# Patient Record
Sex: Male | Born: 1955 | Race: Black or African American | Hispanic: No | Smoking: Current some day smoker
Health system: Southern US, Community
[De-identification: ages and names within clinical notes are randomized; demographics above are authoritative.]

## PROBLEM LIST (undated history)

## (undated) DIAGNOSIS — K0889 Other specified disorders of teeth and supporting structures: Secondary | ICD-10-CM

## (undated) DIAGNOSIS — I10 Essential (primary) hypertension: Secondary | ICD-10-CM

## (undated) DIAGNOSIS — F1020 Alcohol dependence, uncomplicated: Secondary | ICD-10-CM

## (undated) DIAGNOSIS — K219 Gastro-esophageal reflux disease without esophagitis: Secondary | ICD-10-CM

## (undated) DIAGNOSIS — N433 Hydrocele, unspecified: Secondary | ICD-10-CM

## (undated) DIAGNOSIS — F1910 Other psychoactive substance abuse, uncomplicated: Secondary | ICD-10-CM

## (undated) DIAGNOSIS — S129XXA Fracture of neck, unspecified, initial encounter: Secondary | ICD-10-CM

## (undated) DIAGNOSIS — Z8669 Personal history of other diseases of the nervous system and sense organs: Secondary | ICD-10-CM

## (undated) DIAGNOSIS — M1990 Unspecified osteoarthritis, unspecified site: Secondary | ICD-10-CM

## (undated) HISTORY — DX: Other psychoactive substance abuse, uncomplicated: F19.10

## (undated) HISTORY — DX: Hydrocele, unspecified: N43.3

## (undated) HISTORY — DX: Fracture of neck, unspecified, initial encounter: S12.9XXA

## (undated) HISTORY — PX: TESTICLE SURGERY: SHX794

## (undated) HISTORY — DX: Alcohol dependence, uncomplicated: F10.20

## (undated) HISTORY — PX: HEMORRHOID SURGERY: SHX153

---

## 1994-08-05 HISTORY — PX: NECK SURGERY: SHX720

## 2003-09-05 ENCOUNTER — Emergency Department (HOSPITAL_COMMUNITY): Admission: EM | Admit: 2003-09-05 | Discharge: 2003-09-05 | Payer: Self-pay | Admitting: Emergency Medicine

## 2003-09-20 ENCOUNTER — Emergency Department (HOSPITAL_COMMUNITY): Admission: EM | Admit: 2003-09-20 | Discharge: 2003-09-20 | Payer: Self-pay | Admitting: Emergency Medicine

## 2003-09-24 ENCOUNTER — Encounter: Admission: RE | Admit: 2003-09-24 | Discharge: 2003-09-24 | Payer: Self-pay | Admitting: Orthopedic Surgery

## 2003-10-12 ENCOUNTER — Ambulatory Visit (HOSPITAL_BASED_OUTPATIENT_CLINIC_OR_DEPARTMENT_OTHER): Admission: RE | Admit: 2003-10-12 | Discharge: 2003-10-12 | Payer: Self-pay | Admitting: Orthopedic Surgery

## 2003-10-12 ENCOUNTER — Ambulatory Visit (HOSPITAL_COMMUNITY): Admission: RE | Admit: 2003-10-12 | Discharge: 2003-10-12 | Payer: Self-pay | Admitting: Orthopedic Surgery

## 2006-08-30 ENCOUNTER — Emergency Department (HOSPITAL_COMMUNITY): Admission: EM | Admit: 2006-08-30 | Discharge: 2006-08-30 | Payer: Self-pay | Admitting: Emergency Medicine

## 2007-02-11 ENCOUNTER — Emergency Department (HOSPITAL_COMMUNITY): Admission: EM | Admit: 2007-02-11 | Discharge: 2007-02-11 | Payer: Self-pay | Admitting: Emergency Medicine

## 2008-01-06 ENCOUNTER — Emergency Department (HOSPITAL_COMMUNITY): Admission: EM | Admit: 2008-01-06 | Discharge: 2008-01-06 | Payer: Self-pay | Admitting: Emergency Medicine

## 2008-08-06 ENCOUNTER — Emergency Department (HOSPITAL_COMMUNITY): Admission: EM | Admit: 2008-08-06 | Discharge: 2008-08-06 | Payer: Self-pay | Admitting: Emergency Medicine

## 2008-08-19 ENCOUNTER — Ambulatory Visit (HOSPITAL_BASED_OUTPATIENT_CLINIC_OR_DEPARTMENT_OTHER): Admission: RE | Admit: 2008-08-19 | Discharge: 2008-08-19 | Payer: Self-pay | Admitting: Urology

## 2008-08-19 ENCOUNTER — Encounter (INDEPENDENT_AMBULATORY_CARE_PROVIDER_SITE_OTHER): Payer: Self-pay | Admitting: Urology

## 2009-11-22 ENCOUNTER — Emergency Department (HOSPITAL_COMMUNITY): Admission: EM | Admit: 2009-11-22 | Discharge: 2009-11-22 | Payer: Self-pay | Admitting: Emergency Medicine

## 2010-10-23 LAB — URINE MICROSCOPIC-ADD ON

## 2010-10-23 LAB — COMPREHENSIVE METABOLIC PANEL
Albumin: 3.9 g/dL (ref 3.5–5.2)
BUN: 14 mg/dL (ref 6–23)
Chloride: 108 mEq/L (ref 96–112)
Creatinine, Ser: 1.06 mg/dL (ref 0.4–1.5)
GFR calc non Af Amer: >60 mL/min (ref >60)
Glucose, Bld: 85 mg/dL (ref 70–99)
Total Bilirubin: 0.7 mg/dL (ref 0.3–1.2)

## 2010-10-23 LAB — URINALYSIS, ROUTINE W REFLEX MICROSCOPIC
Bilirubin Urine: NEGATIVE
Protein, ur: NEGATIVE mg/dL
Urobilinogen, UA: 0.2 mg/dL (ref 0.0–1.0)

## 2010-10-23 LAB — APTT: aPTT: 29 seconds (ref 24–37)

## 2010-10-23 LAB — CBC
HCT: 38.9 % — ABNORMAL LOW (ref 39.0–52.0)
MCV: 93.9 fL (ref 78.0–100.0)
Platelets: 187 10*3/uL (ref 150–400)
WBC: 4.9 10*3/uL (ref 4.0–10.5)

## 2010-10-23 LAB — DIFFERENTIAL
Basophils Absolute: 0 10*3/uL (ref 0.0–0.1)
Lymphocytes Relative: 38 % (ref 12–46)
Monocytes Absolute: 0.3 10*3/uL (ref 0.1–1.0)
Neutro Abs: 2.7 10*3/uL (ref 1.7–7.7)
Neutrophils Relative %: 54 % (ref 43–77)

## 2010-10-23 LAB — PROTIME-INR
INR: 1.01 (ref 0.00–1.49)
Prothrombin Time: 13.2 seconds (ref 11.6–15.2)

## 2010-10-23 LAB — RAPID URINE DRUG SCREEN, HOSP PERFORMED
Barbiturates: NOT DETECTED
Benzodiazepines: NOT DETECTED
Cocaine: POSITIVE — AB

## 2010-11-19 LAB — RAPID URINE DRUG SCREEN, HOSP PERFORMED: Barbiturates: NOT DETECTED

## 2010-12-18 NOTE — Op Note (Signed)
NAMELOVIE, AGRESTA                ACCOUNT NO.:  [id number]   MEDICAL RECORD NO.:  [id number]           PATIENT TYPE:   LOCATION:                                 FACILITY:   PHYSICIAN:  Ronald L. Earlene Plater, M.D.  DATE OF BIRTH:  Sep 17, 1955   DATE OF PROCEDURE:  08/19/2008  DATE OF DISCHARGE:                               OPERATIVE REPORT   DIAGNOSIS:  Large left hydrocele.   OPERATIVE PROCEDURE:  Left hydrocelectomy.   SURGEON:  Lucrezia Starch. Earlene Plater, M.D.   ANESTHESIA:  LMA.   ESTIMATED BLOOD LOSS:  15 mL.   TUBES:  1/4-inch Penrose drain.   COMPLICATIONS:  None.   INDICATIONS FOR PROCEDURE:  Mr. Johnny [name] is a very nice 55 year old white  male who presented with an enlarging left scrotal mass that had become  somewhat tender and tense.  He underwent ultrasound that revealed  bilateral hydroceles, a very small hydrocele on the right, but a large  hydrocele on the left.  The testicle appeared to be without lesions.  Understanding risks, benefits and alternatives, he has elected to  proceed with the above procedure.   PROCEDURE IN DETAIL:  The patient was placed in the supine position.  After proper LMA anesthesia, he was prepped and draped with Betadine in  a sterile fashion.  A transverse left scrotal incision was made and  sharp dissection was carried down to the tunica vaginalis through dartos  tunic.  This was incised and the straw-colored fluid was drained.  Testicle adnexa were delivered.  There were appendix epididymis and  appendix testes that were cauterized, and 2 small cysts which were  tunical cysts, and the testicle appeared to be benign.  He was noted  have a large thin hydrocele sac.  This was excised to create a skirt  around, preserving the epididymis and cord structures.  It was felt that  plication of this was probably not indicated and it was a very small  amount of hydrocele sac remaining around the edge of the testicle and  the epididymis.  Though irrigation was  performed.  Good hemostasis noted  to be present.  The testicles was noted to be viable.  It was irrigated  and placed into the scrotal sac.  A 1/4-inch Penrose drain was placed  through a separate stab incision and sutured in place with 3-0 chromic  suture.  Dartos tunic was closed then with a running locked 3-0 chromic  suture and the skin was closed with a running horizontal mattress 3-0  chromic catgut.  The wound was dressed with fluffs and scrotal support.  The patient tolerated the procedure well.  No complications.  He was  taken to the recovery room stable.  Hydrocele sac was submitted for  pathologic identification.      Ronald L. Earlene Plater, M.D.  Electronically Signed     RLD/MEDQ  D:  08/19/2008  T:  08/19/2008  Job:  [id number]

## 2010-12-21 NOTE — Op Note (Signed)
NAME:  Johnny [name], Johnny [name]                          ACCOUNT NO.:  [id number]   MEDICAL RECORD NO.:  [id number]                   PATIENT TYPE:  AMB   LOCATION:  DSC                                  FACILITY:  [facility]   PHYSICIAN:  Mila Homer. Sherlean Foot, M.D.              DATE OF BIRTH:  1956-07-29   DATE OF PROCEDURE:  10/12/2003  DATE OF DISCHARGE:                                 OPERATIVE REPORT   PREOPERATIVE DIAGNOSIS:  Right knee medial meniscus tear.   POSTOPERATIVE DIAGNOSES:  1. Right knee medial meniscus tear.  2. Plica syndrome.   PROCEDURE:  Right knee arthroscopy with partial medial meniscectomy and  plica debridement.   SURGEON:  Mila Homer. Sherlean Foot, M.D.   ANESTHESIA:  General LMA.   INDICATION FOR PROCEDURE:  The patient is a 55 year old black male with  mechanical symptoms in the knee and MRI evidence of a posterior horn medial  meniscus tear.  Informed consent was obtained.   DESCRIPTION OF PROCEDURE:  The patient was laid supine and administered  general LMA anesthesia.  The right lower extremity was prepped and draped in  the usual sterile fashion.  A #11 blade, blunt trocar, and cannula were used  to create inferolateral and inferomedial portals.  Diagnostic arthroscopy  revealed a complex oblique tear of the posterior horn of the medial  meniscus.  ACL and PCL were normal.  There was minimal chondromalacia  throughout the knee.  There was a large synovitic hypertrophied plica on the  medial side.  I performed a partial medial meniscectomy with straight and  curved basket forceps and a Biochemist, clinical.  I then went into extension  and performed a plica debridement.  I then obtained hemostasis and irrigated  the knee.  I then closed each portal with a 4-0 nylon stitch, infiltrated  with 10 mL of 0.5% Marcaine with epinephrine in each portal.  I dressed with  Xeroform dressing sponges, sterile Webril, and an Ace wrap.   COMPLICATIONS:  None.   DRAINS:  None.                                             Mila Homer. Sherlean Foot, M.D.    SDL/MEDQ  D:  10/12/2003  T:  10/13/2003  Job:  [id number]

## 2011-04-29 ENCOUNTER — Emergency Department (HOSPITAL_COMMUNITY)
Admission: EM | Admit: 2011-04-29 | Discharge: 2011-04-29 | Disposition: A | Discharge status: Home / Self Care | Payer: Medicare Other | Attending: Emergency Medicine | Admitting: Emergency Medicine

## 2011-04-29 ENCOUNTER — Emergency Department (HOSPITAL_COMMUNITY): Payer: Medicare Other

## 2011-04-29 DIAGNOSIS — R079 Chest pain, unspecified: Secondary | ICD-10-CM | POA: Insufficient documentation

## 2011-04-29 DIAGNOSIS — F17200 Nicotine dependence, unspecified, uncomplicated: Secondary | ICD-10-CM | POA: Insufficient documentation

## 2011-04-29 DIAGNOSIS — I10 Essential (primary) hypertension: Secondary | ICD-10-CM | POA: Insufficient documentation

## 2011-04-29 DIAGNOSIS — F1410 Cocaine abuse, uncomplicated: Secondary | ICD-10-CM | POA: Insufficient documentation

## 2011-04-29 DIAGNOSIS — F1010 Alcohol abuse, uncomplicated: Secondary | ICD-10-CM | POA: Insufficient documentation

## 2011-04-29 LAB — POCT I-STAT TROPONIN I: Troponin i, poc: 0.03 ng/mL (ref 0.00–0.08)

## 2011-05-08 ENCOUNTER — Emergency Department (HOSPITAL_COMMUNITY)
Admission: EM | Admit: 2011-05-08 | Discharge: 2011-05-09 | Disposition: A | Discharge status: Home / Self Care | Payer: Medicare Other | Attending: Emergency Medicine | Admitting: Emergency Medicine

## 2011-05-08 DIAGNOSIS — R29898 Other symptoms and signs involving the musculoskeletal system: Secondary | ICD-10-CM | POA: Insufficient documentation

## 2011-05-08 DIAGNOSIS — M79609 Pain in unspecified limb: Secondary | ICD-10-CM | POA: Insufficient documentation

## 2011-05-08 DIAGNOSIS — I10 Essential (primary) hypertension: Secondary | ICD-10-CM | POA: Insufficient documentation

## 2011-05-08 DIAGNOSIS — R110 Nausea: Secondary | ICD-10-CM | POA: Insufficient documentation

## 2011-05-08 DIAGNOSIS — F1010 Alcohol abuse, uncomplicated: Secondary | ICD-10-CM | POA: Insufficient documentation

## 2011-05-08 DIAGNOSIS — R071 Chest pain on breathing: Secondary | ICD-10-CM | POA: Insufficient documentation

## 2011-05-09 ENCOUNTER — Emergency Department (HOSPITAL_COMMUNITY): Payer: Medicare Other

## 2011-05-09 LAB — LIPASE, BLOOD: Lipase: 26 U/L (ref 11–59)

## 2011-05-09 LAB — CBC
MCH: 30.6 pg (ref 26.0–34.0)
MCHC: 33.3 g/dL (ref 30.0–36.0)
MCV: 91.9 fL (ref 78.0–100.0)
Platelets: 172 10*3/uL (ref 150–400)
RDW: 12.5 % (ref 11.5–15.5)

## 2011-05-09 LAB — POCT I-STAT, CHEM 8
BUN: 11 mg/dL (ref 6–23)
Calcium, Ion: 1.15 mmol/L (ref 1.12–1.32)
Chloride: 106 mEq/L (ref 96–112)
Glucose, Bld: 99 mg/dL (ref 70–99)
TCO2: 21 mmol/L (ref 0–100)

## 2011-05-09 LAB — DIFFERENTIAL
Eosinophils Absolute: 0.1 10*3/uL (ref 0.0–0.7)
Eosinophils Relative: 2 % (ref 0–5)
Lymphs Abs: 2.6 10*3/uL (ref 0.7–4.0)
Monocytes Absolute: 0.5 10*3/uL (ref 0.1–1.0)
Monocytes Relative: 9 % (ref 3–12)

## 2011-05-21 LAB — CBC
HCT: 34.5 — ABNORMAL LOW
Platelets: 188
RDW: 13.4

## 2011-05-21 LAB — DIFFERENTIAL
Lymphocytes Relative: 32
Monocytes Absolute: 0.4
Monocytes Relative: 8
Neutro Abs: 3.1

## 2011-05-21 LAB — COMPREHENSIVE METABOLIC PANEL
Albumin: 3.5
BUN: 11
Creatinine, Ser: 1.05
Potassium: 3.8
Total Protein: 6.9

## 2011-05-21 LAB — URINALYSIS, ROUTINE W REFLEX MICROSCOPIC
Glucose, UA: NEGATIVE
Leukocytes, UA: NEGATIVE
Protein, ur: 30 — AB
Specific Gravity, Urine: 1.021
pH: 5.5

## 2011-05-21 LAB — URINE MICROSCOPIC-ADD ON

## 2013-08-23 ENCOUNTER — Emergency Department (HOSPITAL_COMMUNITY)
Admission: EM | Admit: 2013-08-23 | Discharge: 2013-08-24 | Disposition: A | Discharge status: Home / Self Care | Payer: Medicare Other | Attending: Emergency Medicine | Admitting: Emergency Medicine

## 2013-08-23 ENCOUNTER — Encounter (HOSPITAL_COMMUNITY): Payer: Self-pay | Admitting: Emergency Medicine

## 2013-08-23 DIAGNOSIS — I10 Essential (primary) hypertension: Secondary | ICD-10-CM | POA: Insufficient documentation

## 2013-08-23 DIAGNOSIS — F17200 Nicotine dependence, unspecified, uncomplicated: Secondary | ICD-10-CM | POA: Insufficient documentation

## 2013-08-23 DIAGNOSIS — K2970 Gastritis, unspecified, without bleeding: Secondary | ICD-10-CM

## 2013-08-23 DIAGNOSIS — K2900 Acute gastritis without bleeding: Secondary | ICD-10-CM | POA: Insufficient documentation

## 2013-08-23 DIAGNOSIS — F1010 Alcohol abuse, uncomplicated: Secondary | ICD-10-CM | POA: Insufficient documentation

## 2013-08-23 DIAGNOSIS — R4789 Other speech disturbances: Secondary | ICD-10-CM | POA: Insufficient documentation

## 2013-08-23 DIAGNOSIS — R110 Nausea: Secondary | ICD-10-CM | POA: Insufficient documentation

## 2013-08-23 DIAGNOSIS — N433 Hydrocele, unspecified: Secondary | ICD-10-CM | POA: Insufficient documentation

## 2013-08-23 DIAGNOSIS — F1410 Cocaine abuse, uncomplicated: Secondary | ICD-10-CM | POA: Insufficient documentation

## 2013-08-23 HISTORY — DX: Essential (primary) hypertension: I10

## 2013-08-23 NOTE — ED Notes (Signed)
Per EMS, pt reports RLQ and LUQ abd pain x 6 months worsening in the past week. Pt has hx of ulcers, Pt has hx of ETOH ans Substance "crack" abuse which he admits to tonight. Pt denies nausea and vomiting. CBG 76 Pt alert x4

## 2013-08-23 NOTE — ED Notes (Signed)
PT reports multiple falls in the last 2 weeks. Pt reports falling 2 weeks ago and hitting head. Denies LOC

## 2013-08-24 ENCOUNTER — Emergency Department (HOSPITAL_COMMUNITY): Payer: Medicare Other

## 2013-08-24 LAB — CBC WITH DIFFERENTIAL/PLATELET
BASOS ABS: 0.1 10*3/uL (ref 0.0–0.1)
BASOS PCT: 1 % (ref 0–1)
Eosinophils Absolute: 0 10*3/uL (ref 0.0–0.7)
Eosinophils Relative: 0 % (ref 0–5)
HEMATOCRIT: 36 % — AB (ref 39.0–52.0)
Hemoglobin: 11.9 g/dL — ABNORMAL LOW (ref 13.0–17.0)
LYMPHS PCT: 49 % — AB (ref 12–46)
Lymphs Abs: 2.6 10*3/uL (ref 0.7–4.0)
MCH: 30.6 pg (ref 26.0–34.0)
MCHC: 33.1 g/dL (ref 30.0–36.0)
MCV: 92.5 fL (ref 78.0–100.0)
MONO ABS: 0.3 10*3/uL (ref 0.1–1.0)
Monocytes Relative: 5 % (ref 3–12)
NEUTROS ABS: 2.3 10*3/uL (ref 1.7–7.7)
Neutrophils Relative %: 45 % (ref 43–77)
PLATELETS: 209 10*3/uL (ref 150–400)
RBC: 3.89 MIL/uL — ABNORMAL LOW (ref 4.22–5.81)
RDW: 12.7 % (ref 11.5–15.5)
WBC: 5.2 10*3/uL (ref 4.0–10.5)

## 2013-08-24 LAB — URINALYSIS, ROUTINE W REFLEX MICROSCOPIC
Bilirubin Urine: NEGATIVE
GLUCOSE, UA: NEGATIVE mg/dL
Hgb urine dipstick: NEGATIVE
KETONES UR: NEGATIVE mg/dL
LEUKOCYTES UA: NEGATIVE
NITRITE: NEGATIVE
PROTEIN: NEGATIVE mg/dL
Specific Gravity, Urine: 1.005 (ref 1.005–1.030)
UROBILINOGEN UA: 0.2 mg/dL (ref 0.0–1.0)
pH: 5.5 (ref 5.0–8.0)

## 2013-08-24 LAB — COMPREHENSIVE METABOLIC PANEL
ALBUMIN: 3.6 g/dL (ref 3.5–5.2)
ALT: 10 U/L (ref 0–53)
AST: 16 U/L (ref 0–37)
Alkaline Phosphatase: 95 U/L (ref 39–117)
BILIRUBIN TOTAL: 0.3 mg/dL (ref 0.3–1.2)
BUN: 7 mg/dL (ref 6–23)
CALCIUM: 8.8 mg/dL (ref 8.4–10.5)
CHLORIDE: 105 meq/L (ref 96–112)
CO2: 24 mEq/L (ref 19–32)
CREATININE: 0.86 mg/dL (ref 0.50–1.35)
GFR calc Af Amer: >90 mL/min (ref >90)
GFR calc non Af Amer: >90 mL/min (ref >90)
Glucose, Bld: 93 mg/dL (ref 70–99)
Potassium: 4.2 mEq/L (ref 3.7–5.3)
SODIUM: 144 meq/L (ref 137–147)
Total Protein: 7.8 g/dL (ref 6.0–8.3)

## 2013-08-24 LAB — LIPASE, BLOOD: Lipase: 20 U/L (ref 11–59)

## 2013-08-24 LAB — LACTIC ACID, PLASMA: LACTIC ACID, VENOUS: 1.6 mmol/L (ref 0.5–2.2)

## 2013-08-24 MED ORDER — RANITIDINE HCL 150 MG PO TABS: 150.0000 mg | ORAL_TABLET | Freq: Every day | ORAL | Status: DC

## 2013-08-24 MED ORDER — GI COCKTAIL ~~LOC~~
30.0000 mL | Freq: Once | ORAL | Status: AC
  Administered 2013-08-24: 30 mL via ORAL
  Filled 2013-08-24: qty 30

## 2013-08-24 MED ORDER — SODIUM CHLORIDE 0.9 % IV BOLUS (SEPSIS)
1000.0000 mL | Freq: Once | INTRAVENOUS | Status: AC
  Administered 2013-08-24: 1000 mL via INTRAVENOUS

## 2013-08-24 MED ORDER — IOHEXOL 300 MG/ML  SOLN
80.0000 mL | Freq: Once | INTRAMUSCULAR | Status: AC | PRN
  Administered 2013-08-24: 80 mL via INTRAVENOUS

## 2013-08-24 MED ORDER — IOHEXOL 300 MG/ML  SOLN: 25.0000 mL | Freq: Once | INTRAMUSCULAR | Status: DC | PRN

## 2013-08-24 NOTE — ED Notes (Signed)
Pt given d/c instructions and verbalized understanding. NAD at this time.  

## 2013-08-24 NOTE — ED Notes (Signed)
Pt transported to CT scan.

## 2013-08-24 NOTE — ED Notes (Signed)
Pt returned from ct

## 2013-08-24 NOTE — ED Provider Notes (Signed)
CSN: [id number]     Arrival date & time 08/23/13  2317 History   First MD Initiated Contact with Patient 08/23/13 2356     Chief Complaint  Patient presents with  . Abdominal Pain   (Consider location/radiation/quality/duration/timing/severity/associated sxs/prior Treatment) Patient is a 58 y.o. male presenting with abdominal pain.  Abdominal Pain Pain location:  Epigastric and RLQ Pain quality: sharp   Pain radiates to:  Does not radiate Pain severity:  Severe Onset quality:  Gradual Duration: 6 months, worse tonight. Timing:  Constant Progression:  Worsening Chronicity:  Chronic Context comment:  Alcohol and cocaine use Relieved by:  Nothing Worsened by:  Movement and palpation Ineffective treatments: "pain pills" Associated symptoms: nausea   Associated symptoms: no chest pain, no cough, no diarrhea, no fever, no shortness of breath and no vomiting     Past Medical History  Diagnosis Date  . Hypertension    Past Surgical History  Procedure Laterality Date  . Neck surgery  1996  . Testicle surgery     No family history on file. History  Substance Use Topics  . Smoking status: Current Every Day Smoker  . Smokeless tobacco: Not on file  . Alcohol Use: 50.4 oz/week    84 Cans of beer per week    Review of Systems  Constitutional: Negative for fever.  HENT: Negative for congestion.   Respiratory: Negative for cough and shortness of breath.   Cardiovascular: Negative for chest pain.  Gastrointestinal: Positive for nausea and abdominal pain. Negative for vomiting and diarrhea.  All other systems reviewed and are negative.    Allergies  Review of patient's allergies indicates no known allergies.  Home Medications   Current Outpatient Rx  Name  Route  Sig  Dispense  Refill  . Acetaminophen (PAIN RELIEF PO)   Oral   Take 3-4 tablets by mouth daily as needed (body pain).          BP 154/93  Pulse 94  Temp(Src) 97.9 F (36.6 C) (Oral)  Resp 12  Ht 6'  1" (1.854 m)  Wt 175 lb (79.379 kg)  BMI 23.09 kg/m2  SpO2 97% Physical Exam  Nursing note and vitals reviewed. Constitutional: He is oriented to person, place, and time. He appears well-developed and well-nourished. No distress.  HENT:  Head: Normocephalic and atraumatic.  Mouth/Throat: Oropharynx is clear and moist.  Eyes: Conjunctivae are normal. Pupils are equal, round, and reactive to light. No scleral icterus.  Neck: Neck supple.  Cardiovascular: Normal rate, regular rhythm, normal heart sounds and intact distal pulses.   No murmur heard. Pulmonary/Chest: Effort normal and breath sounds normal. No stridor. No respiratory distress. He has no wheezes. He has no rales.  Abdominal: Soft. He exhibits no distension. There is tenderness in the right upper quadrant, epigastric area and left upper quadrant. Hernia confirmed negative in the right inguinal area and confirmed negative in the left inguinal area.  Genitourinary:    Right testis shows swelling. Right testis shows no tenderness. Left testis shows no swelling and no tenderness. Circumcised. No discharge found.  Musculoskeletal: Normal range of motion. He exhibits no edema.  Neurological: He is alert and oriented to person, place, and time.  Skin: Skin is warm and dry. No rash noted.  Psychiatric: He has a normal mood and affect. His behavior is normal. His speech is slurred (slight).    ED Course  Procedures (including critical care time) Labs Review Labs Reviewed  CBC WITH DIFFERENTIAL - Abnormal; Notable for  the following:    RBC 3.89 (*)    Hemoglobin 11.9 (*)    HCT 36.0 (*)    Lymphocytes Relative 49 (*)    All other components within normal limits  URINALYSIS, ROUTINE W REFLEX MICROSCOPIC  COMPREHENSIVE METABOLIC PANEL  LIPASE, BLOOD  LACTIC ACID, PLASMA   Imaging Review Ct Abdomen Pelvis W Contrast  08/24/2013   CLINICAL DATA:  Right lower quadrant and left upper quadrant abdominal pain. Multiple falls.  EXAM:  CT ABDOMEN AND PELVIS WITH CONTRAST  TECHNIQUE: Multidetector CT imaging of the abdomen and pelvis was performed using the standard protocol following bolus administration of intravenous contrast.  CONTRAST:  80mL OMNIPAQUE IOHEXOL 300 MG/ML  SOLN  COMPARISON:  CT of the abdomen and pelvis performed 11/22/2009  FINDINGS: Minimal bibasilar atelectasis is noted.  The liver and spleen are unremarkable in appearance. The gallbladder is within normal limits. The pancreas and adrenal glands are unremarkable.  The kidneys are unremarkable in appearance. There is no evidence of hydronephrosis. No renal or ureteral stones are seen. No perinephric stranding is appreciated.  No free fluid is identified. The small bowel is unremarkable in appearance. The stomach is within normal limits. No acute vascular abnormalities are seen.  The appendix is normal in caliber and contains air without evidence for appendicitis. Minimal diverticulosis is noted along the descending colon, without evidence of diverticulitis.  The bladder is mildly distended and grossly unremarkable. The prostate is mildly enlarged, measuring 5.3 cm in transverse dimension. No inguinal lymphadenopathy is seen. A relatively large right-sided hydrocele is seen.  No acute osseous abnormalities are identified. Lateral osteophytes are seen along the lower thoracic and lumbar spine.  IMPRESSION: 1. No acute abnormality seen within the abdomen or pelvis. 2. Minimal diverticulosis along the descending colon, without evidence of diverticulitis. 3. Mildly enlarged prostate. 4. Relatively large right-sided hydrocele seen.   Electronically Signed   By: Roanna Raider M.D.   On: 08/24/2013 03:05  All radiology studies independently viewed by me.      EKG Interpretation   None       MDM   1. Gastritis   2. Hydrocele    58 yo male with hx of alcohol abuse presenting with pain in multiple sites which by limited history is chornic, but worse.  Pain located in right  groin and epigastrium primarily.  On exam, tender in epigastrium. No R/R/G.  Has a soft mass in right hemiscrotum.  Possibly a varicocele (has hx of left varicocele for which he had surgery 5 years ago).  Mass not tender.  Less likely hernia.  Plan CT and GI cocktail, labs.    Patient stated he felt much better after GI cocktail. Scrotal mass appears to be a hydrocele. Unclear if this is the cause of his chronic right inguinal pain. Will refer to urology. Will also treat for gastritis with Zantac. Advised he discontinue alcohol.  Candyce Churn, MD 08/24/13 [phone]

## 2013-08-24 NOTE — Discharge Instructions (Signed)
Gastritis, Adult Gastritis is soreness and swelling (inflammation) of the lining of the stomach. Gastritis can develop as a sudden onset (acute) or long-term (chronic) condition. If gastritis is not treated, it can lead to stomach bleeding and ulcers. CAUSES  Gastritis occurs when the stomach lining is weak or damaged. Digestive juices from the stomach then inflame the weakened stomach lining. The stomach lining may be weak or damaged due to viral or bacterial infections. One common bacterial infection is the Helicobacter pylori infection. Gastritis can also result from excessive alcohol consumption, taking certain medicines, or having too much acid in the stomach.  SYMPTOMS  In some cases, there are no symptoms. When symptoms are present, they may include:  Pain or a burning sensation in the upper abdomen.  Nausea.  Vomiting.  An uncomfortable feeling of fullness after eating. DIAGNOSIS  Your caregiver may suspect you have gastritis based on your symptoms and a physical exam. To determine the cause of your gastritis, your caregiver may perform the following:  Blood or stool tests to check for the H pylori bacterium.  Gastroscopy. A thin, flexible tube (endoscope) is passed down the esophagus and into the stomach. The endoscope has a light and camera on the end. Your caregiver uses the endoscope to view the inside of the stomach.  Taking a tissue sample (biopsy) from the stomach to examine under a microscope. TREATMENT  Depending on the cause of your gastritis, medicines may be prescribed. If you have a bacterial infection, such as an H pylori infection, antibiotics may be given. If your gastritis is caused by too much acid in the stomach, H2 blockers or antacids may be given. Your caregiver may recommend that you stop taking aspirin, ibuprofen, or other nonsteroidal anti-inflammatory drugs (NSAIDs). HOME CARE INSTRUCTIONS  Only take over-the-counter or prescription medicines as directed by  your caregiver.  If you were given antibiotic medicines, take them as directed. Finish them even if you start to feel better.  Drink enough fluids to keep your urine clear or pale yellow.  Avoid foods and drinks that make your symptoms worse, such as:  Caffeine or alcoholic drinks.  Chocolate.  Peppermint or mint flavorings.  Garlic and onions.  Spicy foods.  Citrus fruits, such as oranges, lemons, or limes.  Tomato-based foods such as sauce, chili, salsa, and pizza.  Fried and fatty foods.  Eat small, frequent meals instead of large meals. SEEK IMMEDIATE MEDICAL CARE IF:   You have black or dark red stools.  You vomit blood or material that looks like coffee grounds.  You are unable to keep fluids down.  Your abdominal pain gets worse.  You have a fever.  You do not feel better after 1 week.  You have any other questions or concerns. MAKE SURE YOU:  Understand these instructions.  Will watch your condition.  Will get help right away if you are not doing well or get worse. Document Released: 07/16/2001 Document Revised: 01/21/2012 Document Reviewed: 09/04/2011 [facility] Patient Information 2014 [place], [place].  Hydrocele, Adult Fluid can collect around the testicles. This fluid forms in a sac. This condition is called a hydrocele. The collected fluid causes swelling of the scrotum. Usually, it affects just one testicle. Most of the time, the condition does not cause pain. Sometimes, the hydrocele goes away on its own. Other times, surgery is needed to get rid of the fluid. CAUSES A hydrocele does not develop often. Different things can cause a hydrocele in a man, including:  Injury to  the scrotum.  Infection.  X-ray of the area around the scrotum.  A tumor or cancer of the testicle.  Twisting of a testicle.  Decreased blood flow to the scrotum. SYMPTOMS   Swelling without pain. The hydrocele feels like a water-filled balloon.  Swelling with pain.  This can occur if the hydrocele was caused by infection or twisting.  Mild discomfort in the scrotum.  The hydrocele may feel heavy.  Swelling that gets smaller when you lie down. DIAGNOSIS  Your caregiver will do a physical exam to decide if you have a hydrocele. This may include:  Asking questions about your overall health, today and in the past. Your caregiver may ask about any injuries, X-rays, or infections.  Pushing on your abdomen or asking you to change positions to see if the size of the hydrocele changes.  Shining a light through the scrotum (transillumination) to see if the fluid inside the scrotum is clear.  Blood tests and urine tests to check for infection.  Imaging studies that take pictures of the scrotum and testicles. TREATMENT  Treatment depends in part on what caused the condition. Options include:  Watchful waiting. Your caregiver checks the hydrocele every so often.  Different surgeries to drain the fluid.  A needle may be put into the scrotum to drain fluid (needle aspiration). Fluid often returns after this type of treatment.  A cut (incision) may be made in the scrotum to remove the fluid sac (hydrocelectomy).  An incision may be made in the groin to repair a hydrocele that has contact with abdominal fluids (communicating hydrocele).  Medicines to treat an infection (antibiotics). HOME CARE INSTRUCTIONS  What you need to do at home may depend on the cause of the hydrocele and type of treatment. In general:  Take all medicine as directed by your caregiver. Follow the directions carefully.  Ask your caregiver if there is anything you should not do while you recover (activities, lifting, work, sex).  If you had surgery to repair a communicating hydrocele, recovery time may vary. Ask you caregiver about your recovery time.  Avoid heavy lifting for 4 to 6 weeks.  If you had an incision on the scrotum or groin, wash it for 2 to 3 days after surgery. Do  this as long as the skin is closed and there are no gaps in the wound. Wash gently, and avoid rubbing the incision.  Keep all follow-up appointments. SEEK MEDICAL CARE IF:   Your scrotum seems to be getting larger.  The area becomes more and more uncomfortable. SEEK IMMEDIATE MEDICAL CARE IF:  You have a fever. Document Released: 01/09/2010 Document Revised: 05/12/2013 Document Reviewed: 01/09/2010 [facility] Patient Information 2014 [place], [place].

## 2013-09-07 ENCOUNTER — Encounter: Payer: Self-pay | Admitting: Gastroenterology

## 2013-10-04 ENCOUNTER — Encounter: Payer: Self-pay | Admitting: Gastroenterology

## 2013-10-04 ENCOUNTER — Ambulatory Visit (INDEPENDENT_AMBULATORY_CARE_PROVIDER_SITE_OTHER): Payer: Medicare Other | Admitting: Gastroenterology

## 2013-10-04 VITALS — BP 170/90 | HR 82 | Ht 74.0 in | Wt 188.0 lb

## 2013-10-04 DIAGNOSIS — Z1211 Encounter for screening for malignant neoplasm of colon: Secondary | ICD-10-CM

## 2013-10-04 DIAGNOSIS — R109 Unspecified abdominal pain: Secondary | ICD-10-CM

## 2013-10-04 DIAGNOSIS — F1910 Other psychoactive substance abuse, uncomplicated: Secondary | ICD-10-CM

## 2013-10-04 MED ORDER — MOVIPREP 100 G PO SOLR: 1.0000 | Freq: Once | ORAL | Status: DC

## 2013-10-04 NOTE — Progress Notes (Signed)
HPI: This is a  very pleasant 58 year old man who is an alcoholic and crack cocaine addict  He was in the emergency room last month with right groin pain left upper quadrant pain, back pain. Get imaging studies, see the summary below. He had lab tests, see the summary below.  He was referred to see a urologist as well as myself.  Has not seen urologist yet for the hydrocele.  Had hydrocele repair (left side) about 5 years ago.  He has pain in his back  Does not have abd pains except below his belt.    He tends to be constipated.  Had hemorrhoid surgery a long time ago.  No real nausea or vomiting.  He has lost 20 pounds.  Never had colon cancer screening.  His great uncle had colon cancer.   Labs 08/2013 (ER visit): cbc showed Hb 11.9, otherwise CMET, lipase, amylase, UA were all normal.  CT 08/2013 done for RLQ and LUS pain, multiple falls (ER visit): 1. No acute abnormality seen within the abdomen or pelvis.  2. Minimal diverticulosis along the descending colon, without  evidence of diverticulitis.  3. Mildly enlarged prostate.  4. Relatively large right-sided hydrocele seen.   Smokes crack, even just a couple days ago.    Review of systems: Pertinent positive and negative review of systems were noted in the above HPI section. Complete review of systems was performed and was otherwise normal.    Past Medical History  Diagnosis Date  . Hypertension   . Alcoholism   . Drug abuse     crack  . Hydrocele   . Broken neck     Past Surgical History  Procedure Laterality Date  . Neck surgery  1996  . Testicle surgery      Current Outpatient Prescriptions  Medication Sig Dispense Refill  . Acetaminophen (PAIN RELIEF PO) Take 3-4 tablets by mouth daily as needed (body pain).      . ranitidine (ZANTAC) 150 MG tablet Take 1 tablet (150 mg total) by mouth at bedtime.  30 tablet  2   No current facility-administered medications for this visit.    Allergies as of  10/04/2013  . (No Known Allergies)    Family History  Problem Relation Age of Onset  . Colon cancer Paternal Uncle   . Heart disease Mother     father    History   Social History  . Marital Status: Divorced    Spouse Name: N/A    Number of Children: N/A  . Years of Education: N/A   Occupational History  . Not on file.   Social History Main Topics  . Smoking status: Current Every Day Smoker  . Smokeless tobacco: Not on file  . Alcohol Use: 50.4 oz/week    84 Cans of beer per week  . Drug Use: Yes     Comment: "crack"  . Sexual Activity: Not on file   Other Topics Concern  . Not on file   Social History Narrative  . No narrative on file       Physical Exam: BP 170/90  Pulse 82  Ht 6\' 2"  (1.88 m)  Wt 188 lb (85.276 kg)  BMI 24.13 kg/m2 Constitutional: generally well-appearing Psychiatric: alert and oriented x3 Eyes: extraocular movements intact Mouth: oral pharynx moist, no lesions Neck: supple no lymphadenopathy Cardiovascular: heart regular rate and rhythm Lungs: clear to auscultation bilaterally Abdomen: soft, nontender, nondistended, no obvious ascites, no peritoneal signs, normal bowel sounds Extremities: no lower  extremity edema bilaterally Skin: no lesions on visible extremities    Assessment and plan: 58 y.o. male with  intermittent abdominal pain, right hydrocele, polysubstance abuse  He smokes cigarettes, drinks about a 12 pack of beer day and intermittently smokes crack cocaine. I explained to him that these are his top health problems. We are writing down some phone numbers for him to help with his substance abuse issues. The abdominal pains that landed him in the emergency room seemed to improve. Today, he really doesn't have abdominal pains but is still bothered by intermittent right groin pain probably from his right hydrocele, also back pain. He moves his bowels regularly and does not really have any specific GI complaints. I offered him  colon cancer screening with colonoscopy and he was indeed interested in that and so we will proceed at his soonest convenience. I did explain to him that his major health concerns are his addictions and his smoking.

## 2013-10-04 NOTE — Patient Instructions (Addendum)
One of your biggest health concerns is your smoking.  This increases your risk for most cancers and serious cardiovascular diseases such as strokes, heart attacks.  You should try your best to stop.  If you need assistance, please contact your PCP or [facility]ess[facility] [phone]) or [facility] (1-800-QUIT-NOW). You drink too much alcohol, this may be causing or contributing to a lot of your symptoms and it could cause problems such as cirrhosis in the future. You need to try to cut back or quit altogether. You need to stop doing illicit drugs such as crack cocaine. We will look into resources, phone numbers for you to call to help with this. You will be set up for a colonoscopy for colon cancer screening.

## 2013-10-15 ENCOUNTER — Emergency Department (HOSPITAL_COMMUNITY)
Admission: EM | Admit: 2013-10-15 | Discharge: 2013-10-15 | Disposition: A | Discharge status: Home / Self Care | Payer: Medicare Other | Attending: Emergency Medicine | Admitting: Emergency Medicine

## 2013-10-15 ENCOUNTER — Ambulatory Visit: Payer: Medicare Other | Admitting: Gastroenterology

## 2013-10-15 ENCOUNTER — Emergency Department (HOSPITAL_COMMUNITY): Payer: Medicare Other

## 2013-10-15 ENCOUNTER — Encounter (HOSPITAL_COMMUNITY): Payer: Self-pay | Admitting: Emergency Medicine

## 2013-10-15 VITALS — Temp 97.7°F

## 2013-10-15 DIAGNOSIS — F1021 Alcohol dependence, in remission: Secondary | ICD-10-CM | POA: Insufficient documentation

## 2013-10-15 DIAGNOSIS — I10 Essential (primary) hypertension: Secondary | ICD-10-CM | POA: Insufficient documentation

## 2013-10-15 DIAGNOSIS — R51 Headache: Secondary | ICD-10-CM | POA: Insufficient documentation

## 2013-10-15 DIAGNOSIS — R42 Dizziness and giddiness: Secondary | ICD-10-CM | POA: Insufficient documentation

## 2013-10-15 DIAGNOSIS — H9319 Tinnitus, unspecified ear: Secondary | ICD-10-CM | POA: Insufficient documentation

## 2013-10-15 DIAGNOSIS — Z87448 Personal history of other diseases of urinary system: Secondary | ICD-10-CM | POA: Insufficient documentation

## 2013-10-15 DIAGNOSIS — F17200 Nicotine dependence, unspecified, uncomplicated: Secondary | ICD-10-CM | POA: Insufficient documentation

## 2013-10-15 DIAGNOSIS — Z8781 Personal history of (healed) traumatic fracture: Secondary | ICD-10-CM | POA: Insufficient documentation

## 2013-10-15 LAB — URINALYSIS, ROUTINE W REFLEX MICROSCOPIC
BILIRUBIN URINE: NEGATIVE
GLUCOSE, UA: NEGATIVE mg/dL
HGB URINE DIPSTICK: NEGATIVE
Ketones, ur: 15 mg/dL — AB
Leukocytes, UA: NEGATIVE
Nitrite: NEGATIVE
PROTEIN: NEGATIVE mg/dL
Specific Gravity, Urine: 1.026 (ref 1.005–1.030)
Urobilinogen, UA: 0.2 mg/dL (ref 0.0–1.0)
pH: 5 (ref 5.0–8.0)

## 2013-10-15 LAB — COMPREHENSIVE METABOLIC PANEL
ALBUMIN: 4.3 g/dL (ref 3.5–5.2)
ALT: 12 U/L (ref 0–53)
AST: 19 U/L (ref 0–37)
Alkaline Phosphatase: 120 U/L — ABNORMAL HIGH (ref 39–117)
BILIRUBIN TOTAL: 0.5 mg/dL (ref 0.3–1.2)
BUN: 11 mg/dL (ref 6–23)
CHLORIDE: 100 meq/L (ref 96–112)
CO2: 23 mEq/L (ref 19–32)
CREATININE: 1.05 mg/dL (ref 0.50–1.35)
Calcium: 9.5 mg/dL (ref 8.4–10.5)
GFR, EST AFRICAN AMERICAN: 88 mL/min — AB (ref >90)
GFR, EST NON AFRICAN AMERICAN: 76 mL/min — AB (ref >90)
Glucose, Bld: 79 mg/dL (ref 70–99)
Potassium: 4.2 mEq/L (ref 3.7–5.3)
Sodium: 138 mEq/L (ref 137–147)
Total Protein: 8.4 g/dL — ABNORMAL HIGH (ref 6.0–8.3)

## 2013-10-15 LAB — CBC WITH DIFFERENTIAL/PLATELET
Basophils Absolute: 0 10*3/uL (ref 0.0–0.1)
Basophils Relative: 1 % (ref 0–1)
EOS PCT: 0 % (ref 0–5)
Eosinophils Absolute: 0 10*3/uL (ref 0.0–0.7)
HEMATOCRIT: 39.9 % (ref 39.0–52.0)
Hemoglobin: 13.2 g/dL (ref 13.0–17.0)
LYMPHS ABS: 1.6 10*3/uL (ref 0.7–4.0)
Lymphocytes Relative: 31 % (ref 12–46)
MCH: 31.1 pg (ref 26.0–34.0)
MCHC: 33.1 g/dL (ref 30.0–36.0)
MCV: 93.9 fL (ref 78.0–100.0)
MONO ABS: 0.3 10*3/uL (ref 0.1–1.0)
Monocytes Relative: 6 % (ref 3–12)
Neutro Abs: 3.3 10*3/uL (ref 1.7–7.7)
Neutrophils Relative %: 62 % (ref 43–77)
Platelets: 220 10*3/uL (ref 150–400)
RBC: 4.25 MIL/uL (ref 4.22–5.81)
RDW: 12.9 % (ref 11.5–15.5)
WBC: 5.3 10*3/uL (ref 4.0–10.5)

## 2013-10-15 LAB — I-STAT TROPONIN, ED: TROPONIN I, POC: 0 ng/mL (ref 0.00–0.08)

## 2013-10-15 MED ORDER — ACETAMINOPHEN 500 MG PO TABS
1000.0000 mg | ORAL_TABLET | Freq: Once | ORAL | Status: AC
  Administered 2013-10-15: 1000 mg via ORAL
  Filled 2013-10-15: qty 2

## 2013-10-15 MED ORDER — SODIUM CHLORIDE 0.9 % IV SOLN: 500.0000 mL | INTRAVENOUS | Status: DC

## 2013-10-15 NOTE — Progress Notes (Signed)
Smoked crack cocaine last night.  Hypertensive today.  Procedure cancelled.

## 2013-10-15 NOTE — Discharge Instructions (Signed)
Hypertension As your heart beats, it forces blood through your arteries. This force is your blood pressure. If the pressure is too high, it is called hypertension (HTN) or high blood pressure. HTN is dangerous because you may have it and not know it. High blood pressure may mean that your heart has to work harder to pump blood. Your arteries may be narrow or stiff. The extra work puts you at risk for heart disease, stroke, and other problems.  Blood pressure consists of two numbers, a higher number over a lower, 110/72, for example. It is stated as "110 over 72." The ideal is below 120 for the top number (systolic) and under 80 for the bottom (diastolic). Write down your blood pressure today. You should pay close attention to your blood pressure if you have certain conditions such as:  Heart failure.  Prior heart attack.  Diabetes  Chronic kidney disease.  Prior stroke.  Multiple risk factors for heart disease. To see if you have HTN, your blood pressure should be measured while you are seated with your arm held at the level of the heart. It should be measured at least twice. A one-time elevated blood pressure reading (especially in the Emergency Department) does not mean that you need treatment. There may be conditions in which the blood pressure is different between your right and left arms. It is important to see your caregiver soon for a recheck. Most people have essential hypertension which means that there is not a specific cause. This type of high blood pressure may be lowered by changing lifestyle factors such as:  Stress.  Smoking.  Lack of exercise.  Excessive weight.  Drug/tobacco/alcohol use.  Eating less salt. Most people do not have symptoms from high blood pressure until it has caused damage to the body. Effective treatment can often prevent, delay or reduce that damage. TREATMENT  When a cause has been identified, treatment for high blood pressure is directed at the  cause. There are a large number of medications to treat HTN. These fall into several categories, and your caregiver will help you select the medicines that are best for you. Medications may have side effects. You should review side effects with your caregiver. If your blood pressure stays high after you have made lifestyle changes or started on medicines,   Your medication(s) may need to be changed.  Other problems may need to be addressed.  Be certain you understand your prescriptions, and know how and when to take your medicine.  Be sure to follow up with your caregiver within the time frame advised (usually within two weeks) to have your blood pressure rechecked and to review your medications.  If you are taking more than one medicine to lower your blood pressure, make sure you know how and at what times they should be taken. Taking two medicines at the same time can result in blood pressure that is too low. SEEK IMMEDIATE MEDICAL CARE IF:  You develop a severe headache, blurred or changing vision, or confusion.  You have unusual weakness or numbness, or a faint feeling.  You have severe chest or abdominal pain, vomiting, or breathing problems. MAKE SURE YOU:   Understand these instructions.  Will watch your condition.  Will get help right away if you are not doing well or get worse. Document Released: 07/22/2005 Document Revised: 10/14/2011 Document Reviewed: 03/11/2008 [facility] Patient Information 2014 [place].   Emergency Department Resource Guide 1) Find a Doctor and Pay Out of Pocket Although  you won't have to find out who is covered by your insurance plan, it is a good idea to ask around and get recommendations. You will then need to call the office and see if the doctor you have chosen will accept you as a new patient and what types of options they offer for patients who are self-pay. Some doctors offer discounts or will set up payment plans for their patients who do  not have insurance, but you will need to ask so you aren't surprised when you get to your appointment.  2) Contact Your Local Health Department Not all health departments have doctors that can see patients for sick visits, but many do, so it is worth a call to see if yours does. If you don't know where your local health department is, you can check in your phone book. The CDC also has a tool to help you locate your state's health department, and many state websites also have listings of all of their local health departments.  3) Find a [facility] If your illness is not likely to be very severe or complicated, you may want to try a walk in clinic. These are popping up all over the country in pharmacies, drugstores, and shopping centers. They're usually staffed by nurse practitioners or physician assistants that have been trained to treat common illnesses and complaints. They're usually fairly quick and inexpensive. However, if you have serious medical issues or chronic medical problems, these are probably not your best option.  No Primary Care Doctor: - Call Health Connect at  [phone] - they can help you locate a primary care doctor that  accepts your insurance, provides certain services, etc. - Physician Referral Service- [phone]  Chronic Pain Problems: Organization         Address  Phone   Notes  [facility]  [phone] Patients need to be referred by their primary care doctor.   Medication Assistance: Organization         Address  Phone   Notes  [facility] Medication [facility] [place], [place] [address] [phone] --Must be a resident of [facility] -- Must have NO insurance coverage whatsoever (no Medicaid/ Medicare, etc.) -- The pt. MUST have a primary care doctor that directs their care regularly and follows them in the community   [facility]ist  [phone]   [employer]  [phone]    Agencies that  provide inexpensive medical care: Organization         Address  Phone   Notes  [place]  [phone]   [facility]    [phone]   [facility] [place],  [phone]   [place] [address] [place] [phone]   Planned Parenthood    [phone]   [facility]    [phone]   [place] and [address], [place] Phone:  [phone], Fax:  [phone] Hours of Operation:  9 am - 6 pm, M-F.  Also accepts Medicaid/Medicare and self-pay.  [facility] for [address], [address], [place] Phone: [phone], Fax: [phone]. Hours of Operation:  8:30 am - 5:30 pm, M-F.  Also accepts Medicaid and self-pay.  [facility] [place] [address], [place] Phone: [phone]   [place]  [address], [place], [place] [phone], Ext. [phone] Mondays & Thursdays: 7-9 AM.  First 15 patients are seen on a first come, first serve basis. °  ° °Medicaid-accepting [place] Providers: ° °Organization         Address  Phone   Notes  °[name] [address], [place] [phone] Also accepts self-pay patients.  °[facility] [address], [place] ° ([phone]) [phone]   °[facility] [address], [place] [phone]) [phone]   °[facility] [address] [place] [address], [place] ([phone]) [phone]   °Veita Bland [address], [place]  ° ([phone]) [phone] Only accepts [place] Access Medicaid patients after they have their name applied to their card.  ° °Self-Pay (no insurance) in [place]: ° °Organization         Address  Phone   Notes  °Sickle Cell Patients, [facility] [address], [place] [phone]) [phone]   °[facility] [address], [place] ([phone]) [phone]   °[facility] ° [address], [place] ([phone]) [phone]   °[facility]/Dr. Osei-Bonsu ° [address] [place] [address], [place] or [address], [place] [phone] Phone number for both [place] and [place] locations is the same.  °[facility] [address], [place] ([phone]) [phone]   °[facility] [address] [place] [address], [place] or [address] ([phone]) [phone] °([phone]) [phone]   °[facility] [address], [place] ([phone]) [phone], phone; ([phone], fax Sees patients 1st and 3rd Saturday of every month.  Must not qualify for public or private insurance (i.e. Medicaid, Medicare, [place] Health Choice, Veterans' Benefits) • Household income should be no more than 200% of the poverty level •The clinic cannot treat you if you are pregnant or think you are pregnant • Sexually transmitted diseases are not treated at the clinic.  ° ° °Dental Care: °Organization         Address  Phone  Notes  °[place] [facility] [address], [place] ([phone]) [phone] Accepts children up to age 21 who are enrolled in Medicaid or [place] Health Choice; pregnant women with a Medicaid card; and children who have applied for Medicaid or [place] Health Choice, but were declined, whose parents can pay a reduced fee at time of service.  °[place] [facility] [place]  [address], [place] ([phone]) [phone] Accepts children up to age 21 who are enrolled in Medicaid or [place] Health Choice; pregnant women with a Medicaid card; and children who have applied for Medicaid or [place] Health Choice, but were declined, whose parents can pay a reduced fee at time of service.  °[facility] ° [address],  ([phone]) [phone] Patients are seen by appointment only. Walk-ins  are not accepted. [facility] will see patients 18 years of age and older. °Monday - Tuesday (8am-5pm) °Most Wednesdays (8:30-5pm) °$30 per visit, cash only  °[facility] ° [address], [place] [phone]) [phone] Patients are seen by appointment only. Walk-ins are not accepted. [facility] will see patients 18 years of age and older. °One Wednesday Evening (Monthly: Volunteer Based).  $30 per visit, cash only  °[facility]   [employer] [facility] [employer]  [phone] for adults; Children under age 30, call Graduate Pediatric Dentistry at [phone]. Children aged 38-14, please call [phone] to request a pediatric application.  Dental services are provided in all areas of dental care including fillings, crowns and bridges, complete and partial dentures, implants, gum treatment, root canals, and extractions. Preventive care is also provided. Treatment is provided to both adults and children. Patients are selected via a lottery and there is often a waiting list.   [facility] [address], [place]  [phone] [email or web address]   [facility] [address] [place], [place] [phone], Ext. [phone] Second and Fourth Thursday of each month, opens at 6:30 AM; Clinic ends at 9 AM.  Patients are seen on a first-come first-served basis, and a limited number are seen during each clinic.   [facility]  [address] Hillard Danker [place], [place] [phone]   Eligibility Requirements You must have lived in [place], [place], or [place] counties for at least the last three months.   You cannot be eligible for state or federal sponsored [employer], including [employer], [place], or [employer].   You generally cannot be eligible for healthcare insurance through your employer.    How to apply: Eligibility screenings are held every Tuesday and Wednesday afternoon from 1:00 pm until 4:00 pm. You do not need an appointment  for the interview!  [facility] [address], [place], [place]   [place]  Anton [place]iz Department  [place]  [phone]    Behavioral Health Resources in the Community: Intensive Outpatient Programs Organization         Address  Phone  Notes  [place]. [address] [place], [place] [phone]   [facility] [address], [place], [place]   ADS: [facility] [address] [place], [place]   [place] [address]  [place], [place] or [phone]   Substance Abuse Resources Organization         Address  Phone  Notes  [facility]  ([phone])[phone]   [place]  [phone]) [phone]   [facility] [place]   Chinita Pester  [phone]   Residential & Outpatient Substance Abuse Program  [phone]   Psychological Services Organization         Address  Phone  Notes  [facility] [place]  [place]  [phone]   [place] [address], [place] or [phone]    Mobile Crisis Teams Organization         Address  Phone  Notes  [facility], Mobile Crisis Care Unit  [phone]   [facility]  [address] [place], [place]   [place] [address] [place] [phone]    Self-Help/Support Groups Organization         Address  Phone             Notes  [place]. of [place] - variety of support groups  Sellers Call for more information  Narcotics Anonymous (NA), [facility] [address], [place]  2 meetings at this location   Special educational needs teacher  Address  Phone  Notes  [facility]  [facility] [address],    [place]  1-(217) [phone]   [facility]  [address], [place] [address], [place], [place]   Salcha Rogers, [place] [phone] Admissions: 8am-3pm M-F  Incentives Substance [place] [address],    [place], [place] [phone]   [facility] [address] [place], [place], [place]   [facility] [address]  [place], [place]   [facility] - Intensive Outpatient [address], [place], [place]   [facility] [place].,  [place], [place] [phone]-(223) [phone] or [phone]   [facility] ([facility]) [address] [place], [place] Accepts Medicaid  [facility] [place] [address]  [place] [phone] Substance Abuse/Addiction Treatment   [facility] Organization         Address  Phone  Notes  [facility]  [phone]   Domenic Schwab, PhD [address] Arlis Porta [place], [place]   ([phone] or [phone]   [place], [place] [phone]   [facility] [address] [place], [place] [phone] Insurance/Medicaid/sponsorship through [facility] and Families [address]                                    [place], [place] [phone] [place] [address], [place] [phone]    Dr. Adele Schilder  [phone]   [facility] Dept. 1) [address] [place] 2) [place] 3)  [address] [address], [place] [phone]  [phone]   [place] [phone] or [phone] (After Hours)

## 2013-10-15 NOTE — Progress Notes (Signed)
Procedure cancelled pt. Admitted to CRNA that he used cocaine last night. CRNA informed pt. That it would not be safe to sedate him for procedure. Dr. Christella HartiganJacobs notified.

## 2013-10-15 NOTE — Progress Notes (Signed)
1535 Dr Christella Hartiganjacobs in to speak with pt.  Dr Christella HartiganJacobs talked with pt about elevated blood pressure and drug use and informed pt that his office would get him set up with a primary care physician and would be calling him with appointment for blood pressure. Pt verbalized understanding and stated [facility] if he could get something for his blood pressure upon leaving here.

## 2013-10-15 NOTE — ED Provider Notes (Addendum)
CSN: [id number]     Arrival date & time 10/15/13  1601 History   First MD Initiated Contact with Patient 10/15/13 1839     Chief Complaint  Patient presents with  . Hypertension     (Consider location/radiation/quality/duration/timing/severity/associated sxs/prior Treatment) HPI Comments: Sent after colonoscopy was cancelled due to HTN. Stated some HA and dizziness earlier, brief spinning sensation of dizziness, and a brief L central, non-radiating sharp chest pain that lasted roughly a few seconds. Feeling better now. Admits to using cocaine a few days ago.  Patient is a 58 y.o. male presenting with hypertension. The history is provided by the patient.  Hypertension This is a chronic problem. The current episode started more than 1 week ago. The problem occurs constantly. The problem has not changed since onset.Associated symptoms include headaches. Pertinent negatives include no shortness of breath. Nothing aggravates the symptoms. Nothing relieves the symptoms.    Past Medical History  Diagnosis Date  . Hypertension   . Alcoholism   . Drug abuse     crack  . Hydrocele   . Broken neck    Past Surgical History  Procedure Laterality Date  . Neck surgery  1996  . Testicle surgery     Family History  Problem Relation Age of Onset  . Colon cancer Paternal Uncle   . Heart disease Mother     father   History  Substance Use Topics  . Smoking status: Current Every Day Smoker  . Smokeless tobacco: Not on file  . Alcohol Use: 50.4 oz/week    84 Cans of beer per week    Review of Systems  Constitutional: Negative for fever.  Respiratory: Negative for cough and shortness of breath.   Neurological: Positive for headaches.  All other systems reviewed and are negative.      Allergies  Review of patient's allergies indicates no known allergies.  Home Medications   Current Outpatient Rx  Name  Route  Sig  Dispense  Refill  . ranitidine (ZANTAC) 150 MG tablet   Oral  Take 150 mg by mouth at bedtime as needed for heartburn.          BP 147/91  Pulse 74  Temp(Src) 97.4 F (36.3 C) (Oral)  Resp 20  SpO2 100% Physical Exam  Nursing note and vitals reviewed. Constitutional: He is oriented to person, place, and time. He appears well-developed and well-nourished. No distress.  HENT:  Head: Normocephalic and atraumatic.  Mouth/Throat: No oropharyngeal exudate.  Eyes: EOM are normal. Pupils are equal, round, and reactive to light.  Neck: Normal range of motion. Neck supple.  Cardiovascular: Normal rate and regular rhythm.  Exam reveals no friction rub.   No murmur heard. Pulmonary/Chest: Effort normal and breath sounds normal. No respiratory distress. He has no wheezes. He has no rales.  Abdominal: He exhibits no distension. There is no tenderness. There is no rebound.  Musculoskeletal: Normal range of motion. He exhibits no edema.  Neurological: He is alert and oriented to person, place, and time.  Skin: He is not diaphoretic.    ED Course  Procedures (including critical care time) Labs Review Labs Reviewed  URINALYSIS, ROUTINE W REFLEX MICROSCOPIC - Abnormal; Notable for the following:    Ketones, ur 15 (*)    All other components within normal limits  COMPREHENSIVE METABOLIC PANEL - Abnormal; Notable for the following:    Total Protein 8.4 (*)    Alkaline Phosphatase 120 (*)    GFR calc non Af Denyse Dago  76 (*)    GFR calc Af Amer 88 (*)    All other components within normal limits  CBC WITH DIFFERENTIAL  I-STAT TROPOININ, ED   Imaging Review Ct Head Wo Contrast  10/15/2013   CLINICAL DATA:  Headache and left-sided tingling; hypertension  EXAM: CT HEAD WITHOUT CONTRAST  TECHNIQUE: Contiguous axial images were obtained from the base of the skull through the vertex without intravenous contrast. Study was obtained within 24 hr of patient's arrival at the emergency department.  COMPARISON:  None.  FINDINGS: The ventricles are normal in size and  configuration. There is no mass, hemorrhage, extra-axial fluid collection, or midline shift. The gray-white compartments appear normal. No acute infarct is seen on this study. Both middle cerebral arteries show slight increased attenuation in a symmetric manner. The significance of this finding is questionable.  Bony calvarium appears intact. Visualized mastoid air cells are clear. There is mucosal thickening in both maxillary antra, considerably more on the left than on the right. There is extensive erosive change in the superior alveolar ridge, particularly on the left.  IMPRESSION: No intracranial mass, hemorrhage, or acute appearing infarct. No focal gray-white compartment lesions are identified.  Extensive superior alveolar ridge erosion, particularly on the left. Maxillary sinus disease bilaterally, considerably more on the left than on the right.   Electronically Signed   By: Bretta BangWilliam  Woodruff M.D.   On: 10/15/2013 19:56     EKG Interpretation None      Date: 10/15/2013  Rate: 82  Rhythm: normal sinus rhythm  QRS Axis: normal  Intervals: normal  ST/T Wave abnormalities: normal  Conduction Disutrbances:none  Narrative Interpretation:   Old EKG Reviewed: unchanged   MDM   Final diagnoses:  Hypertension    58 year old male presented hypertension. Had a colonoscopy canceled today due to hypertension. He did smoke crack cocaine last night. He wants something for his high blood pressure. He also reported some dizziness and ears ringing. Dizziness began today but is definitely improved. It is described as spinning. His ears ringing is been on for a month. He also described a few seconds of sharp central chest pain that has never had before that did not radiate. Having a L sided headache, no vision changes, been present for several days, he thinks it is a migraine. No thunderclap onset, [facility]. Patient here with stable vitals. He sitting in bed comfortably. He states his headache  is gone and his dizziness is gone. Patient feels lightheaded earlier. Patient is to counseled extensively on not using cocaine.Marland Kitchen. He does have a normal head CT and normal labs and normal troponin and normal EKG. Will give resource guide. Stable for discharge. Last BP much improved - 148/82.    Dagmar HaitWilliam Tasharra Nodine, MD 10/15/13 2053  Dagmar HaitWilliam Kenishia Plack, MD 10/16/13 [id number]

## 2013-10-15 NOTE — ED Notes (Signed)
The pt was seen at [facility] and sent here  For high bp.  Ears ringing

## 2014-03-14 ENCOUNTER — Other Ambulatory Visit: Payer: Self-pay | Admitting: Urology

## 2014-04-07 ENCOUNTER — Encounter (HOSPITAL_BASED_OUTPATIENT_CLINIC_OR_DEPARTMENT_OTHER): Payer: Self-pay | Admitting: *Deleted

## 2014-04-07 NOTE — Progress Notes (Signed)
Pt instructed npo p mn 9/10 x zantac w sip of water.  To [facility] 9/11 @ 0830.  Needs istat,ekg,drug screen on arrival.

## 2014-04-14 NOTE — Anesthesia Preprocedure Evaluation (Addendum)
Anesthesia Evaluation  Patient identified by MRN, date of birth, ID band Patient awake    Reviewed: Allergy & Precautions, H&P , NPO status , Patient's Chart, lab work & pertinent test results  History of Anesthesia Complications Negative for: history of anesthetic complications  Airway Mallampati: II TM Distance: >3 FB Neck ROM: Full    Dental  (+) Poor Dentition, Chipped, Missing,    Pulmonary Current Smoker,  breath sounds clear to auscultation  Pulmonary exam normal       Cardiovascular hypertension, Rhythm:Regular Rate:Normal     Neuro/Psych negative neurological ROS  negative psych ROS   GI/Hepatic negative GI ROS, Neg liver ROS, GERD-  Medicated and Controlled,(+)     substance abuse  alcohol use and cocaine use, He reports that the last use of crack cocaine was 2 weeks prior to today. He denies any signs or symptoms of withdrawal. He reports daily ETOH use of a 40oz and 6-8 can beers. He last drank beer yesterday afternoon   Endo/Other  negative endocrine ROS  Renal/GU negative Renal ROS  negative genitourinary   Musculoskeletal  (+) Arthritis -,   Abdominal   Peds negative pediatric ROS (+)  Hematology negative hematology ROS (+)   Anesthesia Other Findings   Reproductive/Obstetrics negative OB ROS                          Anesthesia Physical Anesthesia Plan  ASA: II  Anesthesia Plan: General   Post-op Pain Management:    Induction: Intravenous  Airway Management Planned: LMA  Additional Equipment:   Intra-op Plan:   Post-operative Plan: Extubation in OR  Informed Consent: I have reviewed the patients History and Physical, chart, labs and discussed the procedure including the risks, benefits and alternatives for the proposed anesthesia with the patient or authorized representative who has indicated his/her understanding and acceptance.   Dental advisory given  Plan  Discussed with: CRNA  Anesthesia Plan Comments:         Anesthesia Quick Evaluation

## 2014-04-15 ENCOUNTER — Encounter (HOSPITAL_BASED_OUTPATIENT_CLINIC_OR_DEPARTMENT_OTHER)
Admission: RE | Disposition: A | Discharge status: Home / Self Care | Payer: Medicare Other | Source: Ambulatory Visit | Attending: Urology

## 2014-04-15 ENCOUNTER — Ambulatory Visit (HOSPITAL_BASED_OUTPATIENT_CLINIC_OR_DEPARTMENT_OTHER): Payer: Medicare Other | Admitting: Anesthesiology

## 2014-04-15 ENCOUNTER — Encounter (HOSPITAL_BASED_OUTPATIENT_CLINIC_OR_DEPARTMENT_OTHER): Payer: Medicare Other | Admitting: Anesthesiology

## 2014-04-15 ENCOUNTER — Ambulatory Visit (HOSPITAL_BASED_OUTPATIENT_CLINIC_OR_DEPARTMENT_OTHER)
Admission: RE | Admit: 2014-04-15 | Discharge: 2014-04-15 | Disposition: A | Discharge status: Home / Self Care | Payer: Medicare Other | Source: Ambulatory Visit | Attending: Urology | Admitting: Urology

## 2014-04-15 ENCOUNTER — Encounter (HOSPITAL_BASED_OUTPATIENT_CLINIC_OR_DEPARTMENT_OTHER): Payer: Self-pay | Admitting: Urology

## 2014-04-15 DIAGNOSIS — N433 Hydrocele, unspecified: Secondary | ICD-10-CM | POA: Insufficient documentation

## 2014-04-15 DIAGNOSIS — F411 Generalized anxiety disorder: Secondary | ICD-10-CM | POA: Diagnosis not present

## 2014-04-15 DIAGNOSIS — F17200 Nicotine dependence, unspecified, uncomplicated: Secondary | ICD-10-CM | POA: Diagnosis not present

## 2014-04-15 DIAGNOSIS — J45909 Unspecified asthma, uncomplicated: Secondary | ICD-10-CM | POA: Diagnosis not present

## 2014-04-15 DIAGNOSIS — F1410 Cocaine abuse, uncomplicated: Secondary | ICD-10-CM | POA: Diagnosis not present

## 2014-04-15 DIAGNOSIS — F1010 Alcohol abuse, uncomplicated: Secondary | ICD-10-CM | POA: Insufficient documentation

## 2014-04-15 DIAGNOSIS — M1990 Unspecified osteoarthritis, unspecified site: Secondary | ICD-10-CM | POA: Insufficient documentation

## 2014-04-15 DIAGNOSIS — K219 Gastro-esophageal reflux disease without esophagitis: Secondary | ICD-10-CM | POA: Diagnosis not present

## 2014-04-15 DIAGNOSIS — I10 Essential (primary) hypertension: Secondary | ICD-10-CM | POA: Diagnosis not present

## 2014-04-15 HISTORY — DX: Gastro-esophageal reflux disease without esophagitis: K21.9

## 2014-04-15 HISTORY — DX: Personal history of other diseases of the nervous system and sense organs: Z86.69

## 2014-04-15 HISTORY — PX: HYDROCELE EXCISION: SHX482

## 2014-04-15 HISTORY — DX: Unspecified osteoarthritis, unspecified site: M19.90

## 2014-04-15 HISTORY — DX: Other specified disorders of teeth and supporting structures: K08.89

## 2014-04-15 LAB — POCT I-STAT 4, (NA,K, GLUC, HGB,HCT)
GLUCOSE: 88 mg/dL (ref 70–99)
HCT: 38 % — ABNORMAL LOW (ref 39.0–52.0)
Hemoglobin: 12.9 g/dL — ABNORMAL LOW (ref 13.0–17.0)
POTASSIUM: 4.6 meq/L (ref 3.7–5.3)
SODIUM: 138 meq/L (ref 137–147)

## 2014-04-15 LAB — RAPID URINE DRUG SCREEN, HOSP PERFORMED
Amphetamines: NOT DETECTED
BENZODIAZEPINES: NOT DETECTED
Barbiturates: NOT DETECTED
Cocaine: NOT DETECTED
Opiates: NOT DETECTED
Tetrahydrocannabinol: NOT DETECTED

## 2014-04-15 SURGERY — HYDROCELECTOMY
Anesthesia: General | Site: Scrotum | Laterality: Right

## 2014-04-15 MED ORDER — MIDAZOLAM HCL 5 MG/5ML IJ SOLN
INTRAMUSCULAR | Status: DC | PRN
  Administered 2014-04-15: 2 mg via INTRAVENOUS

## 2014-04-15 MED ORDER — PROMETHAZINE HCL 25 MG/ML IJ SOLN
6.2500 mg | INTRAMUSCULAR | Status: DC | PRN
  Filled 2014-04-15: qty 1

## 2014-04-15 MED ORDER — OXYCODONE HCL 5 MG PO TABS
5.0000 mg | ORAL_TABLET | ORAL | Status: DC | PRN
  Administered 2014-04-15: 5 mg via ORAL
  Filled 2014-04-15: qty 1

## 2014-04-15 MED ORDER — FENTANYL CITRATE 0.05 MG/ML IJ SOLN
INTRAMUSCULAR | Status: DC | PRN
  Administered 2014-04-15 (×2): 50 ug via INTRAVENOUS

## 2014-04-15 MED ORDER — LIDOCAINE HCL (CARDIAC) 20 MG/ML IV SOLN
INTRAVENOUS | Status: DC | PRN
  Administered 2014-04-15: 100 mg via INTRAVENOUS

## 2014-04-15 MED ORDER — ACETAMINOPHEN 10 MG/ML IV SOLN
INTRAVENOUS | Status: DC | PRN
  Administered 2014-04-15: 1000 mg via INTRAVENOUS

## 2014-04-15 MED ORDER — EPHEDRINE SULFATE 50 MG/ML IJ SOLN
INTRAMUSCULAR | Status: DC | PRN
  Administered 2014-04-15 (×2): 10 mg via INTRAVENOUS

## 2014-04-15 MED ORDER — CEFAZOLIN SODIUM 1-5 GM-% IV SOLN
1.0000 g | INTRAVENOUS | Status: AC
  Administered 2014-04-15: 2 g via INTRAVENOUS
  Filled 2014-04-15: qty 50

## 2014-04-15 MED ORDER — ONDANSETRON HCL 4 MG/2ML IJ SOLN
INTRAMUSCULAR | Status: DC | PRN
  Administered 2014-04-15: 4 mg via INTRAVENOUS

## 2014-04-15 MED ORDER — FENTANYL CITRATE 0.05 MG/ML IJ SOLN
25.0000 ug | INTRAMUSCULAR | Status: DC | PRN
  Administered 2014-04-15: 25 ug via INTRAVENOUS
  Filled 2014-04-15: qty 1

## 2014-04-15 MED ORDER — DOCUSATE SODIUM 100 MG PO CAPS: 100.0000 mg | ORAL_CAPSULE | Freq: Two times a day (BID) | ORAL | Status: DC | PRN

## 2014-04-15 MED ORDER — OXYCODONE HCL 5 MG PO TABS: 5.0000 mg | ORAL_TABLET | ORAL | Status: DC | PRN

## 2014-04-15 MED ORDER — MEPERIDINE HCL 25 MG/ML IJ SOLN
6.2500 mg | INTRAMUSCULAR | Status: DC | PRN
  Filled 2014-04-15: qty 1

## 2014-04-15 MED ORDER — OXYCODONE HCL 5 MG PO TABS
ORAL_TABLET | ORAL | Status: AC
  Filled 2014-04-15: qty 1

## 2014-04-15 MED ORDER — SODIUM CHLORIDE 0.9 % IR SOLN
Status: DC | PRN
  Administered 2014-04-15: 500 mL

## 2014-04-15 MED ORDER — FENTANYL CITRATE 0.05 MG/ML IJ SOLN
INTRAMUSCULAR | Status: AC
  Filled 2014-04-15: qty 6

## 2014-04-15 MED ORDER — LACTATED RINGERS IV SOLN
INTRAVENOUS | Status: DC
  Administered 2014-04-15 (×2): via INTRAVENOUS
  Filled 2014-04-15: qty 1000

## 2014-04-15 MED ORDER — PROPOFOL 10 MG/ML IV BOLUS
INTRAVENOUS | Status: DC | PRN
  Administered 2014-04-15: 200 mg via INTRAVENOUS

## 2014-04-15 MED ORDER — MIDAZOLAM HCL 2 MG/2ML IJ SOLN
INTRAMUSCULAR | Status: AC
  Filled 2014-04-15: qty 2

## 2014-04-15 MED ORDER — BUPIVACAINE HCL (PF) 0.25 % IJ SOLN
INTRAMUSCULAR | Status: DC | PRN
  Administered 2014-04-15: 20 mL

## 2014-04-15 MED ORDER — FENTANYL CITRATE 0.05 MG/ML IJ SOLN
INTRAMUSCULAR | Status: AC
  Filled 2014-04-15: qty 2

## 2014-04-15 MED ORDER — KETOROLAC TROMETHAMINE 30 MG/ML IJ SOLN
INTRAMUSCULAR | Status: DC | PRN
  Administered 2014-04-15: 30 mg via INTRAVENOUS

## 2014-04-15 MED ORDER — DEXAMETHASONE SODIUM PHOSPHATE 4 MG/ML IJ SOLN
INTRAMUSCULAR | Status: DC | PRN
  Administered 2014-04-15: 10 mg via INTRAVENOUS

## 2014-04-15 SURGICAL SUPPLY — 57 items
ADH SKN CLS APL DERMABOND .7 (GAUZE/BANDAGES/DRESSINGS) ×1
BLADE CLIPPER SURG (BLADE) ×2 IMPLANT
BLADE SURG 15 STRL LF DISP TIS (BLADE) ×1 IMPLANT
BLADE SURG 15 STRL SS (BLADE) ×3
BNDG GAUZE ELAST 4 BULKY (GAUZE/BANDAGES/DRESSINGS) ×3 IMPLANT
BRIEF STRETCH FOR OB PAD LRG (UNDERPADS AND DIAPERS) ×3 IMPLANT
CANISTER SUCTION 1200CC (MISCELLANEOUS) ×2 IMPLANT
CANISTER SUCTION 2500CC (MISCELLANEOUS) IMPLANT
CLEANER CAUTERY TIP 5X5 PAD (MISCELLANEOUS) ×1 IMPLANT
CLOTH BEACON ORANGE TIMEOUT ST (SAFETY) ×2 IMPLANT
COVER MAYO STAND STRL (DRAPES) ×3 IMPLANT
COVER TABLE BACK 60X90 (DRAPES) ×3 IMPLANT
DERMABOND ADVANCED (GAUZE/BANDAGES/DRESSINGS) ×2
DERMABOND ADVANCED .7 DNX12 (GAUZE/BANDAGES/DRESSINGS) ×1 IMPLANT
DISSECTOR ROUND CHERRY 3/8 STR (MISCELLANEOUS) IMPLANT
DRAIN PENROSE 18X1/4 LTX STRL (WOUND CARE) IMPLANT
DRAPE PED LAPAROTOMY (DRAPES) ×3 IMPLANT
ELECT NDL BLADE 2-5/6 (NEEDLE) ×1 IMPLANT
ELECT NEEDLE BLADE 2-5/6 (NEEDLE) ×3 IMPLANT
ELECT REM PT RETURN 9FT ADLT (ELECTROSURGICAL) ×3
ELECTRODE REM PT RTRN 9FT ADLT (ELECTROSURGICAL) ×1 IMPLANT
GAUZE SPONGE 4X4 16PLY XRAY LF (GAUZE/BANDAGES/DRESSINGS) IMPLANT
GLOVE BIO SURGEON STRL SZ 6.5 (GLOVE) ×1 IMPLANT
GLOVE BIO SURGEON STRL SZ7.5 (GLOVE) ×5 IMPLANT
GLOVE BIO SURGEONS STRL SZ 6.5 (GLOVE) ×1
GLOVE BIOGEL PI IND STRL 6.5 (GLOVE) IMPLANT
GLOVE BIOGEL PI IND STRL 7.5 (GLOVE) IMPLANT
GLOVE BIOGEL PI INDICATOR 6.5 (GLOVE) ×2
GLOVE BIOGEL PI INDICATOR 7.5 (GLOVE) ×2
GLOVE INDICATOR 8.0 STRL GRN (GLOVE) ×2 IMPLANT
GOWN PREVENTION PLUS LG XLONG (DISPOSABLE) ×1 IMPLANT
GOWN STRL REIN XL XLG (GOWN DISPOSABLE) ×1 IMPLANT
GOWN STRL REUS W/ TWL LRG LVL3 (GOWN DISPOSABLE) IMPLANT
GOWN STRL REUS W/ TWL XL LVL3 (GOWN DISPOSABLE) IMPLANT
GOWN STRL REUS W/TWL LRG LVL3 (GOWN DISPOSABLE) ×8 IMPLANT
GOWN STRL REUS W/TWL XL LVL3 (GOWN DISPOSABLE) ×5 IMPLANT
NEEDLE HYPO 22GX1.5 SAFETY (NEEDLE) ×2 IMPLANT
NS IRRIG 500ML POUR BTL (IV SOLUTION) ×3 IMPLANT
PACK BASIN DAY SURGERY FS (CUSTOM PROCEDURE TRAY) ×3 IMPLANT
PAD CLEANER CAUTERY TIP 5X5 (MISCELLANEOUS) ×2
PENCIL BUTTON HOLSTER BLD 10FT (ELECTRODE) ×3 IMPLANT
SPONGE GAUZE 4X4 12PLY STER LF (GAUZE/BANDAGES/DRESSINGS) ×2 IMPLANT
SUT MNCRL AB 4-0 PS2 18 (SUTURE) ×2 IMPLANT
SUT VIC AB 3-0 54X BRD REEL (SUTURE) IMPLANT
SUT VIC AB 3-0 BRD 54 (SUTURE) ×3
SUT VIC AB 3-0 SH 27 (SUTURE) ×6
SUT VIC AB 3-0 SH 27X BRD (SUTURE) ×2 IMPLANT
SUT VICRYL 3 0 BR 18  UND (SUTURE) ×2
SUT VICRYL 3 0 BR 18 UND (SUTURE) ×1 IMPLANT
SYR BULB IRRIGATION 50ML (SYRINGE) ×3 IMPLANT
SYR CONTROL 10ML LL (SYRINGE) ×3 IMPLANT
TOWEL OR 17X24 6PK STRL BLUE (TOWEL DISPOSABLE) ×6 IMPLANT
TRAY DSU PREP LF (CUSTOM PROCEDURE TRAY) ×3 IMPLANT
TUBE CONNECTING 12'X1/4 (SUCTIONS) ×1
TUBE CONNECTING 12X1/4 (SUCTIONS) ×2 IMPLANT
WATER STERILE IRR 500ML POUR (IV SOLUTION) ×1 IMPLANT
YANKAUER SUCT BULB TIP NO VENT (SUCTIONS) ×3 IMPLANT

## 2014-04-15 NOTE — Op Note (Signed)
Preoperative diagnosis:  1. right hydrocele   Postoperative diagnosis:  1. left hydrocele  Procedure:       right hydrocelectomy  Surgeon: Crist Fat, MD Resident Assistant: Dr. Harlene Salts, MD  Anesthesia: General  Complications: None  Intraoperative findings: ~250cc right hydrocele  EBL: Minimal  Specimens: None  Indication: Indication: Johnny [name] is a 58 y.o. patient with symptomatic right hydrocele.  After reviewing the management options for treatment, he elected to proceed with the above surgical procedure(s). We have discussed the potential benefits and risks of the procedure, side effects of the proposed treatment, the likelihood of the patient achieving the goals of the procedure, and any potential problems that might occur during the procedure or recuperation. Informed consent has been obtained.  Description of procedure:  The patient was taken to the operating room and general anesthesia was induced. The patient was placed on the table in supine position, general anesthesia was then induced and an LMA inserted. The scrotum was then prepped and draped in the routine sterile fashion. A timeout was then held with confirmation of antibiotics.  I then made a midline incision through the scrotal median raphae through the skin and into the dartos. Once through several layers the dartos was able to get the right testicle and contents out of the right  hemiscrotum and into the surgical field.   The hydrocele sac was then dissected out removing the overlying layers of the dartos tunica.  The sac was then opened with Metzenbaum scissors and the fluid drained from the sac.  The opening was then continued so that the entire sac was bivalved.  The edges were then removed, leaving a small edge of tissue around the testicle.  The edge of the remaining sac was cauterized.  The edge was then everted, brought together around the posterior aspect of the testicle and closed in a  running fashion using 3-0  Vicryl.  The distal aspect was left open to prevent strangulating the cord.  Meticulous hemostasis was then achieved. The right hemiscrotum was then copiously irrigated and a final check for hemostasis performed. I then closed the dartos with a 3-0 Vicryl in a running/locking stitch. I closed the skin with a 4-0 Monocryl in a vertical mattress running fashion. I then injected 10 cc of quarter percent Marcaine into the incision, and then placed Dermabond over the incision. A fluff dressing and mesh underpants were then applied area.  The patient tolerated the procedure without any perioperative complications. At the end of the case all last needles and sponges had been accounted for. The patient was returned to the PACU in excellent condition.   Crist Fat, M.D.

## 2014-04-15 NOTE — Discharge Instructions (Signed)
Discharge instructions following scrotal surgery ° °Call your doctor for: °· Fever is greater than 100.5 °· Severe nausea or vomiting °· Increasing pain not controlled by pain medication °· Increasing redness or drainage from incisions ° °The number for questions or concerns is [phone] ° °Activity level: °No lifting greater than 10 pounds (about equal to milk) for the next 2 weeks or until cleared to do so at follow-up appointment.  Otherwise activity as tolerated by comfort level. ° °Diet: °May resume your regular diet as tolerated ° °Driving: °No driving while still taking opiate pain medications (weight at least 6-8 hours after last dose).  No driving if you still sore from surgery as it may limit her ability to react quickly if necessary. ° ° °Shower/bath: °May shower and get incision wet pad dry immediately following.  Do not scrub vigorously for the next 2-3 weeks.  Do not soak incision (ID soaking in bath or swimming) until told he may do so by Dr., as this may promote a wound infection. ° °Wound care: °He may cover wounds with sterile gauze as needed to prevent incisions rubbing on close follow-up in any seepage.  Where tight fitting underpants for at least 2 weeks.  He should apply cold compresses (ice or sac of frozen peas/corn) to your scrotum for at least 48 hours to reduce the swelling.  You should expect that his scrotum will swell up initially and then get smaller over the next 2-4 weeks. ° °Follow-up appointments: °Follow-up appointment will be scheduled with Dr. Herrick in 6 weeks for a wound check. ° ° °Post Anesthesia Home Care Instructions ° °Activity: °Get plenty of rest for the remainder of the day. A responsible adult should stay with you for 24 hours following the procedure.  °For the next 24 hours, DO NOT: °-Drive a car °-Operate machinery °-Drink alcoholic beverages °-Take any medication unless instructed by your physician °-Make any legal decisions or sign important  papers. ° °Meals: °Start with liquid foods such as gelatin or soup. Progress to regular foods as tolerated. Avoid greasy, spicy, heavy foods. If nausea and/or vomiting occur, drink only clear liquids until the nausea and/or vomiting subsides. Call your physician if vomiting continues. ° °Special Instructions/Symptoms: °Your throat may feel dry or sore from the anesthesia or the breathing tube placed in your throat during surgery. If this causes discomfort, gargle with warm salt water. The discomfort should disappear within 24 hours. ° °

## 2014-04-15 NOTE — H&P (Signed)
Reason For Visit Right hydrocele   History of Present Illness This is a 58 year old male previously seen by Dr. Earlene Plater for a left hydrocele repair who presents today for evaluation of a right hydrocele. In 2010 he was noted to have bilateral hydroceles, left greater than right. The left hydrocele was fixed by Dr. Earlene Plater. Patient presents today for follow-up for 5 years later. The patient has noted that his right scrotum is significantly larger than it was 5 years ago. The patient had difficulty walking and occasionally falls, and impinges his scrotum when he falls. It occasionally interferes with intercourse. He is describing his pain as discomfort and annoyance. He denies any voiding symptoms. He denies any history of epididymitis or otitis.   Past Medical History Problems  1. History of Anxiety (300.00) 2. History of Arthritis (V13.4) 3. History of Asthma (493.90) 4. History of depression (V11.8) 5. History of heartburn (V12.79) 6. History of hypertension (V12.59) 7. History of Hydrocele of testis (603.9)  Surgical History Problems  1. History of Neck Surgery 2. History of Surg Tunica Vaginalis Puncture Aspiration Hydrocele Left  Current Meds 1. No Reported Medications  Requested for: 06Aug2015 Recorded 2. Vitamin D (Ergocalciferol) 50000 UNIT Oral Capsule; TAKE 1 CAPSULE WEEKLY;  Therapy: 12Mar2010 to (Last Rx:12Mar2010) Ordered 3. Vitamin D 1000 UNIT Oral Capsule; Take one tablet twice a day. qty 60;  Therapy: 12Mar2010 to (Evaluate:08Oct2010); Last Rx:12Mar2010 Ordered  Allergies Medication  1. No Known Drug Allergies  Family History Problems  1. Family history of Aortic Aneurysm : Father 2. Family history of Family Health Status Number Of Children   4 sons and 6 daughters 3. Family history of Hypertension (V17.49) : Father 4. Family history of Renal Failure : Mother  Social History Problems    Alcohol Use   48 oz a day; 6-7 quarts   Marital History - Single    Tobacco Use (V15.82)  Review of Systems Genitourinary, constitutional, skin, eye, otolaryngeal, hematologic/lymphatic, cardiovascular, pulmonary, endocrine, musculoskeletal, gastrointestinal, neurological and psychiatric system(s) were reviewed and pertinent findings if present are noted.  Gastrointestinal: heartburn.    Vitals Vital Signs [Data Includes: Last 1 Day]  Recorded: 06Aug2015 10:25AM  Height: 6 ft 2 in Weight: 180 lb  BMI Calculated: 23.11 BSA Calculated: 2.08 Blood Pressure: 175 / 103 Temperature: 97.7 F Heart Rate: 93  Physical Exam Constitutional: Well nourished and well developed . No acute distress.  ENT:. The ears and nose are normal in appearance.  Pulmonary: No respiratory distress, normal respiratory rhythm and effort and clear bilateral breath sounds.  Cardiovascular: Heart rate and rhythm are normal . The arterial pulses are normal. No peripheral edema.  Genitourinary: Examination of the right scrotum demonstrates a hydrocele. Examination of the left scrotum demostrates no hydrocele. Right hemiscrotum demonstrates transillumination with washout consistent with hydrocele.  Skin: Normal skin turgor, no visible rash and no visible skin lesions.    Results/Data  The following images/tracing/specimen were independently visualized: Marland Kitchen Scrotal ultrasound on 08/06/08  Diagnostic report text    Clinical Data: Left-sided testicular pain. ULTRASOUND OF SCROTUM Technique: Complete ultrasound examination of the testicles, epididymis, and other scrotal structures was performed. DOPPLER ULTRASOUND OF SCROTAL VESSELS Technique: Color and duplex Doppler ultrasound was utilized to evaluate blood flow to the testicles and scrotal contents. Comparison: None Findings: Right testis 4.1 x 3.0 x 2.3 cm. Left testis 4.7 x 2.8 x 2.3 cm. Normal color and spectral Doppler to bilateral testicles. Normal gray scale appearance. Both epididymi are normal in appearance. Large  bilateral hydroceles.  Larger on the left than right. No evidence of varicocele.     Patient's urinalysis today revealed no evidence of infection hemorrhage, or hematuria.    Assessment Assessed  1. Right hydrocele (603.9)  Symptomatic right hydrocele   Discussion/Summary I discussed the treatment options for hydrocele with the patient including conservative management and monitoring, aspiration and sclerosis, and hydrocelectomy. I recommended that he undergo hydrocelectomy given the risk of recurrence with aspiration and sclerosis and given that he also is symptomatic. I discussed the operation in detail including the risks and benefits. We'll get this booked at the patient's convenience.

## 2014-04-15 NOTE — Interval H&P Note (Signed)
History and Physical Interval Note:  04/15/2014 9:55 AM  Johnny [name]  has presented today for surgery, with the diagnosis of RIGHT HYDROCELE  The various methods of treatment have been discussed with the patient and family. After consideration of risks, benefits and other options for treatment, the patient has consented to  Procedure(s): RIGHT HYDROCELECTOMY ADULT (Right) as a surgical intervention .  The patient's history has been reviewed, patient examined, no change in status, stable for surgery.  I have reviewed the patient's chart and labs.  Questions were answered to the patient's satisfaction.     Berniece Salines W

## 2014-04-15 NOTE — Transfer of Care (Signed)
Immediate Anesthesia Transfer of Care Note  Patient: Johnny [name]  Procedure(s) Performed: Procedure(s) (LRB): RIGHT HYDROCELECTOMY ADULT (Right)  Patient Location: PACU  Anesthesia Type: General  Level of Consciousness: awake, alert  and oriented  Airway & Oxygen Therapy: Patient Spontanous Breathing and Patient connected to face mask oxygen  Post-op Assessment: Report given to PACU RN and Post -op Vital signs reviewed and stable  Post vital signs: Reviewed and stable  Complications: No apparent anesthesia complications

## 2014-04-15 NOTE — Anesthesia Procedure Notes (Signed)
Procedure Name: LMA Insertion Date/Time: 04/15/2014 10:15 AM Performed by: Norva Pavlov Pre-anesthesia Checklist: Patient identified, Emergency Drugs available, Suction available and Patient being monitored Patient Re-evaluated:Patient Re-evaluated prior to inductionOxygen Delivery Method: Circle System Utilized Preoxygenation: Pre-oxygenation with 100% oxygen Intubation Type: IV induction Ventilation: Mask ventilation without difficulty LMA: LMA inserted LMA Size: 4.0 Number of attempts: 1 Airway Equipment and Method: bite block Placement Confirmation: positive ETCO2 Tube secured with: Tape Dental Injury: Teeth and Oropharynx as per pre-operative assessment

## 2014-04-18 ENCOUNTER — Encounter (HOSPITAL_BASED_OUTPATIENT_CLINIC_OR_DEPARTMENT_OTHER): Payer: Self-pay | Admitting: Urology

## 2014-04-18 NOTE — Anesthesia Postprocedure Evaluation (Signed)
  Anesthesia Post-op Note  Patient: Johnny [name]  Procedure(s) Performed: Procedure(s) (LRB): RIGHT HYDROCELECTOMY ADULT (Right)  Patient Location: PACU  Anesthesia Type: General  Level of Consciousness: awake and alert   Airway and Oxygen Therapy: Patient Spontanous Breathing  Post-op Pain: mild  Post-op Assessment: Post-op Vital signs reviewed, Patient's Cardiovascular Status Stable, Respiratory Function Stable, Patent Airway and No signs of Nausea or vomiting  Last Vitals:  Filed Vitals:   04/15/14 1302  BP: 155/89  Pulse: 77  Temp: 36.4 C  Resp: 16    Post-op Vital Signs: stable   Complications: No apparent anesthesia complications

## 2016-06-23 ENCOUNTER — Emergency Department (HOSPITAL_COMMUNITY)
Admission: EM | Admit: 2016-06-23 | Discharge: 2016-06-23 | Disposition: A | Discharge status: Home / Self Care | Payer: Medicare Other | Attending: Emergency Medicine | Admitting: Emergency Medicine

## 2016-06-23 ENCOUNTER — Encounter (HOSPITAL_COMMUNITY): Payer: Self-pay

## 2016-06-23 ENCOUNTER — Emergency Department (HOSPITAL_COMMUNITY): Payer: Medicare Other

## 2016-06-23 DIAGNOSIS — I10 Essential (primary) hypertension: Secondary | ICD-10-CM | POA: Diagnosis not present

## 2016-06-23 DIAGNOSIS — M7989 Other specified soft tissue disorders: Secondary | ICD-10-CM | POA: Diagnosis present

## 2016-06-23 DIAGNOSIS — F17210 Nicotine dependence, cigarettes, uncomplicated: Secondary | ICD-10-CM | POA: Insufficient documentation

## 2016-06-23 DIAGNOSIS — Y929 Unspecified place or not applicable: Secondary | ICD-10-CM | POA: Insufficient documentation

## 2016-06-23 DIAGNOSIS — M25551 Pain in right hip: Secondary | ICD-10-CM | POA: Insufficient documentation

## 2016-06-23 DIAGNOSIS — W01198A Fall on same level from slipping, tripping and stumbling with subsequent striking against other object, initial encounter: Secondary | ICD-10-CM | POA: Diagnosis not present

## 2016-06-23 DIAGNOSIS — M25561 Pain in right knee: Secondary | ICD-10-CM | POA: Insufficient documentation

## 2016-06-23 DIAGNOSIS — Y9301 Activity, walking, marching and hiking: Secondary | ICD-10-CM | POA: Diagnosis not present

## 2016-06-23 DIAGNOSIS — Y999 Unspecified external cause status: Secondary | ICD-10-CM | POA: Insufficient documentation

## 2016-06-23 DIAGNOSIS — M25569 Pain in unspecified knee: Secondary | ICD-10-CM

## 2016-06-23 LAB — I-STAT CHEM 8, ED
BUN: 19 mg/dL (ref 6–20)
CHLORIDE: 110 mmol/L (ref 101–111)
Calcium, Ion: 1.21 mmol/L (ref 1.15–1.40)
Creatinine, Ser: 1.1 mg/dL (ref 0.61–1.24)
GLUCOSE: 88 mg/dL (ref 65–99)
HCT: 28 % — ABNORMAL LOW (ref 39.0–52.0)
Hemoglobin: 9.5 g/dL — ABNORMAL LOW (ref 13.0–17.0)
Potassium: 4.2 mmol/L (ref 3.5–5.1)
Sodium: 142 mmol/L (ref 135–145)
TCO2: 25 mmol/L (ref 0–100)

## 2016-06-23 MED ORDER — OXYCODONE-ACETAMINOPHEN 5-325 MG PO TABS
2.0000 | ORAL_TABLET | Freq: Once | ORAL | Status: AC
  Administered 2016-06-23: 2 via ORAL
  Filled 2016-06-23: qty 2

## 2016-06-23 MED ORDER — OXYCODONE-ACETAMINOPHEN 5-325 MG PO TABS
1.0000 | ORAL_TABLET | Freq: Once | ORAL | Status: AC
  Administered 2016-06-23: 1 via ORAL
  Filled 2016-06-23: qty 1

## 2016-06-23 MED ORDER — METHOCARBAMOL 500 MG PO TABS
500.0000 mg | ORAL_TABLET | Freq: Once | ORAL | Status: AC
  Administered 2016-06-23: 500 mg via ORAL
  Filled 2016-06-23: qty 1

## 2016-06-23 MED ORDER — OXYCODONE-ACETAMINOPHEN 5-325 MG PO TABS: 2.0000 | ORAL_TABLET | ORAL | 0 refills | Status: DC | PRN

## 2016-06-23 NOTE — Discharge Instructions (Signed)

## 2016-06-23 NOTE — ED Notes (Signed)
Patient transported to CT 

## 2016-06-23 NOTE — ED Provider Notes (Signed)
Emergency Department Provider Note   I have reviewed the triage vital signs and the nursing notes.   HISTORY  Chief Complaint Fall and Leg Swelling   HPI Johnny [name] is a 60 y.o. male with PMH of EtOH use, GERD, and HTN presents to the emergency department for evaluation of right hip and knee pain in the setting of fall one week ago. The patient was walking across a wet concrete floor when he slipped and fell. He reports landing on his right side. States that he did hit his head at that time but no loss of consciousness. He has had persistent right hip and knee pain since the fall. He was given ibuprofen 800 mg by his daughter which she's been taking with minimal relief in symptoms. He also notes bilateral lower extremity edema which is new. He denies any chest pain or dyspnea. No abdominal pain. No neck pain or headache. No numbness or tingling.    Past Medical History:  Diagnosis Date  . Alcoholism (HCC)   . Arthritis   . Broken neck (HCC)   . Drug abuse    crack  . GERD (gastroesophageal reflux disease)   . H/O tinnitus   . Hydrocele   . Hypertension    no meds  . Loose, teeth     There are no active problems to display for this patient.   Past Surgical History:  Procedure Laterality Date  . HEMORRHOID SURGERY     as a teenager  . HYDROCELE EXCISION Right 04/15/2014   Procedure: RIGHT HYDROCELECTOMY ADULT;  Surgeon: Crist Fat, MD;  Location: [facility];  Service: Urology;  Laterality: Right;  . NECK SURGERY  1996  . TESTICLE SURGERY      Current Outpatient Rx  . Order #: [id number] Class: Historical Med  . Order #: [id number] Class: Print  . Order #: [id number] Class: Print  . Order #: [id number] Class: Print    Allergies Patient has no known allergies.  Family History  Problem Relation Age of Onset  . Colon cancer Paternal Uncle   . Heart disease Mother     father    Social History Social History  Substance Use Topics  .  Smoking status: Current Every Day Smoker    Packs/day: 0.25    Types: Cigarettes  . Smokeless tobacco: Not on file  . Alcohol use 50.4 oz/week    84 Cans of beer per week    Review of Systems  Constitutional: No fever/chills Eyes: No visual changes. ENT: No sore throat. Cardiovascular: Denies chest pain. Respiratory: Denies shortness of breath. Gastrointestinal: No abdominal pain.  No nausea, no vomiting.  No diarrhea.  No constipation. Genitourinary: Negative for dysuria. No testicular pain.  Musculoskeletal: Negative for back pain. Positive right hip and knee pain.  Skin: Negative for rash. Neurological: Negative for headaches, focal weakness or numbness.  10-point ROS otherwise negative.  ____________________________________________   PHYSICAL EXAM:  VITAL SIGNS: ED Triage Vitals  Enc Vitals Group     BP 06/23/16 0928 169/94     Pulse Rate 06/23/16 0928 87     Resp 06/23/16 0928 17     Temp 06/23/16 0928 98.1 F (36.7 C)     Temp Source 06/23/16 0928 Oral     SpO2 06/23/16 0928 100 %     Pain Score 06/23/16 0932 10   Constitutional: Alert and oriented. Appears uncomfortable especially with movement of the right hip/knee.  Eyes: Conjunctivae are normal.  Head: Atraumatic. Nose: No  congestion/rhinnorhea. Mouth/Throat: Mucous membranes are moist.   Neck: No stridor.   Cardiovascular: Normal rate, regular rhythm. Good peripheral circulation. Grossly normal heart sounds.   Respiratory: Normal respiratory effort.  No retractions. Lungs CTAB. Gastrointestinal: Soft and nontender. No distention. No inguinal masses Musculoskeletal: Tenderness to palpation over the right knee and thigh. Muscle are contracted and the right knee is flexed with pain on ROM. No overlying erythema or warmth. Patient is ambulatory with moderate pain.  Neurologic:  Normal speech and language. No gross focal neurologic deficits are appreciated.  Skin:  Skin is warm, dry and intact. No rash  noted.   ____________________________________________   LABS (all labs ordered are listed, but only abnormal results are displayed)  Labs Reviewed  I-STAT CHEM 8, ED - Abnormal; Notable for the following:       Result Value   Hemoglobin 9.5 (*)    HCT 28.0 (*)    All other components within normal limits    ____________________________________________  RADIOLOGY  Ct Hip Right Wo Contrast  Result Date: 06/23/2016 CLINICAL DATA:  Worsening right hip pain for 5 days since a fall. Initial encounter. EXAM: CT OF THE RIGHT HIP WITHOUT CONTRAST TECHNIQUE: Multidetector CT imaging of the right hip was performed according to the standard protocol. Multiplanar CT image reconstructions were also generated. COMPARISON:  Plain films right hip this same day. FINDINGS: Bones/Joint/Cartilage There is no fracture or dislocation. Small subchondral cysts are seen in the right acetabulum. Small accessory ossicle along the acetabular roof anteriorly is also noted. Ligaments Suboptimally assessed by CT. Muscles and Tendons Unremarkable. Soft tissues Unremarkable. IMPRESSION: No acute abnormality.  Mild appearing right hip osteoarthritis. Electronically Signed   By: Drusilla Kannerhomas  Dalessio M.D.   On: 06/23/2016 15:35   Dg Knee Complete 4 Views Right  Result Date: 06/23/2016 CLINICAL DATA:  Bilateral knee pain after fall 1 week ago EXAM: RIGHT KNEE - COMPLETE 4+ VIEW COMPARISON:  09/24/2003 knee MRI FINDINGS: Limited study as all images were obtained with knee in flexed position. No joint effusion or fracture. No malalignment or focal bone lesion. IMPRESSION: 1. Limited study due to atypical positioning from persistently flexed knee. 2. No detected abnormality. Electronically Signed   By: Marnee SpringJonathon  Watts M.D.   On: 06/23/2016 11:45   Dg Hip Unilat W Or Wo Pelvis 2-3 Views Right  Result Date: 06/23/2016 CLINICAL DATA:  Bilateral knee pain and right hip pain after fall 1 week ago. EXAM: DG HIP (WITH OR WITHOUT  PELVIS) 2-3V RIGHT COMPARISON:  None. FINDINGS: Mild symmetric degenerative changes in the hips bilaterally. SI joints are symmetric and unremarkable. No acute bony abnormality. Specifically, no fracture, subluxation, or dislocation. Soft tissues are intact. IMPRESSION: No acute bony abnormality. Electronically Signed   By: Charlett NoseKevin  Dover M.D.   On: 06/23/2016 11:44    ____________________________________________   PROCEDURES  Procedure(s) performed:   Procedures  None ____________________________________________   INITIAL IMPRESSION / ASSESSMENT AND PLAN / ED COURSE  Pertinent labs & imaging results that were available during my care of the patient were reviewed by me and considered in my medical decision making (see chart for details).  Patient presents to the emergency department for evaluation of right-sided hip and knee pain. On exam he has some bony tenderness over both the lateral knee and right hip. He has diffuse muscle tightness throughout the right lower leg. No midline spine tenderness. Very low suspicion for spinal fracture or spinal stenosis. No evidence to suspect septic joint. Plan for plain films  of the hip and knee. We'll give muscle relaxer and obtain i-STAT Chem-8  01:08 PM Patient with continued moderate pain especially in the right hip. I have some clinical suspicion for occult fracture not appreciated on x-ray. Plan for CT scan of the hip and reevaluation.   15:40 PM CT with only mild hip arthrosis. No fracture appreciated. No large joint effusion. Plan for pain control at home and PCP follow up with consideration if PT/OT in the future if conservative mgmt does not improve symptoms.   At this time, I do not feel there is any life-threatening condition present. I have reviewed and discussed all results (EKG, imaging, lab, urine as appropriate), exam findings with patient. I have reviewed nursing notes and appropriate previous records.  I feel the patient is safe to  be discharged home without further emergent workup. Discussed usual and customary return precautions. Patient and family (if present) verbalize understanding and are comfortable with this plan.  Patient will follow-up with their primary care provider. If they do not have a primary care provider, information for follow-up has been provided to them. All questions have been answered.  ____________________________________________  FINAL CLINICAL IMPRESSION(S) / ED DIAGNOSES  Final diagnoses:  Knee pain  Right hip pain     MEDICATIONS GIVEN DURING THIS VISIT:  Medications  methocarbamol (ROBAXIN) tablet 500 mg (500 mg Oral Given 06/23/16 1034)  oxyCODONE-acetaminophen (PERCOCET/ROXICET) 5-325 MG per tablet 1 tablet (1 tablet Oral Given 06/23/16 1152)  oxyCODONE-acetaminophen (PERCOCET/ROXICET) 5-325 MG per tablet 2 tablet (2 tablets Oral Given 06/23/16 1654)     NEW OUTPATIENT MEDICATIONS STARTED DURING THIS VISIT:  Discharge Medication List as of 06/23/2016  4:35 PM    START taking these medications   Details  oxyCODONE-acetaminophen (PERCOCET/ROXICET) 5-325 MG tablet Take 2 tablets by mouth every 4 (four) hours as needed for severe pain., Starting Sun 06/23/2016, Print        Note:  This document was prepared using Dragon voice recognition software and may include unintentional dictation errors.  Alona BeneJoshua Long, MD Emergency Medicine   Maia PlanJoshua G Long, MD 06/24/16 [phone]

## 2016-06-23 NOTE — ED Triage Notes (Addendum)
Patient states that he slipped and fell 1 week ago. Fell indoors on [employer] floor. Complains of bilateral leg pain with swelling and right sided back pain. Taking family members pain meds with no relief. Reports old back injury and thinks may be related. [facility] Alert and oriented

## 2016-06-26 ENCOUNTER — Encounter (HOSPITAL_COMMUNITY): Payer: Self-pay | Admitting: *Deleted

## 2016-06-26 ENCOUNTER — Emergency Department (HOSPITAL_COMMUNITY)
Admission: EM | Admit: 2016-06-26 | Discharge: 2016-06-26 | Disposition: A | Discharge status: Home / Self Care | Payer: Medicare Other | Attending: Emergency Medicine | Admitting: Emergency Medicine

## 2016-06-26 DIAGNOSIS — Y9389 Activity, other specified: Secondary | ICD-10-CM | POA: Insufficient documentation

## 2016-06-26 DIAGNOSIS — Y92009 Unspecified place in unspecified non-institutional (private) residence as the place of occurrence of the external cause: Secondary | ICD-10-CM | POA: Diagnosis not present

## 2016-06-26 DIAGNOSIS — W010XXA Fall on same level from slipping, tripping and stumbling without subsequent striking against object, initial encounter: Secondary | ICD-10-CM | POA: Diagnosis not present

## 2016-06-26 DIAGNOSIS — I10 Essential (primary) hypertension: Secondary | ICD-10-CM | POA: Diagnosis not present

## 2016-06-26 DIAGNOSIS — F17210 Nicotine dependence, cigarettes, uncomplicated: Secondary | ICD-10-CM | POA: Insufficient documentation

## 2016-06-26 DIAGNOSIS — M25551 Pain in right hip: Secondary | ICD-10-CM | POA: Insufficient documentation

## 2016-06-26 DIAGNOSIS — Y999 Unspecified external cause status: Secondary | ICD-10-CM | POA: Diagnosis not present

## 2016-06-26 MED ORDER — OXYCODONE-ACETAMINOPHEN 5-325 MG PO TABS
2.0000 | ORAL_TABLET | Freq: Once | ORAL | Status: AC
  Administered 2016-06-26: 2 via ORAL
  Filled 2016-06-26: qty 2

## 2016-06-26 NOTE — Discharge Planning (Signed)
[facility] not paying for percocet.  With pt having insurance coverage, he is not [facility].

## 2016-06-26 NOTE — ED Triage Notes (Signed)
Pt arrived by [facility]. Was seen here two weeks ago for a fall, having right side back pain and leg pain since. Hx of neck fracture with residual right side weakness. Pt was prescribed pain meds but unable to afford the prescription and having difficulty ambulating.

## 2016-06-26 NOTE — Discharge Instructions (Signed)
You need to fill the percocet prescription from prior visit.   This is $4 with your medicare. Follow-up with Dr. Charlann Boxerlin (orthopedics) if you continue having issues with your hip. Return to the ED for new or worsening symptoms.

## 2016-06-26 NOTE — ED Provider Notes (Signed)
[facility]-EMERGENCY DEPT Provider Note   CSN: [id number] Arrival date & time: 06/26/16  [id number]     History   Chief Complaint Chief Complaint  Patient presents with  . Fall  . Pain    HPI Johnny [name] is a 60 y.o. male.  The history is provided by the patient and medical records.    60 year old male with history of alcoholism, arthritis, GERD, hypertension, presenting to the ED for right hip pain.  Patient was seen here 3 days ago after a fall in his home. States he lives with his younger brother and elderly mother who is battling dementia. States he hurt his mother crying for help and so he got out of bed quickly and slipped and fell on some water on a concrete floor. He reports he landed on his right side and hit his left elbow on the door. States he was seen here and had x-rays of his knees and hips as well as a CT of his right hip without any evidence of fracture. States when he left here he was feeling somewhat better because of the pain medicine, however he was unable to afford the pain medicine at home. States she is continue to have persistent pain in the right hip. States pain is debilitating, making it very hard for him to walk around, even with the assistance of his cane.  States his brother has been helping him get around the house and care for himself.  He denies any new injuries, trauma, or falls.  Past Medical History:  Diagnosis Date  . Alcoholism (HCC)   . Arthritis   . Broken neck (HCC)   . Drug abuse    crack  . GERD (gastroesophageal reflux disease)   . H/O tinnitus   . Hydrocele   . Hypertension    no meds  . Loose, teeth     There are no active problems to display for this patient.   Past Surgical History:  Procedure Laterality Date  . HEMORRHOID SURGERY     as a teenager  . HYDROCELE EXCISION Right 04/15/2014   Procedure: RIGHT HYDROCELECTOMY ADULT;  Surgeon: Crist FatBenjamin W Herrick, MD;  [facility] ;  Service: Urology;  Laterality:  Right;  . NECK SURGERY  1996  . TESTICLE SURGERY         Home Medications    Prior to Admission medications   Medication Sig Start Date End Date Taking? Authorizing Provider  docusate sodium (COLACE) 100 MG capsule Take 1 capsule (100 mg total) by mouth 2 (two) times daily as needed (take to keep stool soft.). Patient not taking: Reported on 06/23/2016 04/15/14   Crist FatBenjamin W Herrick, MD  ibuprofen (ADVIL,MOTRIN) 800 MG tablet Take 400-800 mg by mouth every 8 (eight) hours as needed for headache or moderate pain.    Historical Provider, MD  oxyCODONE (ROXICODONE) 5 MG immediate release tablet Take 1 tablet (5 mg total) by mouth every 4 (four) hours as needed. Patient not taking: Reported on 06/23/2016 04/15/14   Crist FatBenjamin W Herrick, MD  oxyCODONE-acetaminophen (PERCOCET/ROXICET) 5-325 MG tablet Take 2 tablets by mouth every 4 (four) hours as needed for severe pain. 06/23/16   Maia PlanJoshua G Long, MD    Family History Family History  Problem Relation Age of Onset  . Colon cancer Paternal Uncle   . Heart disease Mother     father    Social History Social History  Substance Use Topics  . Smoking status: Current Every Day Smoker  Packs/day: 0.25    Types: Cigarettes  . Smokeless tobacco: Not on file  . Alcohol use 50.4 oz/week    84 Cans of beer per week     Allergies   Patient has no known allergies.   Review of Systems Review of Systems  Musculoskeletal: Positive for arthralgias.  All other systems reviewed and are negative.    Physical Exam Updated Vital Signs BP (!) 166/101   Pulse 94   Temp 97.9 F (36.6 C) (Oral)   Resp 22   SpO2 100%   Physical Exam  Constitutional: He is oriented to person, place, and time. He appears well-developed and well-nourished.  HENT:  Head: Normocephalic and atraumatic.  Mouth/Throat: Oropharynx is clear and moist.  Eyes: Conjunctivae and EOM are normal. Pupils are equal, round, and reactive to light.  Neck: Normal range of  motion.  Cardiovascular: Normal rate, regular rhythm and normal heart sounds.   Pulmonary/Chest: Effort normal and breath sounds normal.  Abdominal: Soft. Bowel sounds are normal.  Musculoskeletal: Normal range of motion.  Tenderness of the lateral hip without gross deformity or swelling; no overlying erythema or warmth to touch; leg held into flexion due to pain, unwilling to extend; DP pulse intact; normal sensation throughout right leg  Neurological: He is alert and oriented to person, place, and time.  Skin: Skin is warm and dry.  Psychiatric: He has a normal mood and affect.  Nursing note and vitals reviewed.    ED Treatments / Results  Labs (all labs ordered are listed, but only abnormal results are displayed) Labs Reviewed - No data to display  EKG  EKG Interpretation None       Radiology No results found.  Procedures Procedures (including critical care time)  Medications Ordered in ED Medications - No data to display   Initial Impression / Assessment and Plan / ED Course  I have reviewed the triage vital signs and the nursing notes.  Pertinent labs & imaging results that were available during my care of the patient were reviewed by me and considered in my medical decision making (see chart for details).  Clinical Course    60 year old male here with continued right hip pain. He sustained a fall 2 weeks ago at home onto his right side. He was seen in the ED 3 days ago for right hip pain. He had x-rays and CT scan of the hip which were all normal aside from arthritic changes. His pain improved with Percocet he was discharged home with prescription for same, however never filled this. No new injuries or falls since this time.  On exam, patient has tenderness of the right lateral hip without gross deformity, swelling, overlying erythema, or warmth to touch. He is holding his hip in a flexed position for comfort, refusing to extend it.  His leg is neurovascularly intact.   Do not clinically suspect septic joint.  Patient's pain was treated here with improvement.  He is ambulatory with cane which is baseline for him.  I have spoken with case management, Percocet should be covered under his Medicare plan for $4.  Without new injury or trauma, do not feel further workup indicated at this time. Patient rather just needs pain control. I've encouraged him to fill his Percocet prescription from a few days ago which he has with him. I have also given him a referral to orthopedics as he may benefit from some physical therapy in the future.  Discussed plan with patient, he acknowledged understanding and  agreed with plan of care.  Return precautions given for new or worsening symptoms.  Final Clinical Impressions(s) / ED Diagnoses   Final diagnoses:  Hip pain, right    New Prescriptions New Prescriptions   No medications on file     Garlon HatchetLisa M Wynette Jersey, PA-C 06/26/16 1540    Shaune Pollackameron Isaacs, MD 06/27/16 [phone]

## 2016-07-03 ENCOUNTER — Encounter (HOSPITAL_COMMUNITY): Payer: Self-pay | Admitting: Emergency Medicine

## 2016-07-03 ENCOUNTER — Emergency Department (HOSPITAL_COMMUNITY): Payer: Medicare Other

## 2016-07-03 ENCOUNTER — Emergency Department (HOSPITAL_COMMUNITY)
Admission: EM | Admit: 2016-07-03 | Discharge: 2016-07-04 | Disposition: A | Discharge status: Home / Self Care | Payer: Medicare Other | Attending: Internal Medicine | Admitting: Internal Medicine

## 2016-07-03 DIAGNOSIS — F17210 Nicotine dependence, cigarettes, uncomplicated: Secondary | ICD-10-CM | POA: Diagnosis not present

## 2016-07-03 DIAGNOSIS — M48061 Spinal stenosis, lumbar region without neurogenic claudication: Secondary | ICD-10-CM | POA: Insufficient documentation

## 2016-07-03 DIAGNOSIS — Y939 Activity, unspecified: Secondary | ICD-10-CM | POA: Insufficient documentation

## 2016-07-03 DIAGNOSIS — M4800 Spinal stenosis, site unspecified: Secondary | ICD-10-CM

## 2016-07-03 DIAGNOSIS — M79604 Pain in right leg: Secondary | ICD-10-CM

## 2016-07-03 DIAGNOSIS — Z79899 Other long term (current) drug therapy: Secondary | ICD-10-CM | POA: Diagnosis not present

## 2016-07-03 DIAGNOSIS — Y929 Unspecified place or not applicable: Secondary | ICD-10-CM | POA: Insufficient documentation

## 2016-07-03 DIAGNOSIS — Y999 Unspecified external cause status: Secondary | ICD-10-CM | POA: Insufficient documentation

## 2016-07-03 DIAGNOSIS — W010XXA Fall on same level from slipping, tripping and stumbling without subsequent striking against object, initial encounter: Secondary | ICD-10-CM | POA: Diagnosis not present

## 2016-07-03 DIAGNOSIS — M25552 Pain in left hip: Secondary | ICD-10-CM | POA: Diagnosis present

## 2016-07-03 DIAGNOSIS — R52 Pain, unspecified: Secondary | ICD-10-CM

## 2016-07-03 DIAGNOSIS — I10 Essential (primary) hypertension: Secondary | ICD-10-CM | POA: Insufficient documentation

## 2016-07-03 LAB — CBC WITH DIFFERENTIAL/PLATELET
Basophils Absolute: 0.1 10*3/uL (ref 0.0–0.1)
Basophils Relative: 1 %
Eosinophils Absolute: 0.1 10*3/uL (ref 0.0–0.7)
Eosinophils Relative: 1 %
HCT: 26.6 % — ABNORMAL LOW (ref 39.0–52.0)
HEMOGLOBIN: 8.3 g/dL — AB (ref 13.0–17.0)
LYMPHS ABS: 2.5 10*3/uL (ref 0.7–4.0)
Lymphocytes Relative: 41 %
MCH: 25.4 pg — AB (ref 26.0–34.0)
MCHC: 31.2 g/dL (ref 30.0–36.0)
MCV: 81.3 fL (ref 78.0–100.0)
Monocytes Absolute: 0.6 10*3/uL (ref 0.1–1.0)
Monocytes Relative: 9 %
NEUTROS PCT: 48 %
Neutro Abs: 2.9 10*3/uL (ref 1.7–7.7)
Platelets: 250 10*3/uL (ref 150–400)
RBC: 3.27 MIL/uL — AB (ref 4.22–5.81)
RDW: 15.3 % (ref 11.5–15.5)
WBC: 6 10*3/uL (ref 4.0–10.5)

## 2016-07-03 LAB — URINALYSIS, ROUTINE W REFLEX MICROSCOPIC
Bilirubin Urine: NEGATIVE
Glucose, UA: NEGATIVE mg/dL
Hgb urine dipstick: NEGATIVE
Ketones, ur: NEGATIVE mg/dL
LEUKOCYTES UA: NEGATIVE
NITRITE: NEGATIVE
Protein, ur: NEGATIVE mg/dL
SPECIFIC GRAVITY, URINE: 1.021 (ref 1.005–1.030)
pH: 6.5 (ref 5.0–8.0)

## 2016-07-03 LAB — I-STAT CHEM 8, ED
BUN: 14 mg/dL (ref 6–20)
CHLORIDE: 105 mmol/L (ref 101–111)
Calcium, Ion: 1.2 mmol/L (ref 1.15–1.40)
Creatinine, Ser: 1 mg/dL (ref 0.61–1.24)
GLUCOSE: 83 mg/dL (ref 65–99)
HCT: 27 % — ABNORMAL LOW (ref 39.0–52.0)
Hemoglobin: 9.2 g/dL — ABNORMAL LOW (ref 13.0–17.0)
Potassium: 4 mmol/L (ref 3.5–5.1)
SODIUM: 141 mmol/L (ref 135–145)
TCO2: 26 mmol/L (ref 0–100)

## 2016-07-03 MED ORDER — OXYCODONE-ACETAMINOPHEN 5-325 MG PO TABS
1.0000 | ORAL_TABLET | ORAL | Status: AC | PRN
  Administered 2016-07-03 – 2016-07-04 (×2): 1 via ORAL
  Filled 2016-07-03 (×2): qty 1

## 2016-07-03 MED ORDER — CYCLOBENZAPRINE HCL 10 MG PO TABS
5.0000 mg | ORAL_TABLET | Freq: Once | ORAL | Status: AC
  Administered 2016-07-04: 5 mg via ORAL
  Filled 2016-07-03: qty 1

## 2016-07-03 MED ORDER — PREDNISONE 20 MG PO TABS
60.0000 mg | ORAL_TABLET | Freq: Once | ORAL | Status: AC
  Administered 2016-07-03: 60 mg via ORAL
  Filled 2016-07-03: qty 3

## 2016-07-03 NOTE — ED Triage Notes (Signed)
Per EMS, Pt here with chronic leg pain.  Pt fell x 1 year ago and continues transport for leg pain follow up.  Pt referred to orthopedic.  Pt states he cannot wait for them to call him back for appt.  Vitals:  148/86, hr 94, resp 20,

## 2016-07-03 NOTE — ED Provider Notes (Signed)
[facility]-EMERGENCY DEPT Provider Note   CSN: [id number] Arrival date & time: 07/03/16  1442  By signing my name below, I, Johnny [name], attest that this documentation has been prepared under the direction and in the presence of Johnny FavorGail Carlen Fils, NP. Electronically Signed: Angelene GiovanniEmmanuella [name], ED Scribe. 07/03/16. 8:50 PM.   History   Chief Complaint Chief Complaint  Patient presents with  . Leg Pain  . Flank Pain    HPI Comments: Johnny [name] is a 60 y.o. male with a hx of arthritis, hypertension, and GERD brought in by ambulance, who presents to the Emergency Department complaining of gradually worsening moderate left hip pain he describes as shooting onset 3 weeks ago. He notes that the pain began in his right hip but now he has the pain in his left hip after he slipped and fell 3 weeks ago. He reports that he is also here today requesting evaluation of his chronic bilateral leg pain with swelling. He adds that he has not been able to fully extend his right leg due to the pain. He states that he has tried to follow up with Ortho but has not been successful. No alleviating factors noted. He states that he has tried Ibuprofen at home with no relief. Pt was initially evaluated in the ED for his right hip pain on 06/23/16 after sustaining a fall where he was discharged with Percocet with normal imaging. He was then evaluated on 06/26/16 for the same right hip pain stating that he did not fill his Percocet prescription. He was discharged at that time an encouraged to fill his prescription. No new falls, injuries, or trauma. Pt has NKDA. He denies any fever, chills, chest pain, SOB, open wounds, or any other symptoms.   The history is provided by the patient. No language interpreter was used.    Past Medical History:  Diagnosis Date  . Alcoholism (HCC)   . Arthritis   . Broken neck (HCC)   . Drug abuse    crack  . GERD (gastroesophageal reflux disease)   . H/O tinnitus   . Hydrocele   .  Hypertension    no meds  . Loose, teeth     There are no active problems to display for this patient.   Past Surgical History:  Procedure Laterality Date  . HEMORRHOID SURGERY     as a teenager  . HYDROCELE EXCISION Right 04/15/2014   Procedure: RIGHT HYDROCELECTOMY ADULT;  Surgeon: Johnny FatBenjamin W Herrick, MD;  [facility] ;  Service: Urology;  Laterality: Right;  . NECK SURGERY  1996  . TESTICLE SURGERY         Home Medications    Prior to Admission medications   Medication Sig Start Date End Date Taking? Authorizing Provider  docusate sodium (COLACE) 100 MG capsule Take 1 capsule (100 mg total) by mouth 2 (two) times daily as needed (take to keep stool soft.). Patient not taking: Reported on 06/26/2016 04/15/14   Johnny FatBenjamin W Herrick, MD  ibuprofen (ADVIL,MOTRIN) 800 MG tablet Take 400-800 mg by mouth every 8 (eight) hours as needed for headache or moderate pain.    Historical Provider, MD  oxyCODONE (ROXICODONE) 5 MG immediate release tablet Take 1 tablet (5 mg total) by mouth every 4 (four) hours as needed. Patient not taking: Reported on 06/26/2016 04/15/14   Johnny FatBenjamin W Herrick, MD  oxyCODONE-acetaminophen (PERCOCET/ROXICET) 5-325 MG tablet Take 2 tablets by mouth every 4 (four) hours as needed for severe pain. Patient not taking:  Reported on 06/26/2016 06/23/16   Johnny PlanJoshua G Long, MD    Family History Family History  Problem Relation Age of Onset  . Colon cancer Paternal Uncle   . Heart disease Mother     father    Social History Social History  Substance Use Topics  . Smoking status: Current Every Day Smoker    Packs/day: 0.25    Types: Cigarettes  . Smokeless tobacco: Never Used  . Alcohol use 50.4 oz/week    84 Cans of beer per week     Allergies   Patient has no known allergies.   Review of Systems Review of Systems  Constitutional: Negative for chills and fever.  Respiratory: Negative for shortness of breath.   Cardiovascular:  Positive for leg swelling. Negative for chest pain.  Genitourinary: Negative for dysuria.  Musculoskeletal: Positive for arthralgias and back pain. Negative for joint swelling.  Skin: Negative for wound.     Physical Exam Updated Vital Signs BP 172/97   Pulse 93   Temp 97.7 F (36.5 C) (Oral)   Resp 18   SpO2 99%   Physical Exam  Constitutional: He is oriented to person, place, and time. He appears well-developed and well-nourished. He appears distressed.  HENT:  Head: Normocephalic and atraumatic.  Eyes: Pupils are equal, round, and reactive to light.  Neck: Normal range of motion.  Cardiovascular: Normal rate.   Pulmonary/Chest: Effort normal.  Abdominal: Soft. He exhibits no distension. There is no tenderness.  Musculoskeletal: He exhibits edema and tenderness.       Back:       Legs: Neurological: He is alert and oriented to person, place, and time.  Skin: Skin is warm and dry.  Psychiatric: He has a normal mood and affect.  Nursing note and vitals reviewed.    ED Treatments / Results  DIAGNOSTIC STUDIES: Oxygen Saturation is 99% on RA, normal by my interpretation.    COORDINATION OF CARE: 8:46 PM- Pt advised of plan for treatment and pt agrees. Pt will receive CT lumbar spine and UA for further evaluation. He will also receive Percocet here.    Labs (all labs ordered are listed, but only abnormal results are displayed) Labs Reviewed  URINALYSIS, ROUTINE W REFLEX MICROSCOPIC (NOT AT [facility])    EKG  EKG Interpretation None       Radiology No results found.  Procedures Procedures (including critical care time)  Medications Ordered in ED Medications  oxyCODONE-acetaminophen (PERCOCET/ROXICET) 5-325 MG per tablet 1 tablet (1 tablet Oral Given 07/03/16 1905)     Initial Impression / Assessment and Plan / ED Course  Johnny FavorGail [name], NP has reviewed the triage vital signs and the nursing notes.  Pertinent labs & imaging results that were available during  my care of the patient were reviewed by me and considered in my medical decision making (see chart for details).  Clinical Course    CT scan shows patient has spinal stenosis on multiple levels in the LS-spine. We'll start with steroid taper we'll refer patient to orthopedic.   After IV Robaxin patient is asked able to straighten his leg he is able to ambulate in the hall with a walker he will wait for social work consult in the morning to assist with his medications and obtaining a walker and also if they could assist him in obtaining a follow-up visit with orthopedics that would be very helpful.  Final Clinical Impressions(s) / ED Diagnoses   Final diagnoses:  None    New  Prescriptions New Prescriptions   No medications on file   I personally performed the services described in this documentation, which was scribed in my presence. The recorded information has been reviewed and is accurate.   Johnny Favor, NP 07/04/16 1610    Azalia Bilis, MD 07/06/16 [phone]

## 2016-07-03 NOTE — ED Notes (Signed)
Patient also reports lower back pain radiating to bilateral flank x3 weeks. Denies urinary sx.

## 2016-07-03 NOTE — ED Notes (Signed)
Pt medicated in lobby.  Advised that he cannot drive d/t side effects.

## 2016-07-04 ENCOUNTER — Emergency Department (HOSPITAL_COMMUNITY): Payer: Medicare Other

## 2016-07-04 MED ORDER — ONDANSETRON HCL 4 MG/2ML IJ SOLN
4.0000 mg | Freq: Once | INTRAMUSCULAR | Status: AC
  Administered 2016-07-04: 4 mg via INTRAVENOUS
  Filled 2016-07-04: qty 2

## 2016-07-04 MED ORDER — METHOCARBAMOL 1000 MG/10ML IJ SOLN: 500.0000 mg | Freq: Once | INTRAMUSCULAR | Status: DC

## 2016-07-04 MED ORDER — METHOCARBAMOL 1000 MG/10ML IJ SOLN
500.0000 mg | Freq: Once | INTRAVENOUS | Status: AC
  Administered 2016-07-04: 500 mg via INTRAVENOUS
  Filled 2016-07-04: qty 550
  Filled 2016-07-04: qty 5

## 2016-07-04 MED ORDER — HYDROMORPHONE HCL 1 MG/ML IJ SOLN
1.0000 mg | Freq: Once | INTRAMUSCULAR | Status: AC
  Administered 2016-07-04: 1 mg via INTRAVENOUS
  Filled 2016-07-04: qty 1

## 2016-07-04 MED ORDER — SODIUM CHLORIDE 0.9 % IV SOLN
Freq: Once | INTRAVENOUS | Status: AC
  Administered 2016-07-04: 02:00:00 via INTRAVENOUS

## 2016-07-04 MED ORDER — PREDNISONE 20 MG PO TABS: ORAL_TABLET | ORAL | 0 refills | Status: DC

## 2016-07-04 MED ORDER — METHOCARBAMOL 500 MG PO TABS: 500.0000 mg | ORAL_TABLET | Freq: Two times a day (BID) | ORAL | 0 refills | Status: DC

## 2016-07-04 MED ORDER — OXYCODONE-ACETAMINOPHEN 5-325 MG PO TABS
1.0000 | ORAL_TABLET | ORAL | Status: AC
  Administered 2016-07-04: 1 via ORAL
  Filled 2016-07-04: qty 1

## 2016-07-04 NOTE — Progress Notes (Signed)
Johnny [name],Johnny D, MD  Orthopedic Surgery [phone] [address] [place] [address]    Next Steps: Go on 07/08/2016    Instructions: You have been scheduled an appointment for Monday 07/08/16 at 0900     [facility] - [facility]   [phone] [address] [place] [address]    Next Steps: Follow up    Instructions: [facility] has been called to provide you with a walker Please call them if other concerns    Johnny KussmaulEvans [name] [facility]  Family Medicine [phone] [address] [place] [address] [place] [address]    Next Steps: Go on 07/10/2016    Instructions: YOu have been scheduled an appointment to see Isabelle CourseBrian Belfi at 1030 Please bring with you your insurance card, a photo ID and medications     [facility]   [phone] [address] [place] [address]    Next Steps: Call    Instructions: call as needed but this is the agency the will provide you with home health nurse, phaysical therapy, nurse aide and social worker You will be called to scheduled the first visit     CM spoke with pt in detail he confirms he has fallen previously before fall on 06/13/16 After 06/13/16 fall he [facility] 06[facility]26/[facility]  Pt has a cane at home and preference for DME is walker vs wheelchair.   CM made pt appointments with pt to Johnny [name] ([facility] [phone]) and Johnny KussmaulEvans [name] [facility]([name] [phone]) appt choice He had called [name] on 07/03/16 but had not received the call back that he had an appt (see above)  Discussed how important it is to be compliant to pcp and orthopedic appts related to his medical condition especially if it is chronic/ongoing- pt agreed and confirmed will be compliant Spoke with [name] of advanced about upcoming appt and [facility] Reviewed with pt HHRN, PT aide and SW Voiced understand   At 1020 Cm has spoken with [facility] PT Clydie BraunKaren who had asked CM to check with pt to see if he needed walker or wheelchair Pt prefers walker because he stated walking with PT with walker today  ED CM in with pt when ED PA Joselyn Glassmanyler visited to discuss dx, [name]/c care Reviewed pt appts/DME  1129 [name] brought pt walker in   Ht 6'1" wt 189 lbs

## 2016-07-04 NOTE — ED Notes (Signed)
2L O2 via [place] applied to ensure pt dose not desat after receiving dilaudid.

## 2016-07-04 NOTE — ED Notes (Signed)
PT at bedside.

## 2016-07-04 NOTE — ED Notes (Signed)
Attempted to ambulate pt. In room, pt. Unable to stand on right leg, pt. Redirected back to bed with 2 staff. NP, Dondra SpryGail made aware.

## 2016-07-04 NOTE — Progress Notes (Signed)
Return call back from Poy SippiShannon who is processing pt walker

## 2016-07-04 NOTE — ED Notes (Signed)
Case manager at bedside 

## 2016-07-04 NOTE — Evaluation (Signed)
Physical Therapy Evaluation Patient Details Name: Johnny [name] MRN: [id number] DOB: [birth year] Today's Date: 07/04/2016   History of Present Illness  60 year old male with history of alcoholism, arthritis, GERD, hypertension, presenting to the ED 07/03/16 for right hip pain.  Patient was seen here 3 days ago after a fall in his home. Staes that he got out of bed quickly and slipped and fell. x-rays of his knees and hips as well as a CT of his right hip without any evidence of fracture. He returns with persistent pain in the right hip. States pain is debilitating, making it very hard for him to walk around, even with the assistance of his cane.  States his brother has been helping him get around the house and care for himself.  He denies any new injuries, trauma, or falls..  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  Clinical Impression  The patient presents with significant  Impaired motor control of all extremities with right appearing more involved from previous neck injury in the past. He also has significant decline in functional mobility and is requiring 2 persons to safely ambulate  A short distance. He has high risk for recurrent falls. Noted right foot drop and drags the  Leg. Gait presents with spastic  Movements. The patient could benefit from PT after DC. He currenetly will need assistance to ambulate. He will benefit from A WC until gait , balance and  safety improve.Pt admitted with above diagnosis. Pt currently with functional limitations due to the deficits listed below (see PT Problem List).  Pt will benefit from skilled PT to increase their independence and safety with mobility to allow discharge to the venue listed below.       Follow Up Recommendations SNF;Supervision/Assistance - 24 hour;Home health PT (not eligible for SNF most likely.)    Equipment Recommendations  Wheelchair (measurements PT);Wheelchair cushion (measurements PT);Rolling walker with 5"  wheels    Recommendations for Other Services       Precautions / Restrictions Precautions Precautions: Fall      Mobility  Bed Mobility Overal bed mobility: Needs Assistance Bed Mobility: Supine to Sit;Sit to Supine     Supine to sit: Mod assist Sit to supine: Mod assist   General bed mobility comments: extra time , assist with trunk to sitting. Assist with legs to supine.  Transfers Overall transfer level: Needs assistance Equipment used: Rolling walker (2 wheeled) Transfers: Sit to/from Stand Sit to Stand: Max assist;+2 physical assistance;+2 safety/equipment;From elevated surface         General transfer comment: Lifting and steady assist to stand from gurney. Unbalanced upon standing./ Decreased left knee flexion during sit to stand  Ambulation/Gait Ambulation/Gait assistance: Mod assist;Max assist;+2 physical assistance;+2 safety/equipment Ambulation Distance (Feet): 40 Feet Assistive device: Rolling walker (2 wheeled) Gait Pattern/deviations: Step-to pattern;Decreased dorsiflexion - right;Decreased weight shift to right;Decreased step length - left;Decreased step length - right;Ataxic Gait velocity: very slow Gait velocity interpretation: <1.8 ft/sec, indicative of risk for recurrent falls General Gait Details: patient requires steady assist for balance. Keeps RW ahead, ataxic and decreased control of  right leg, unable to advance right and tends to drag the right  leg. Noted loss of balance with collapse of the right leg when turning to back up to the gurney and patient was assisted with 2 persons to sit  at the edge of the gurney.  Stairs            Psychologist, prison and probation services  Modified Rankin (Stroke Patients Only)       Balance Overall balance assessment: Needs assistance;History of Falls Sitting-balance support: Feet supported;Bilateral upper extremity supported Sitting balance-Leahy Scale: Fair     Standing balance support: During functional  activity;Bilateral upper extremity supported Standing balance-Leahy Scale: Poor Standing balance comment: noted to lean more towards the right.                             Pertinent Vitals/Pain Pain Assessment: Faces Faces Pain Scale: Hurts whole lot Pain Location: inside both legs, Left side. Pain Descriptors / Indicators: Aching Pain Intervention(s): Limited activity within patient's tolerance;Monitored during session;Repositioned    Home Living Family/patient expects to be discharged to:: Private residence Living Arrangements: Parent;Other relatives Available Help at Discharge: Family Type of Home: House Home Access: Level entry     Home Layout: One level Home Equipment: Cane - single point      Prior Function Level of Independence: Independent with assistive device(s)         Comments: patiwent was ambulatory with a SPC prior to fall     Hand Dominance        Extremity/Trunk Assessment   Upper Extremity Assessment: RUE deficits/detail;LUE deficits/detail RUE Deficits / Details: noted intrinsic wasting,      LUE Deficits / Details: same as right but has more coordination.   Lower Extremity Assessment: LLE deficits/detail;RLE deficits/detail RLE Deficits / Details: noted edema. foot drop noted. Hyper esthesia to touch.. Grossly 3+/5 hip LLE Deficits / Details: same as right.  Cervical / Trunk Assessment: Other exceptions  Communication   Communication: No difficulties  Cognition Arousal/Alertness: Awake/alert Behavior During Therapy: WFL for tasks assessed/performed Overall Cognitive Status: Within Functional Limits for tasks assessed                      General Comments      Exercises     Assessment/Plan    PT Assessment Patient needs continued PT services  PT Problem List Decreased strength;Decreased range of motion;Decreased activity tolerance;Decreased balance;Decreased mobility;Decreased knowledge of precautions;Decreased  safety awareness;Decreased knowledge of use of DME;Impaired tone;Pain;Impaired sensation          PT Treatment Interventions DME instruction;Gait training;Functional mobility training;Therapeutic activities;Therapeutic exercise;Patient/family education;Wheelchair mobility training;Balance training    PT Goals (Current goals can be found in the Care Plan section)  Acute Rehab PT Goals Patient Stated Goal: to go home, get something to eat. PT Goal Formulation: With patient Time For Goal Achievement: 07/11/16 Potential to Achieve Goals: Fair    Frequency Min 3X/week   Barriers to discharge Decreased caregiver support      Co-evaluation               End of Session   Activity Tolerance: Patient limited by fatigue;Patient limited by pain Patient left: in bed;with call bell/phone within reach Nurse Communication: Mobility status    Functional Assessment Tool Used: clinical judgement Functional Limitation: Mobility: Walking and moving around Mobility: Walking and Moving Around Current Status ([id number](G8978): At least 80 percent but less than 100 percent impaired, limited or restricted Mobility: Walking and Moving Around Goal Status [phone](G8979): At least 1 percent but less than 20 percent impaired, limited or restricted    Time: 0932-0958 PT Time Calculation (min) (ACUTE ONLY): 26 min   Charges:   PT Evaluation $PT Eval Low Complexity: 1 Procedure PT Treatments $Gait Training: 8-22 mins   PT G Codes:  PT G-Codes **NOT FOR INPATIENT CLASS** Functional Assessment Tool Used: clinical judgement Functional Limitation: Mobility: Walking and moving around Mobility: Walking and Moving Around Current Status ([id number](G8978): At least 80 percent but less than 100 percent impaired, limited or restricted Mobility: Walking and Moving Around Goal Status [phone]([id number]): At least 1 percent but less than 20 percent impaired, limited or restricted    Rada HayHill, Marleigh Kaylor Elizabeth 07/04/2016, 10:58 AM Blanchard KelchKaren Karee Christopherson  PT [phone]

## 2016-07-04 NOTE — Discharge Instructions (Signed)
Continue efforts to contact Dr. Charlann Boxerlin for orthopedic evaluation. Use a walker for ambulation Take the medication as prescribed until all tablets have been completed. Return at any time here pain is out of control he develop new or worsening symptoms such as incontinence.

## 2016-07-04 NOTE — Progress Notes (Signed)
Contact made with [facility] RN, PT, SW ans aide at [phone]

## 2016-07-04 NOTE — ED Notes (Signed)
Pt ambulated 3215ft prior to needing to rest. Pt has unsteady gait and continue to has pain with ambulation, and is unable to put his  heel flat on the ground.

## 2016-07-04 NOTE — Progress Notes (Signed)
Message from Marlene LardNyra Thompson at [facility] to confirm pt is not eligible if he does not have medicaid or the orange card

## 2016-07-04 NOTE — Progress Notes (Signed)
Sent Email to Elwyn LadeB Thompson at DSS/Medicaid transportation to see if pt can be assisted with transportation to appointment on Monday 07/10/16 to Dr Charlann Boxerlin office Left Pt and CM contact information

## 2016-07-04 NOTE — Progress Notes (Signed)
ED CM [facility] a [facility] message [facility] [place] transportation coordinator [phone] Request a call to Pt listed home number for transportation referral and assist with transportation for upcoming appt or to Cm mobile # for questions

## 2016-07-04 NOTE — Progress Notes (Signed)
Consulted by ED SW after confirming not a ED SW consult. ED CM spoeke with Pricilla Holmucker ED PA who states pt need  To be seen by an orthopedic provider he was referred to previously from ED CM notes pt without pcp listed in EPIC at this time ED PA states pt needs assist with a walker and medications CM can inquire of [facility]ME provider [facility] assist but confirms with ED PA [facility] does not have a program to assist a medicare pt with medications  Pt [facility] x 3 and no admission in last 6 months No ED CP nor [facility] availability

## 2016-07-04 NOTE — Progress Notes (Addendum)
Called and waited 10 minutes for [facility] [phone] but no answer to attempt to get pt transportation for both or either appts on next week  Pt given copy of 4 page list for [place] transportation for [place] for non medical and medical transport and informed Cm left message at senior resources services for them to call his number

## 2016-07-04 NOTE — Progress Notes (Signed)
   07/03/16 0000  CM Assessment  Expected Discharge Plan Home w Home Health Services  In-house Referral Clinical Social Work  Discharge Planning Services CM Consult  [facility];Durable Medical Equipment  Choice offered to / list presented to  Patient  DME Arranged Dan HumphreysWalker  DME Agency [facility]  [facility] RN;PT;Nurse's Aide;Social Work  [facility]  Status of Service Completed, signed off  Discharge Disposition Home w Home Health Services  Cm spoke again with ED PA to get clarified DME order  ED PA states pt seen by [facility] PT Cm discussed ordering pt HHRN, PT, aide and SW Dicussed with ED PA if pt not meeting inpatient admission he will pay out of pocket for facility placement Discussed medicare guidelines for admission and facility placement and 3 day qualifying stay

## 2016-07-04 NOTE — Progress Notes (Signed)
Pt states he has support of his mother , brother, nephew and niece Lives with mother and brother - Brother works Niece gets his medications- Nephew or niece help take him to dr appts

## 2016-07-04 NOTE — Progress Notes (Signed)
CM left voice message for Carollee HerterShannon of [facility] about need for a walker for pt  CM reminded ED PA Pricilla Holmucker to enter pt walker DME order in [facility]

## 2016-07-04 NOTE — ED Provider Notes (Signed)
  Physical Exam  BP 151/81 (BP Location: Right Arm)   Pulse 93   Temp 97.7 F (36.5 C) (Oral)   Resp 18   SpO2 96%   Physical Exam  ED Course  Procedures  MDM Sign out from Earley FavorGail Schulz, NP  Per previous provider MDM  CT scan shows patient has spinal stenosis on multiple levels in the LS-spine. We'll start with steroid taper we'll refer patient to orthopedic.   After IV Robaxin patient is asked able to straighten his leg he is able to ambulate in the hall with a walker he will wait for social work consult in the morning to assist with his medications and obtaining a walker and also if they could assist him in obtaining a follow-up visit with orthopedics that would be very helpful.  Pending Social Work Consult to aid with medications, walker, and follow up with Dr. Charlann Boxerlin (Orthopedics) Pt able to ambulate in hall DC home with steroids and robaxin  CT Impression IMPRESSION: 1. Extensive anterior and lateral thoracolumbar osteophytosis, consistent with diffuse idiopathic skeletal hyperostosis (dish). 2. Moderate to severe neural foraminal narrowing at multiple levels, worst at L3-L4, L4-L5 and L5-S1. 3. Moderate to severe L3-L4 spinal canal stenosis. 4. No acute compression fracture or evidence of discitis osteomyelitis.  10:17 AM- PT Seen patient. Unsteady gait requiring assist.  10:17 AM- Consult Case Management. Will provide Home Health (PT, Nurse Aid, Nurse). Walker for ambulation. Given resources for PCP to arrange follow up with Orthopedics.  Has appointment with Orthopedics scheduled on Monday  Given Rx Prednisone and Robaxin  At time of discharge, Patient is in no acute distress. Vital Signs are stable. Patient is able to ambulate. Patient able to tolerate PO.       Audry Piliyler Briellah Baik, PA-C 07/04/16 1151    Geoffery Lyonsouglas Delo, MD 07/04/16 [phone]

## 2016-08-02 ENCOUNTER — Other Ambulatory Visit (HOSPITAL_COMMUNITY): Payer: Self-pay | Admitting: Orthopedic Surgery

## 2016-08-02 DIAGNOSIS — M25561 Pain in right knee: Secondary | ICD-10-CM

## 2016-08-02 DIAGNOSIS — M25562 Pain in left knee: Secondary | ICD-10-CM

## 2016-08-09 ENCOUNTER — Ambulatory Visit (HOSPITAL_COMMUNITY): Admission: RE | Admit: 2016-08-09 | Payer: Medicare Other | Source: Ambulatory Visit

## 2016-08-10 ENCOUNTER — Emergency Department (HOSPITAL_COMMUNITY): Payer: Medicare Other

## 2016-08-10 ENCOUNTER — Inpatient Hospital Stay (HOSPITAL_COMMUNITY)
Admission: EM | Admit: 2016-08-10 | Discharge: 2016-08-21 | DRG: 377 | Disposition: A | Discharge status: Skilled Nursing Facility | Payer: Medicare Other | Attending: Internal Medicine | Admitting: Internal Medicine

## 2016-08-10 ENCOUNTER — Inpatient Hospital Stay (HOSPITAL_COMMUNITY): Payer: Medicare Other

## 2016-08-10 ENCOUNTER — Encounter (HOSPITAL_COMMUNITY): Payer: Self-pay | Admitting: Radiology

## 2016-08-10 DIAGNOSIS — J690 Pneumonitis due to inhalation of food and vomit: Secondary | ICD-10-CM | POA: Diagnosis present

## 2016-08-10 DIAGNOSIS — E162 Hypoglycemia, unspecified: Secondary | ICD-10-CM | POA: Diagnosis present

## 2016-08-10 DIAGNOSIS — R680 Hypothermia, not associated with low environmental temperature: Secondary | ICD-10-CM | POA: Diagnosis not present

## 2016-08-10 DIAGNOSIS — I10 Essential (primary) hypertension: Secondary | ICD-10-CM | POA: Diagnosis present

## 2016-08-10 DIAGNOSIS — N138 Other obstructive and reflux uropathy: Secondary | ICD-10-CM | POA: Diagnosis not present

## 2016-08-10 DIAGNOSIS — Z888 Allergy status to other drugs, medicaments and biological substances status: Secondary | ICD-10-CM

## 2016-08-10 DIAGNOSIS — N171 Acute kidney failure with acute cortical necrosis: Secondary | ICD-10-CM | POA: Diagnosis not present

## 2016-08-10 DIAGNOSIS — N179 Acute kidney failure, unspecified: Secondary | ICD-10-CM

## 2016-08-10 DIAGNOSIS — Z800 Family history of malignant neoplasm of digestive organs: Secondary | ICD-10-CM

## 2016-08-10 DIAGNOSIS — K228 Other specified diseases of esophagus: Secondary | ICD-10-CM | POA: Diagnosis present

## 2016-08-10 DIAGNOSIS — K922 Gastrointestinal hemorrhage, unspecified: Secondary | ICD-10-CM

## 2016-08-10 DIAGNOSIS — G9341 Metabolic encephalopathy: Secondary | ICD-10-CM | POA: Diagnosis present

## 2016-08-10 DIAGNOSIS — Z978 Presence of other specified devices: Secondary | ICD-10-CM

## 2016-08-10 DIAGNOSIS — K260 Acute duodenal ulcer with hemorrhage: Principal | ICD-10-CM

## 2016-08-10 DIAGNOSIS — Z8249 Family history of ischemic heart disease and other diseases of the circulatory system: Secondary | ICD-10-CM

## 2016-08-10 DIAGNOSIS — F1020 Alcohol dependence, uncomplicated: Secondary | ICD-10-CM | POA: Diagnosis present

## 2016-08-10 DIAGNOSIS — J9601 Acute respiratory failure with hypoxia: Secondary | ICD-10-CM

## 2016-08-10 DIAGNOSIS — Y846 Urinary catheterization as the cause of abnormal reaction of the patient, or of later complication, without mention of misadventure at the time of the procedure: Secondary | ICD-10-CM | POA: Diagnosis not present

## 2016-08-10 DIAGNOSIS — D62 Acute posthemorrhagic anemia: Secondary | ICD-10-CM | POA: Diagnosis present

## 2016-08-10 DIAGNOSIS — K219 Gastro-esophageal reflux disease without esophagitis: Secondary | ICD-10-CM | POA: Diagnosis present

## 2016-08-10 DIAGNOSIS — N170 Acute kidney failure with tubular necrosis: Secondary | ICD-10-CM | POA: Diagnosis present

## 2016-08-10 DIAGNOSIS — R739 Hyperglycemia, unspecified: Secondary | ICD-10-CM | POA: Diagnosis not present

## 2016-08-10 DIAGNOSIS — J9600 Acute respiratory failure, unspecified whether with hypoxia or hypercapnia: Secondary | ICD-10-CM | POA: Diagnosis present

## 2016-08-10 DIAGNOSIS — Z6823 Body mass index (BMI) 23.0-23.9, adult: Secondary | ICD-10-CM | POA: Diagnosis not present

## 2016-08-10 DIAGNOSIS — F17210 Nicotine dependence, cigarettes, uncomplicated: Secondary | ICD-10-CM | POA: Diagnosis present

## 2016-08-10 DIAGNOSIS — K2210 Ulcer of esophagus without bleeding: Secondary | ICD-10-CM | POA: Diagnosis present

## 2016-08-10 DIAGNOSIS — Z23 Encounter for immunization: Secondary | ICD-10-CM | POA: Diagnosis present

## 2016-08-10 DIAGNOSIS — R571 Hypovolemic shock: Secondary | ICD-10-CM | POA: Diagnosis present

## 2016-08-10 DIAGNOSIS — R262 Difficulty in walking, not elsewhere classified: Secondary | ICD-10-CM

## 2016-08-10 DIAGNOSIS — M1990 Unspecified osteoarthritis, unspecified site: Secondary | ICD-10-CM | POA: Diagnosis present

## 2016-08-10 DIAGNOSIS — E875 Hyperkalemia: Secondary | ICD-10-CM | POA: Diagnosis present

## 2016-08-10 DIAGNOSIS — G9340 Encephalopathy, unspecified: Secondary | ICD-10-CM

## 2016-08-10 DIAGNOSIS — I161 Hypertensive emergency: Secondary | ICD-10-CM | POA: Diagnosis present

## 2016-08-10 DIAGNOSIS — K920 Hematemesis: Secondary | ICD-10-CM | POA: Diagnosis present

## 2016-08-10 DIAGNOSIS — S3730XA Unspecified injury of urethra, initial encounter: Secondary | ICD-10-CM | POA: Diagnosis not present

## 2016-08-10 DIAGNOSIS — F1410 Cocaine abuse, uncomplicated: Secondary | ICD-10-CM | POA: Diagnosis present

## 2016-08-10 DIAGNOSIS — K254 Chronic or unspecified gastric ulcer with hemorrhage: Secondary | ICD-10-CM

## 2016-08-10 DIAGNOSIS — D500 Iron deficiency anemia secondary to blood loss (chronic): Secondary | ICD-10-CM

## 2016-08-10 LAB — URINALYSIS, ROUTINE W REFLEX MICROSCOPIC
BILIRUBIN URINE: NEGATIVE
Glucose, UA: NEGATIVE mg/dL
HGB URINE DIPSTICK: NEGATIVE
Ketones, ur: NEGATIVE mg/dL
Leukocytes, UA: NEGATIVE
Nitrite: NEGATIVE
PH: 6 (ref 5.0–8.0)
Protein, ur: NEGATIVE mg/dL
SPECIFIC GRAVITY, URINE: 1.009 (ref 1.005–1.030)

## 2016-08-10 LAB — COMPREHENSIVE METABOLIC PANEL
ALK PHOS: 53 U/L (ref 38–126)
ALK PHOS: 57 U/L (ref 38–126)
ALT: 18 U/L (ref 17–63)
ALT: 18 U/L (ref 17–63)
ANION GAP: 10 (ref 5–15)
ANION GAP: 9 (ref 5–15)
AST: 28 U/L (ref 15–41)
AST: 24 U/L (ref 15–41)
Albumin: 2.7 g/dL — ABNORMAL LOW (ref 3.5–5.0)
Albumin: 2.6 g/dL — ABNORMAL LOW (ref 3.5–5.0)
BILIRUBIN TOTAL: 0.2 mg/dL — AB (ref 0.3–1.2)
BUN: 40 mg/dL — ABNORMAL HIGH (ref 6–20)
BUN: 43 mg/dL — ABNORMAL HIGH (ref 6–20)
CALCIUM: 7.9 mg/dL — AB (ref 8.9–10.3)
CALCIUM: 7.2 mg/dL — AB (ref 8.9–10.3)
CO2: 21 mmol/L — ABNORMAL LOW (ref 22–32)
CO2: 23 mmol/L (ref 22–32)
Chloride: 102 mmol/L (ref 101–111)
Chloride: 104 mmol/L (ref 101–111)
Creatinine, Ser: 1.5 mg/dL — ABNORMAL HIGH (ref 0.61–1.24)
Creatinine, Ser: 1.34 mg/dL — ABNORMAL HIGH (ref 0.61–1.24)
GFR calc non Af Amer: 48 mL/min — ABNORMAL LOW (ref >60)
GFR calc non Af Amer: 55 mL/min — ABNORMAL LOW (ref >60)
GFR, EST AFRICAN AMERICAN: 56 mL/min — AB (ref >60)
GLUCOSE: 241 mg/dL — AB (ref 65–99)
GLUCOSE: 130 mg/dL — AB (ref 65–99)
Potassium: 5.5 mmol/L — ABNORMAL HIGH (ref 3.5–5.1)
Potassium: 5.1 mmol/L (ref 3.5–5.1)
SODIUM: 133 mmol/L — AB (ref 135–145)
Sodium: 136 mmol/L (ref 135–145)
TOTAL PROTEIN: 5.4 g/dL — AB (ref 6.5–8.1)
Total Bilirubin: <0.1 mg/dL — ABNORMAL LOW (ref 0.3–1.2)
Total Protein: 5.4 g/dL — ABNORMAL LOW (ref 6.5–8.1)

## 2016-08-10 LAB — CBC WITH DIFFERENTIAL/PLATELET
BASOS PCT: 0 %
Basophils Absolute: 0 10*3/uL (ref 0.0–0.1)
Eosinophils Absolute: 0.1 10*3/uL (ref 0.0–0.7)
Eosinophils Relative: 1 %
HEMATOCRIT: 20 % — AB (ref 39.0–52.0)
HEMOGLOBIN: 6.3 g/dL — AB (ref 13.0–17.0)
LYMPHS PCT: 20 %
Lymphs Abs: 2.4 10*3/uL (ref 0.7–4.0)
MCH: 25.6 pg — AB (ref 26.0–34.0)
MCHC: 31.5 g/dL (ref 30.0–36.0)
MCV: 81.3 fL (ref 78.0–100.0)
MONO ABS: 0.5 10*3/uL (ref 0.1–1.0)
MONOS PCT: 4 %
NEUTROS ABS: 8.6 10*3/uL — AB (ref 1.7–7.7)
NEUTROS PCT: 74 %
Platelets: 295 10*3/uL (ref 150–400)
RBC: 2.46 MIL/uL — ABNORMAL LOW (ref 4.22–5.81)
RDW: 18 % — AB (ref 11.5–15.5)
WBC: 11.6 10*3/uL — ABNORMAL HIGH (ref 4.0–10.5)

## 2016-08-10 LAB — AMYLASE: AMYLASE: 56 U/L (ref 28–100)

## 2016-08-10 LAB — CBC
HCT: 23.8 % — ABNORMAL LOW (ref 39.0–52.0)
Hemoglobin: 7.6 g/dL — ABNORMAL LOW (ref 13.0–17.0)
MCH: 26.6 pg (ref 26.0–34.0)
MCHC: 31.9 g/dL (ref 30.0–36.0)
MCV: 83.2 fL (ref 78.0–100.0)
PLATELETS: 269 10*3/uL (ref 150–400)
RBC: 2.86 MIL/uL — AB (ref 4.22–5.81)
RDW: 16.7 % — ABNORMAL HIGH (ref 11.5–15.5)
WBC: 11.6 10*3/uL — ABNORMAL HIGH (ref 4.0–10.5)

## 2016-08-10 LAB — CORTISOL: Cortisol, Plasma: 13 ug/dL

## 2016-08-10 LAB — LACTIC ACID, PLASMA: LACTIC ACID, VENOUS: 1.2 mmol/L (ref 0.5–1.9)

## 2016-08-10 LAB — PROTIME-INR
INR: 1.1
INR: 1.16
PROTHROMBIN TIME: 14.2 s (ref 11.4–15.2)
PROTHROMBIN TIME: 14.9 s (ref 11.4–15.2)

## 2016-08-10 LAB — I-STAT CHEM 8, ED
BUN: 40 mg/dL — ABNORMAL HIGH (ref 6–20)
CALCIUM ION: 1.08 mmol/L — AB (ref 1.15–1.40)
CHLORIDE: 101 mmol/L (ref 101–111)
Creatinine, Ser: 1.7 mg/dL — ABNORMAL HIGH (ref 0.61–1.24)
GLUCOSE: 239 mg/dL — AB (ref 65–99)
HEMATOCRIT: 18 % — AB (ref 39.0–52.0)
HEMOGLOBIN: 6.1 g/dL — AB (ref 13.0–17.0)
POTASSIUM: 5.4 mmol/L — AB (ref 3.5–5.1)
SODIUM: 134 mmol/L — AB (ref 135–145)
TCO2: 26 mmol/L (ref 0–100)

## 2016-08-10 LAB — APTT
aPTT: 25 seconds (ref 24–36)
aPTT: 26 seconds (ref 24–36)

## 2016-08-10 LAB — PREPARE RBC (CROSSMATCH)

## 2016-08-10 LAB — I-STAT ARTERIAL BLOOD GAS, ED
Acid-Base Excess: 2 mmol/L (ref 0.0–2.0)
BICARBONATE: 28.5 mmol/L — AB (ref 20.0–28.0)
O2 Saturation: 100 %
PCO2 ART: 55.2 mmHg — AB (ref 32.0–48.0)
PH ART: 7.321 — AB (ref 7.350–7.450)
TCO2: 30 mmol/L (ref 0–100)
pO2, Arterial: 535 mmHg — ABNORMAL HIGH (ref 83.0–108.0)

## 2016-08-10 LAB — GLUCOSE, CAPILLARY: Glucose-Capillary: 149 mg/dL — ABNORMAL HIGH (ref 65–99)

## 2016-08-10 LAB — MRSA PCR SCREENING: MRSA by PCR: NEGATIVE

## 2016-08-10 LAB — TRIGLYCERIDES: Triglycerides: 103 mg/dL (ref <150)

## 2016-08-10 LAB — AMMONIA: AMMONIA: 37 umol/L — AB (ref 9–35)

## 2016-08-10 LAB — LIPASE, BLOOD
LIPASE: 18 U/L (ref 11–51)
Lipase: 19 U/L (ref 11–51)

## 2016-08-10 LAB — I-STAT CG4 LACTIC ACID, ED: Lactic Acid, Venous: 3.46 mmol/L (ref 0.5–1.9)

## 2016-08-10 LAB — LACTATE DEHYDROGENASE: LDH: 184 U/L (ref 98–192)

## 2016-08-10 LAB — CBG MONITORING, ED: Glucose-Capillary: 140 mg/dL — ABNORMAL HIGH (ref 65–99)

## 2016-08-10 MED ORDER — SODIUM CHLORIDE 0.9 % IV SOLN
250.0000 mL | INTRAVENOUS | Status: DC | PRN
  Administered 2016-08-13: 250 mL via INTRAVENOUS

## 2016-08-10 MED ORDER — PROPOFOL 1000 MG/100ML IV EMUL
5.0000 ug/kg/min | Freq: Once | INTRAVENOUS | Status: AC
  Administered 2016-08-10: 20 ug/kg/min via INTRAVENOUS

## 2016-08-10 MED ORDER — DEXTROSE 50 % IV SOLN
INTRAVENOUS | Status: AC | PRN
  Administered 2016-08-10: 1 via INTRAVENOUS

## 2016-08-10 MED ORDER — LABETALOL HCL 5 MG/ML IV SOLN
10.0000 mg | Freq: Once | INTRAVENOUS | Status: AC
  Administered 2016-08-10: 10 mg via INTRAVENOUS

## 2016-08-10 MED ORDER — KETAMINE HCL-SODIUM CHLORIDE 100-0.9 MG/10ML-% IV SOSY
2.0000 mg/kg | PREFILLED_SYRINGE | Freq: Once | INTRAVENOUS | Status: DC
  Filled 2016-08-10: qty 20

## 2016-08-10 MED ORDER — FOLIC ACID 5 MG/ML IJ SOLN
1.0000 mg | Freq: Every day | INTRAMUSCULAR | Status: DC
  Administered 2016-08-10 – 2016-08-12 (×3): 1 mg via INTRAVENOUS
  Filled 2016-08-10 (×4): qty 0.2

## 2016-08-10 MED ORDER — SODIUM CHLORIDE 0.9 % IV SOLN
0.0000 ug/h | INTRAVENOUS | Status: DC
  Administered 2016-08-10: 200 ug/h via INTRAVENOUS
  Administered 2016-08-11: 100 ug/h via INTRAVENOUS
  Filled 2016-08-10 (×2): qty 50

## 2016-08-10 MED ORDER — FENTANYL CITRATE (PF) 100 MCG/2ML IJ SOLN: 100.0000 ug | INTRAMUSCULAR | Status: DC | PRN

## 2016-08-10 MED ORDER — SODIUM CHLORIDE 0.9 % IV SOLN: 10.0000 mL/h | Freq: Once | INTRAVENOUS | Status: DC

## 2016-08-10 MED ORDER — THIAMINE HCL 100 MG/ML IJ SOLN
100.0000 mg | Freq: Every day | INTRAMUSCULAR | Status: DC
  Administered 2016-08-11 – 2016-08-12 (×2): 100 mg via INTRAVENOUS
  Filled 2016-08-10 (×3): qty 2

## 2016-08-10 MED ORDER — SODIUM CHLORIDE 0.9 % IV SOLN
80.0000 mg | Freq: Once | INTRAVENOUS | Status: AC
  Administered 2016-08-10: 80 mg via INTRAVENOUS
  Filled 2016-08-10: qty 80

## 2016-08-10 MED ORDER — FENTANYL CITRATE (PF) 100 MCG/2ML IJ SOLN
INTRAMUSCULAR | Status: AC
  Administered 2016-08-10: 100 ug
  Filled 2016-08-10: qty 2

## 2016-08-10 MED ORDER — OCTREOTIDE LOAD VIA INFUSION
50.0000 ug | Freq: Once | INTRAVENOUS | Status: AC
  Administered 2016-08-10: 50 ug via INTRAVENOUS
  Filled 2016-08-10: qty 25

## 2016-08-10 MED ORDER — HYDRALAZINE HCL 20 MG/ML IJ SOLN: 10.0000 mg | INTRAMUSCULAR | Status: DC | PRN

## 2016-08-10 MED ORDER — FENTANYL 2500MCG IN NS 250ML (10MCG/ML) PREMIX INFUSION
0.0000 ug/h | INTRAVENOUS | Status: DC
  Administered 2016-08-10: 50 ug/h via INTRAVENOUS
  Filled 2016-08-10 (×3): qty 250

## 2016-08-10 MED ORDER — DEXTROSE 5 % IV SOLN
1.0000 g | INTRAVENOUS | Status: DC
  Filled 2016-08-10: qty 10

## 2016-08-10 MED ORDER — EPINEPHRINE PF 1 MG/10ML IJ SOSY
PREFILLED_SYRINGE | INTRAMUSCULAR | Status: AC
  Filled 2016-08-10: qty 10

## 2016-08-10 MED ORDER — DEXTROSE-NACL 5-0.45 % IV SOLN
INTRAVENOUS | Status: DC
  Administered 2016-08-10 – 2016-08-12 (×2): via INTRAVENOUS

## 2016-08-10 MED ORDER — ROCURONIUM BROMIDE 50 MG/5ML IV SOLN
INTRAVENOUS | Status: AC | PRN
  Administered 2016-08-10: 100 mg via INTRAVENOUS

## 2016-08-10 MED ORDER — KETAMINE HCL-SODIUM CHLORIDE 100-0.9 MG/10ML-% IV SOSY
PREFILLED_SYRINGE | INTRAVENOUS | Status: AC
  Filled 2016-08-10: qty 10

## 2016-08-10 MED ORDER — DEXTROSE 5 % IV SOLN
1.0000 g | Freq: Once | INTRAVENOUS | Status: AC
  Administered 2016-08-10: 1 g via INTRAVENOUS
  Filled 2016-08-10: qty 10

## 2016-08-10 MED ORDER — NICARDIPINE HCL IN NACL 20-0.86 MG/200ML-% IV SOLN
3.0000 mg/h | Freq: Once | INTRAVENOUS | Status: AC
  Administered 2016-08-10: 3 mg/h via INTRAVENOUS
  Filled 2016-08-10: qty 200

## 2016-08-10 MED ORDER — PANTOPRAZOLE SODIUM 40 MG IV SOLR
80.0000 mg | Freq: Once | INTRAVENOUS | Status: AC
Start: 2016-08-10 — End: 2016-08-10
  Administered 2016-08-10: 80 mg via INTRAVENOUS
  Filled 2016-08-10: qty 80

## 2016-08-10 MED ORDER — ATROPINE SULFATE 1 MG/10ML IJ SOSY
PREFILLED_SYRINGE | INTRAMUSCULAR | Status: AC
  Filled 2016-08-10: qty 10

## 2016-08-10 MED ORDER — SODIUM CHLORIDE 0.9 % IV SOLN
50.0000 ug/h | INTRAVENOUS | Status: DC
  Administered 2016-08-10: 50 ug/h via INTRAVENOUS
  Filled 2016-08-10 (×5): qty 1

## 2016-08-10 MED ORDER — PROPOFOL 1000 MG/100ML IV EMUL
5.0000 ug/kg/min | INTRAVENOUS | Status: DC
  Administered 2016-08-10: 70 ug/kg/min via INTRAVENOUS
  Administered 2016-08-11 – 2016-08-12 (×2): 20 ug/kg/min via INTRAVENOUS
  Filled 2016-08-10 (×2): qty 100
  Filled 2016-08-10: qty 200
  Filled 2016-08-10 (×6): qty 100

## 2016-08-10 MED ORDER — KETAMINE HCL 10 MG/ML IJ SOLN
INTRAMUSCULAR | Status: AC | PRN
  Administered 2016-08-10: 250 mg via INTRAVENOUS

## 2016-08-10 MED ORDER — FENTANYL CITRATE (PF) 100 MCG/2ML IJ SOLN
100.0000 ug | INTRAMUSCULAR | Status: DC | PRN
  Administered 2016-08-10: 100 ug via INTRAVENOUS
  Filled 2016-08-10: qty 2

## 2016-08-10 MED ORDER — SODIUM CHLORIDE 0.9 % IV SOLN
8.0000 mg/h | INTRAVENOUS | Status: DC
  Administered 2016-08-10 – 2016-08-12 (×4): 8 mg/h via INTRAVENOUS
  Filled 2016-08-10 (×8): qty 80

## 2016-08-10 MED ORDER — PANTOPRAZOLE SODIUM 40 MG IV SOLR: 40.0000 mg | Freq: Two times a day (BID) | INTRAVENOUS | Status: DC

## 2016-08-10 MED ORDER — PROPOFOL 1000 MG/100ML IV EMUL
INTRAVENOUS | Status: AC
  Filled 2016-08-10: qty 100

## 2016-08-10 NOTE — Progress Notes (Signed)
Pt arrived on the unit, ETT at 40%, lethargic, with non purposful movements, does not follow commands, dry blood over the mouth, vomiting blood (small amount) from the mouth,  dark blood from gastric tube, dark bloody secretions from the ETT.  Pupils pinpoint. At the arrival pt could open eyes, at this point pt does not have corneal reflex.

## 2016-08-10 NOTE — Progress Notes (Signed)
RT assisted in pt transport on vent from ED to 2M09. Pt's vitals remained stable throughout.

## 2016-08-10 NOTE — Consult Note (Addendum)
Consultation  Referring Provider:     ED and Dr. Tyson [name] Primary Care Physician:  [facility] Primary Gastroenterologist:        ? Christella HartiganJacobs Reason for Consultation:     GI bleed     Impression / Plan:   Life threatening upper GI bleed with hematemesis and reported hematochezia Acute blood loss anemia Hx substance abuse  Needs EGD - waiting on results of CT head - would hold off on EGD if it show intracranial bleed No close family here - may not be able to get consent niece says mother has dementia and they are trying to find his daughter          HPI:   Johnny [name] is a 61 y.o. male found down today and brought to ED - has had hematemesis and hematochezia. Is hypertensive. Was intubated to protect airway. Has gotten 2 U RBC Hgb still 6 after first unit. Going off to head CT now then to ICU Niece/nephew unaware of any NSAID's etc - recent leg pain and maybe hydrocodone. Hx really unknown No known hx GI bleeding Hgb 8-8 in Nov 2017 Got prednisone and robaxin Rxs then  In ED he was not coherent but became alert and talking after D50 given GCS 6 tio 11  Past Medical History:  Diagnosis Date  . Alcoholism (HCC)   . Arthritis   . Broken neck (HCC)   . Drug abuse    crack  . GERD (gastroesophageal reflux disease)   . H/O tinnitus   . Hydrocele   . Hypertension    no meds  . Loose, teeth     Past Surgical History:  Procedure Laterality Date  . HEMORRHOID SURGERY     as a teenager  . HYDROCELE EXCISION Right 04/15/2014   Procedure: RIGHT HYDROCELECTOMY ADULT;  Surgeon: Crist FatBenjamin W Herrick, MD;  [facility];  Service: Urology;  Laterality: Right;  . NECK SURGERY  1996  . TESTICLE SURGERY      Family History  Problem Relation Age of Onset  . Colon cancer Paternal Uncle   . Heart disease Mother     father    Social History  Substance Use Topics  . Smoking status: Current Every Day Smoker    Packs/day: 0.25   Types: Cigarettes  . Smokeless tobacco: Never Used  . Alcohol use 50.4 oz/week    84 Cans of beer per week    Prior to Admission medications   Medication Sig Start Date End Date Taking? Authorizing Provider  docusate sodium (COLACE) 100 MG capsule Take 1 capsule (100 mg total) by mouth 2 (two) times daily as needed (take to keep stool soft.). 04/15/14   Crist FatBenjamin W Herrick, MD  methocarbamol (ROBAXIN) 500 MG tablet Take 1 tablet (500 mg total) by mouth 2 (two) times daily. 07/04/16   Earley FavorGail Schulz, NP  oxyCODONE (ROXICODONE) 5 MG immediate release tablet Take 1 tablet (5 mg total) by mouth every 4 (four) hours as needed. 04/15/14   Crist FatBenjamin W Herrick, MD  oxyCODONE-acetaminophen (PERCOCET/ROXICET) 5-325 MG tablet Take 2 tablets by mouth every 4 (four) hours as needed for severe pain. Patient taking differently: Take 2 tablets by mouth every 4 (four) hours as needed (for pain).  06/23/16   Maia PlanJoshua G Long, MD  oxyCODONE-acetaminophen (PERCOCET/ROXICET) 5-325 MG tablet Take 1 tablet by mouth every 4 (four) hours as needed (for pain).     Historical Provider, MD  predniSONE (  DELTASONE) 20 MG tablet 3 Tabs PO Days 1-3, then 2 tabs PO Days 4-6, then 1 tab PO Day 7-9, then Half Tab PO Day 10-12 07/04/16   Earley Favor, NP    Current Facility-Administered Medications  Medication Dose Route Frequency Provider Last Rate Last Dose  . 0.9 %  sodium chloride infusion  10 mL/hr Intravenous Once Lyndal Pulley, MD      . 0.9 %  sodium chloride infusion  250 mL Intravenous PRN Nelda Bucks, MD      . Melene Muller ON 08/11/2016] cefTRIAXone (ROCEPHIN) 1 g in dextrose 5 % 50 mL IVPB  1 g Intravenous Q24H Nelda Bucks, MD      . dextrose 5 %-0.45 % sodium chloride infusion   Intravenous Continuous Nelda Bucks, MD 50 mL/hr at 08/10/16 1543    . fentaNYL (SUBLIMAZE) injection 100 mcg  100 mcg Intravenous Q15 min PRN Nelda Bucks, MD      . fentaNYL (SUBLIMAZE) injection 100 mcg  100 mcg Intravenous Q2H  PRN Nelda Bucks, MD      . folic acid injection 1 mg  1 mg Intravenous Daily Nelda Bucks, MD      . Ketamine HCl-Sodium Chloride 100-0.9 MG/10ML-% SOSY           . octreotide (SANDOSTATIN) 500 mcg in sodium chloride 0.9 % 250 mL (2 mcg/mL) infusion  50 mcg/hr Intravenous Continuous Lyndal Pulley, MD 25 mL/hr at 08/10/16 1630 50 mcg/hr at 08/10/16 1630  . pantoprazole (PROTONIX) 80 mg in sodium chloride 0.9 % 100 mL IVPB  80 mg Intravenous Once Nelda Bucks, MD      . propofol (DIPRIVAN) 1000 MG/100ML infusion           . propofol (DIPRIVAN) 1000 MG/100ML infusion  0-50 mcg/kg/min Intravenous Continuous Nelda Bucks, MD      . thiamine (B-1) injection 100 mg  100 mg Intravenous Daily Nelda Bucks, MD       Current Outpatient Prescriptions  Medication Sig Dispense Refill  . docusate sodium (COLACE) 100 MG capsule Take 1 capsule (100 mg total) by mouth 2 (two) times daily as needed (take to keep stool soft.). 60 capsule 0  . methocarbamol (ROBAXIN) 500 MG tablet Take 1 tablet (500 mg total) by mouth 2 (two) times daily. 20 tablet 0  . oxyCODONE (ROXICODONE) 5 MG immediate release tablet Take 1 tablet (5 mg total) by mouth every 4 (four) hours as needed. 15 tablet 0  . oxyCODONE-acetaminophen (PERCOCET/ROXICET) 5-325 MG tablet Take 2 tablets by mouth every 4 (four) hours as needed for severe pain. (Patient taking differently: Take 2 tablets by mouth every 4 (four) hours as needed (for pain). ) 20 tablet 0  . oxyCODONE-acetaminophen (PERCOCET/ROXICET) 5-325 MG tablet Take 1 tablet by mouth every 4 (four) hours as needed (for pain).     . predniSONE (DELTASONE) 20 MG tablet 3 Tabs PO Days 1-3, then 2 tabs PO Days 4-6, then 1 tab PO Day 7-9, then Half Tab PO Day 10-12 20 tablet 0    Allergies as of 08/10/2016  . (No Known Allergies)     Review of Systems:    Unable to obtain - intubated     Physical Exam:  Vital signs in last 24 hours: Temp:  [96.7 F (35.9  C)-97.6 F (36.4 C)] 97.6 F (36.4 C) (01/06 1650) Pulse Rate:  [82-134] 101 (01/06 1800) Resp:  [14-24] 24 (01/06 1800) BP: (135-206)/(75-109) 163/92 (01/06 1800)  SpO2:  [94 %-100 %] 94 % (01/06 1800) FiO2 (%):  [40 %-100 %] 40 % (01/06 1728) Weight:  [182 lb 15.7 oz (83 kg)] 182 lb 15.7 oz (83 kg) (01/06 1400)    General:  Chronically ill, OG tube with dark red blood  Eyes:  Muddy sclerae. ENT:   ETT and OG tube Lungs: Clear to auscultation bilaterally. Heart:  S1S2, no rubs, murmurs, gallops. Abdomen:  soft, non-tender, .  Extremities:   no edema, clubbing Skin   bed bugs. Neuro:  sedated    Data Reviewed:   LAB RESULTS:  Recent Labs  08/10/16 1455 08/10/16 1504 08/10/16 1700  WBC 11.6*  --  9.1  HGB 6.3* 6.1* 6.5*  HCT 20.0* 18.0* 20.9*  PLT 295  --  236   BMET  Recent Labs  08/10/16 1455 08/10/16 1504 08/10/16 1700  NA 133* 134* 136  K 5.5* 5.4* 5.1  CL 102 101 104  CO2 21*  --  23  GLUCOSE 241* 239* 130*  BUN 40* 40* 43*  CREATININE 1.50* 1.70* 1.34*  CALCIUM 7.9*  --  7.2*   LFT  Recent Labs  08/10/16 1700  PROT 5.4*  ALBUMIN 2.6*  AST 24  ALT 18  ALKPHOS 57  BILITOT 0.2*   PT/INR  Recent Labs  08/10/16 1455 08/10/16 1700  LABPROT 14.2 14.9  INR 1.10 1.16    STUDIES: Dg Chest Portable 1 View  Result Date: 08/10/2016 CLINICAL DATA:  61 year old male status post central line placement. EXAM: PORTABLE CHEST 1 VIEW COMPARISON:  Chest x-ray 08/10/2014. FINDINGS: New right IJ central line with tip terminating in the mid superior vena cava. A nasogastric tube is seen extending into the stomach, however, the tip of the nasogastric tube extends below the lower margin of the image. An endotracheal tube is in place with tip 4.8 cm above the carina. Low lung volumes. Ill-defined opacity in the left lung base partially obscuring the left hemidiaphragm, concerning for airspace consolidation. Right lung appears clear. No pleural effusions. No  evidence pulmonary edema. No pneumothorax. Heart size is borderline enlarged. Upper mediastinal contours are within normal limits. IMPRESSION: 1. Support apparatus, as above. 2. Low lung volumes with probable airspace consolidation in the left lower lobe which may reflect pneumonia or sequela of aspiration. Electronically Signed   By: Trudie Reed M.D.   On: 08/10/2016 16:00   Dg Chest Portable 1 View  Result Date: 08/10/2016 CLINICAL DATA:  Status post intubation today. EXAM: PORTABLE CHEST 1 VIEW COMPARISON:  PA and lateral chest 05/09/2011. FINDINGS: Endotracheal tube is in place with tip in good position at the level of the clavicular heads. NG tube courses into the stomach. Side port is in the distal esophagus. The tube should be advanced 5 cm for better positioning. Mild left basilar atelectasis is seen. Lungs are otherwise clear. No pneumothorax or pleural effusion. IMPRESSION: ETT in good position. NG tube should be advanced approximately 5 cm for better positioning. Subsegmental atelectasis left lung base. Electronically Signed   By: Drusilla Kanner M.D.   On: 08/10/2016 15:28     PREVIOUS ENDOSCOPIES:            2015 colonoscopy cancelled due to cocaine use and htn   Thanks   LOS: 0 days   @Carl  Sena Slate, MD, [facility] @  08/10/2016, 6:19 PM

## 2016-08-10 NOTE — ED Provider Notes (Signed)
[facility]-EMERGENCY DEPT Provider Note   CSN: [id number] Arrival date & time: 08/10/16  1441     History   Chief Complaint Chief Complaint  Patient presents with  . GI Bleeding    HPI Johnny [name] is a 61 y.o. male.  The history is provided by the EMS personnel.  GI Problem  This is a new problem. Episode onset: unknown. The problem occurs constantly. The problem has not changed since onset.Associated symptoms comments: Confusion, vomiting blood, profuse bloody diarrhea. Nothing relieves the symptoms. He has tried nothing for the symptoms.    Past Medical History:  Diagnosis Date  . Alcoholism (HCC)   . Arthritis   . Broken neck (HCC)   . Drug abuse    crack  . GERD (gastroesophageal reflux disease)   . H/O tinnitus   . Hydrocele   . Hypertension    no meds  . Loose, teeth     There are no active problems to display for this patient.   Past Surgical History:  Procedure Laterality Date  . HEMORRHOID SURGERY     as a teenager  . HYDROCELE EXCISION Right 04/15/2014   Procedure: RIGHT HYDROCELECTOMY ADULT;  Surgeon: Crist Fat, MD;  Location: [facility];  Service: Urology;  Laterality: Right;  . NECK SURGERY  1996  . TESTICLE SURGERY         Home Medications    Prior to Admission medications   Medication Sig Start Date End Date Taking? Authorizing Provider  docusate sodium (COLACE) 100 MG capsule Take 1 capsule (100 mg total) by mouth 2 (two) times daily as needed (take to keep stool soft.). Patient not taking: Reported on 06/26/2016 04/15/14   Crist Fat, MD  methocarbamol (ROBAXIN) 500 MG tablet Take 1 tablet (500 mg total) by mouth 2 (two) times daily. 07/04/16   Earley Favor, NP  oxyCODONE (ROXICODONE) 5 MG immediate release tablet Take 1 tablet (5 mg total) by mouth every 4 (four) hours as needed. Patient not taking: Reported on 06/26/2016 04/15/14   Crist Fat, MD  oxyCODONE-acetaminophen (PERCOCET/ROXICET) 5-325 MG  tablet Take 2 tablets by mouth every 4 (four) hours as needed for severe pain. Patient not taking: Reported on 06/26/2016 06/23/16   Maia Plan, MD  oxyCODONE-acetaminophen (PERCOCET/ROXICET) 5-325 MG tablet Take 2 tablets by mouth every 4 (four) hours as needed for severe pain (pain).    Historical Provider, MD  predniSONE (DELTASONE) 20 MG tablet 3 Tabs PO Days 1-3, then 2 tabs PO Days 4-6, then 1 tab PO Day 7-9, then Half Tab PO Day 10-12 07/04/16   Earley Favor, NP    Family History Family History  Problem Relation Age of Onset  . Colon cancer Paternal Uncle   . Heart disease Mother     father    Social History Social History  Substance Use Topics  . Smoking status: Current Every Day Smoker    Packs/day: 0.25    Types: Cigarettes  . Smokeless tobacco: Never Used  . Alcohol use 50.4 oz/week    84 Cans of beer per week     Allergies   Patient has no known allergies.   Review of Systems Review of Systems  Unable to perform ROS: Mental status change   LEVEL 5 EXCEPTION TO HISTORY: acuity of condition    Physical Exam Updated Vital Signs BP 145/79   Temp (!) 96.7 F (35.9 C) (Axillary)   Resp 17   Wt 182 lb 15.7  oz (83 kg)   SpO2 100%   BMI 23.49 kg/m   Physical Exam  Constitutional: He appears lethargic. He appears toxic. He appears distressed.  Disheveled, covered in melanotic stool  HENT:  Head: Normocephalic and atraumatic.  Nose: Nose normal.  Blood in mouth and on side of face from vomiting  Eyes: Conjunctivae are normal.  Neck: Neck supple. No tracheal deviation present.  Cardiovascular: Regular rhythm and normal heart sounds.  Tachycardia present.   Pulmonary/Chest: Effort normal and breath sounds normal. No respiratory distress.  Abdominal: Soft. He exhibits distension (mild). There is no tenderness.  Neurological: He appears lethargic. He is disoriented. GCS eye subscore is 2. GCS verbal subscore is 4. GCS motor subscore is 5.  Patient initial  GCS 6 improved to 11 after D50  Skin: Skin is dry.  Psychiatric: His speech is delayed and slurred. He is slowed.     ED Treatments / Results  Labs (all labs ordered are listed, but only abnormal results are displayed) Labs Reviewed  COMPREHENSIVE METABOLIC PANEL - Abnormal; Notable for the following:       Result Value   Sodium 133 (*)    Potassium 5.5 (*)    CO2 21 (*)    Glucose, Bld 241 (*)    BUN 40 (*)    Creatinine, Ser 1.50 (*)    Calcium 7.9 (*)    Total Protein 5.4 (*)    Albumin 2.7 (*)    Total Bilirubin <0.1 (*)    GFR calc non Af Amer 48 (*)    GFR calc Af Amer 56 (*)    All other components within normal limits  CBC WITH DIFFERENTIAL/PLATELET - Abnormal; Notable for the following:    WBC 11.6 (*)    RBC 2.46 (*)    Hemoglobin 6.3 (*)    HCT 20.0 (*)    MCH 25.6 (*)    RDW 18.0 (*)    Neutro Abs 8.6 (*)    All other components within normal limits  CBC - Abnormal; Notable for the following:    RBC 2.55 (*)    Hemoglobin 6.5 (*)    HCT 20.9 (*)    MCH 25.5 (*)    RDW 17.9 (*)    All other components within normal limits  I-STAT CHEM 8, ED - Abnormal; Notable for the following:    Sodium 134 (*)    Potassium 5.4 (*)    BUN 40 (*)    Creatinine, Ser 1.70 (*)    Glucose, Bld 239 (*)    Calcium, Ion 1.08 (*)    Hemoglobin 6.1 (*)    HCT 18.0 (*)    All other components within normal limits  I-STAT CG4 LACTIC ACID, ED - Abnormal; Notable for the following:    Lactic Acid, Venous 3.46 (*)    All other components within normal limits  CBG MONITORING, ED - Abnormal; Notable for the following:    Glucose-Capillary 140 (*)    All other components within normal limits  I-STAT ARTERIAL BLOOD GAS, ED - Abnormal; Notable for the following:    pH, Arterial 7.321 (*)    pCO2 arterial 55.2 (*)    pO2, Arterial 535.0 (*)    Bicarbonate 28.5 (*)    All other components within normal limits  CULTURE, BLOOD (ROUTINE X 2)  CULTURE, BLOOD (ROUTINE X 2)  CULTURE,  BLOOD (ROUTINE X 2)  CULTURE, BLOOD (ROUTINE X 2)  CULTURE, RESPIRATORY (NON-EXPECTORATED)  LIPASE, BLOOD  APTT  PROTIME-INR  PROTIME-INR  APTT  AMMONIA  BASIC METABOLIC PANEL  COMPREHENSIVE METABOLIC PANEL  AMYLASE  LIPASE, BLOOD  LACTIC ACID, PLASMA  LACTIC ACID, PLASMA  CBC  CBC  CBC  URINALYSIS, ROUTINE W REFLEX MICROSCOPIC  BLOOD GAS, ARTERIAL  TRIGLYCERIDES  LACTATE DEHYDROGENASE  URINE DRUGS OF ABUSE SCREEN W ALC, ROUTINE (REF LAB)  HIV ANTIBODY (ROUTINE TESTING)  HEPATITIS PANEL, ACUTE  CORTISOL  TYPE AND SCREEN  PREPARE RBC (CROSSMATCH)  ABO/RH  TYPE AND SCREEN    EKG  EKG Interpretation  Date/Time:  Saturday August 10 2016 15:05:29 EST Ventricular Rate:  130 PR Interval:    QRS Duration: 87 QT Interval:  315 QTC Calculation: 464 R Axis:   44 Text Interpretation:  Sinus tachycardia Nonspecific T abnormalities, lateral leads Confirmed by Paislea Hatton MD, Jewell Ryans [phone]) on 08/10/2016 3:12:15 PM       Radiology Dg Chest Portable 1 View  Result Date: 08/10/2016 CLINICAL DATA:  61 year old male status post central line placement. EXAM: PORTABLE CHEST 1 VIEW COMPARISON:  Chest x-ray 08/10/2014. FINDINGS: New right IJ central line with tip terminating in the mid superior vena cava. A nasogastric tube is seen extending into the stomach, however, the tip of the nasogastric tube extends below the lower margin of the image. An endotracheal tube is in place with tip 4.8 cm above the carina. Low lung volumes. Ill-defined opacity in the left lung base partially obscuring the left hemidiaphragm, concerning for airspace consolidation. Right lung appears clear. No pleural effusions. No evidence pulmonary edema. No pneumothorax. Heart size is borderline enlarged. Upper mediastinal contours are within normal limits. IMPRESSION: 1. Support apparatus, as above. 2. Low lung volumes with probable airspace consolidation in the left lower lobe which may reflect pneumonia or sequela of  aspiration. Electronically Signed   By: Trudie Reed M.D.   On: 08/10/2016 16:00   Dg Chest Portable 1 View  Result Date: 08/10/2016 CLINICAL DATA:  Status post intubation today. EXAM: PORTABLE CHEST 1 VIEW COMPARISON:  PA and lateral chest 05/09/2011. FINDINGS: Endotracheal tube is in place with tip in good position at the level of the clavicular heads. NG tube courses into the stomach. Side port is in the distal esophagus. The tube should be advanced 5 cm for better positioning. Mild left basilar atelectasis is seen. Lungs are otherwise clear. No pneumothorax or pleural effusion. IMPRESSION: ETT in good position. NG tube should be advanced approximately 5 cm for better positioning. Subsegmental atelectasis left lung base. Electronically Signed   By: Drusilla Kanner M.D.   On: 08/10/2016 15:28    Procedures Procedure Name: Intubation Date/Time: 08/10/2016 3:21 PM Performed by: Lyndal Pulley Pre-anesthesia Checklist: Patient identified, Emergency Drugs available, Suction available, Patient being monitored and Timeout performed Oxygen Delivery Method: Non-rebreather mask Preoxygenation: Pre-oxygenation with 100% oxygen Intubation Type: Rapid sequence Laryngoscope Size: Glidescope and 3 Grade View: Grade II Tube size: 7.5 mm Number of attempts: 1 Airway Equipment and Method: Stylet and Video-laryngoscopy Placement Confirmation: ETT inserted through vocal cords under direct vision,  CO2 detector and Breath sounds checked- equal and bilateral Secured at: 25 cm Tube secured with: ETT holder Dental Injury: Teeth and Oropharynx as per pre-operative assessment  Difficulty Due To: Difficulty was anticipated, Difficult Airway- due to reduced neck mobility and Difficult Airway- due to dentition Future Recommendations: Recommend- induction with short-acting agent, and alternative techniques readily available       Patient did not tolerate ketamine well and had adverse reaction, did not  completely dissociate with  2 mg/kg   CRITICAL CARE Performed by: Lyndal Pulley Total critical care time: 30 minutes Critical care time was exclusive of separately billable procedures and treating other patients. Critical care was necessary to treat or prevent imminent or life-threatening deterioration. Critical care was time spent personally by me on the following activities: development of treatment plan with patient and/or surrogate as well as nursing, discussions with consultants, evaluation of patient's response to treatment, examination of patient, obtaining history from patient or surrogate, ordering and performing treatments and interventions, ordering and review of laboratory studies, ordering and review of radiographic studies, pulse oximetry and re-evaluation of patient's condition.  (including critical care time)  Medications Ordered in ED Medications  ketamine 100 mg in normal saline 10 mL (10mg /mL) syringe (not administered)  pantoprazole (PROTONIX) 80 mg in sodium chloride 0.9 % 100 mL IVPB (not administered)  cefTRIAXone (ROCEPHIN) 1 g in dextrose 5 % 50 mL IVPB (not administered)  octreotide (SANDOSTATIN) 2 mcg/mL load via infusion 50 mcg (not administered)    And  octreotide (SANDOSTATIN) 500 mcg in sodium chloride 0.9 % 250 mL (2 mcg/mL) infusion (not administered)  Ketamine HCl-Sodium Chloride 100-0.9 MG/10ML-% SOSY (not administered)  dextrose 50 % solution (1 ampule Intravenous Given 08/10/16 1442)  ketamine (KETALAR) injection (250 mg Intravenous Given 08/10/16 1500)  propofol (DIPRIVAN) 1000 MG/100ML infusion (not administered)  propofol (DIPRIVAN) 1000 MG/100ML infusion (not administered)  0.9 %  sodium chloride infusion (not administered)  dextrose 5 %-0.45 % sodium chloride infusion (not administered)     Initial Impression / Assessment and Plan / ED Course  I have reviewed the triage vital signs and the nursing notes.  Pertinent labs & imaging results that were  available during my care of the patient were reviewed by me and considered in my medical decision making (see chart for details).  Clinical Course     61 year old male presents in extremis with bright red hematemesis and melena with encephalopathy. He has GCS of 6 on arrival with a depressed glucose, was given D50 without much improvement, I am concerned for rapid transit upper GI bleed due to variceal bleeding clinically. Patient was intubated for airway protection, started on octreotide and Protonix with Rocephin for prophylactic coverage of SBP.   Patient had adverse reaction of agitation to RSI dose ketamine and I would not recommend using this agent in the future.  He has a hemoglobin of 6 and given concern for active bleeding, 2 units of unmatched blood were ordered, patient was fluid resuscitated and started on D5 half-normal saline infusion due to hypoglycemia of unclear etiology. Critical care was consulted and Dr. Tyson Alias evaluated the patient in the emergency department for admission. [facility] was available for consultation in the emergency department to perform emergent endoscopy.  Patient remained in critical condition throughout his emergency department course.  Final Clinical Impressions(s) / ED Diagnoses   Final diagnoses:  Acute upper GI bleeding  Encephalopathy  Blood loss anemia    New Prescriptions New Prescriptions   No medications on file     Lyndal Pulley, MD 08/10/16 1610

## 2016-08-10 NOTE — ED Notes (Signed)
Attempted to give report but RN reports pt is not coming to this assigned bed.

## 2016-08-10 NOTE — ED Notes (Signed)
Per EMS pt not seen for 3 days. Found with bloody emesis and bloody stool. GCS 7. 20G PIv LFA. CBG 56. Know Crack user.

## 2016-08-10 NOTE — H&P (Addendum)
PULMONARY / CRITICAL CARE MEDICINE   Name: Johnny [name] MRN: [id number] DOB: 09-26-1955    ADMISSION DATE:  08/10/2016 CONSULTATION DATE:  1/6    REFERRING MD:  ED MD   CHIEF COMPLAINT:  Gi bleed, resp failure  HISTORY OF PRESENT ILLNESS:  61 yr old AAM, limited PMH known. Known to be drug abuser with cocaine and etoh, was scheduled for colonoscopy in past and declined as high on cocaine. Found down, unknown period of time. Found with blood in mouth, change in ms. ETT placed. Rapid transit blood reported by EDP. Bright red blood above on ngt and dark from below. With HTN. Placed on propofol, FGound with low HGB and bed bugs Glu 20 \ PAST MEDICAL HISTORY :  He  has a past medical history of Alcoholism ([place]); Arthritis; Broken neck ([place]); Drug abuse; GERD (gastroesophageal reflux disease); H/O tinnitus; Hydrocele; Hypertension; and Loose, teeth.  PAST SURGICAL HISTORY: He  has a past surgical history that includes Neck surgery (1996); Testicle surgery; Hemorrhoid surgery; and Hydrocele surgery (Right, 04/15/2014).  No Known Allergies  No current facility-administered medications on file prior to encounter.    Current Outpatient Prescriptions on File Prior to Encounter  Medication Sig  . docusate sodium (COLACE) 100 MG capsule Take 1 capsule (100 mg total) by mouth 2 (two) times daily as needed (take to keep stool soft.).  Marland Kitchen methocarbamol (ROBAXIN) 500 MG tablet Take 1 tablet (500 mg total) by mouth 2 (two) times daily.  Marland Kitchen oxyCODONE (ROXICODONE) 5 MG immediate release tablet Take 1 tablet (5 mg total) by mouth every 4 (four) hours as needed.  Marland Kitchen oxyCODONE-acetaminophen (PERCOCET/ROXICET) 5-325 MG tablet Take 2 tablets by mouth every 4 (four) hours as needed for severe pain. (Patient taking differently: Take 2 tablets by mouth every 4 (four) hours as needed (for pain). )  . oxyCODONE-acetaminophen (PERCOCET/ROXICET) 5-325 MG tablet Take 1 tablet by mouth every 4 (four) hours as needed (for  pain).   . predniSONE (DELTASONE) 20 MG tablet 3 Tabs PO Days 1-3, then 2 tabs PO Days 4-6, then 1 tab PO Day 7-9, then Half Tab PO Day 10-12    FAMILY HISTORY:  His indicated that the status of his mother is unknown. He indicated that the status of his paternal uncle is unknown.    SOCIAL HISTORY: He  reports that he has been smoking Cigarettes.  He has been smoking about 0.25 packs per day. He has never used smokeless tobacco. He reports that he drinks about 50.4 oz of alcohol per week . He reports that he uses drugs.  REVIEW OF SYSTEMS:   unable  SUBJECTIVE:  ETT GI consult Line placed  VITAL SIGNS: BP 179/94   Pulse 116   Temp (!) 96.7 F (35.9 C) (Axillary)   Resp 21   Wt 83 kg (182 lb 15.7 oz)   SpO2 100%   BMI 23.49 kg/m   HEMODYNAMICS:    VENTILATOR SETTINGS: Vent Mode: PRVC FiO2 (%):  [100 %] 100 % Set Rate:  [14 bmp] 14 bmp Vt Set:  [620 mL] 620 mL PEEP:  [5 cmH20] 5 cmH20 Plateau Pressure:  [19 cmH20] 19 cmH20  INTAKE / OUTPUT: No intake/output data recorded.  PHYSICAL EXAMINATION: General:  Unkempt vent Neuro:  Perr, just paralyzed and sedated for ETT by EDP  HEENT:  jvd noted Cardiovascular: s1 s2 RRT no m Lungs:  ronchi Abdomen:  Soft, BS low, no r/g, not distended, min ascites Musculoskeletal: edema 2 plus Skin:  Bed bugs  LABS:  BMET  Recent Labs Lab 08/10/16 1455 08/10/16 1504  NA 133* 134*  K 5.5* 5.4*  CL 102 101  CO2 21*  --   BUN 40* 40*  CREATININE 1.50* 1.70*  GLUCOSE 241* 239*    Electrolytes  Recent Labs Lab 08/10/16 1455  CALCIUM 7.9*    CBC  Recent Labs Lab 08/10/16 1455 08/10/16 1504  WBC 11.6*  --   HGB 6.3* 6.1*  HCT 20.0* 18.0*  PLT 295  --     Coag's  Recent Labs Lab 08/10/16 1455  APTT 25  INR 1.10    Sepsis Markers  Recent Labs Lab 08/10/16 1504  LATICACIDVEN 3.46*    ABG No results for input(s): PHART, PCO2ART, PO2ART in the last 168 hours.  Liver Enzymes  Recent  Labs Lab 08/10/16 1455  AST 28  ALT 18  ALKPHOS 53  BILITOT <0.1*  ALBUMIN 2.7*    Cardiac Enzymes No results for input(s): TROPONINI, PROBNP in the last 168 hours.  Glucose  Recent Labs Lab 08/10/16 1549  GLUCAP 140*    Imaging Dg Chest Portable 1 View  Result Date: 08/10/2016 CLINICAL DATA:  Status post intubation today. EXAM: PORTABLE CHEST 1 VIEW COMPARISON:  PA and lateral chest 05/09/2011. FINDINGS: Endotracheal tube is in place with tip in good position at the level of the clavicular heads. NG tube courses into the stomach. Side port is in the distal esophagus. The tube should be advanced 5 cm for better positioning. Mild left basilar atelectasis is seen. Lungs are otherwise clear. No pneumothorax or pleural effusion. IMPRESSION: ETT in good position. NG tube should be advanced approximately 5 cm for better positioning. Subsegmental atelectasis left lung base. Electronically Signed   By: Inge Rise M.D.   On: 08/10/2016 15:28     STUDIES:  1/6 ct head>>>  CULTURES: Sputum 1/6>>> BC 1/6>>>  ANTIBIOTICS: Ceftriaxone 1/6>>>  SIGNIFICANT EVENTS: 1/6- GI bleed, change in MS, ett   LINES/TUBES: 1/6 ett>>> 1/6 rt ij cordis>>>  ASSESSMENT / PLAN:  PULMONARY A: Acute resp failure, asp likely P:   8 cc/kg, rate 18, 100%, peep 5, abg pcxr stat No sbt  CARDIOVASCULAR A:  HTN emergency likely, r/o cocaine intox P:  ecg neg Prop Avoiding beta blocker ( can use labetolol x1 ) Adding nicardipine now to MAP 25% reduction Head CT  RENAL A:   MIld hyper k, ARF, ATN, hypovolemia P:   Chem q8h Correct presumed acidosis k repeat Pos balance  GASTROINTESTINAL A:   GI bleed, r/o ulcer, r/o variseal other P:   Octreotide ppi drip NPO GI consult Lactic, ldh, amy, lip Acute hep panel OGT suction  HEMATOLOGIC A:   Anemia, GI bleed R/o liver dz coagulaopthty P:  scd Cbc after 2 units stat coags stat  INFECTIOUS A:   R/o SBP risk, asp   P:   Empiric ceftriaxone BC Sputum Get hiv  ENDOCRINE A:   Hypoglycemia P:   cbg d50 d5 in fluids cortisol  NEUROLOGIC A:   Change in MS, met enceph R/o hypoglcyemic injury / anoxia R/o ICH with cocaine P:   RASS goal: -3 Prop required Unable wake up Stat head cT   FAMILY  - Updates: no family  - Inter-disciplinary family meet or Palliative Care meeting due by:  1/13  Ccm time 85 min  Lavon Paganini. Titus Mould, MD, FACP Pgr: [facility]  Pulmonary and [place] Pager: [phone])  [phone]  08/10/2016, 3:53 PM

## 2016-08-10 NOTE — Procedures (Signed)
Central Venous Catheter Insertion Procedure Note Chancy Hurterlijah J Salvo [id number] 1955-12-31  Procedure: Insertion of Central Venous Catheter Indications: Assessment of intravascular volume, Drug and/or fluid administration, Frequent blood sampling and rapid tx  Procedure Details Consent: Unable to obtain consent because of emergent medical necessity. Time Out: Verified patient identification, verified procedure, site/side was marked, verified correct patient position, special equipment/implants available, medications/allergies/relevent history reviewed, required imaging and test results available.  Performed  Maximum sterile technique was used including antiseptics, cap, gloves, gown, hand hygiene, mask and sheet. Skin prep: Chlorhexidine; local anesthetic administered A antimicrobial bonded/coated single lumen cordis then a  triple lumen catheter through was placed in the right internal jugular vein using the Seldinger technique.  Evaluation Blood flow good Complications: No apparent complications Patient did tolerate procedure well. Chest X-ray ordered to verify placement.  CXR: pending.  Nelda BucksFEINSTEIN,DANIEL J. 08/10/2016, 3:45 PM  US  Mcarthur Rossettianiel J. Tyson [name], MD, FACP Pgr: [phone] [facility]

## 2016-08-11 ENCOUNTER — Encounter (HOSPITAL_COMMUNITY): Payer: Self-pay

## 2016-08-11 ENCOUNTER — Encounter (HOSPITAL_COMMUNITY)
Admission: EM | Disposition: A | Discharge status: Skilled Nursing Facility | Payer: Self-pay | Source: Home / Self Care | Attending: Internal Medicine

## 2016-08-11 DIAGNOSIS — K260 Acute duodenal ulcer with hemorrhage: Secondary | ICD-10-CM

## 2016-08-11 DIAGNOSIS — K2210 Ulcer of esophagus without bleeding: Secondary | ICD-10-CM

## 2016-08-11 DIAGNOSIS — K922 Gastrointestinal hemorrhage, unspecified: Secondary | ICD-10-CM

## 2016-08-11 HISTORY — PX: ESOPHAGOGASTRODUODENOSCOPY: SHX5428

## 2016-08-11 LAB — CBC
HEMATOCRIT: 20.9 % — AB (ref 39.0–52.0)
HEMATOCRIT: 23.7 % — AB (ref 39.0–52.0)
HEMATOCRIT: 22.9 % — AB (ref 39.0–52.0)
HEMATOCRIT: 25.9 % — AB (ref 39.0–52.0)
HEMOGLOBIN: 7.5 g/dL — AB (ref 13.0–17.0)
HEMOGLOBIN: 7.1 g/dL — AB (ref 13.0–17.0)
Hemoglobin: 6.5 g/dL — CL (ref 13.0–17.0)
Hemoglobin: 8.3 g/dL — ABNORMAL LOW (ref 13.0–17.0)
MCH: 25.5 pg — ABNORMAL LOW (ref 26.0–34.0)
MCH: 26.3 pg (ref 26.0–34.0)
MCH: 26.1 pg (ref 26.0–34.0)
MCH: 27.1 pg (ref 26.0–34.0)
MCHC: 31.1 g/dL (ref 30.0–36.0)
MCHC: 31.6 g/dL (ref 30.0–36.0)
MCHC: 31 g/dL (ref 30.0–36.0)
MCHC: 32 g/dL (ref 30.0–36.0)
MCV: 82 fL (ref 78.0–100.0)
MCV: 83.2 fL (ref 78.0–100.0)
MCV: 84.2 fL (ref 78.0–100.0)
MCV: 84.6 fL (ref 78.0–100.0)
Platelets: 236 10*3/uL (ref 150–400)
Platelets: 257 10*3/uL (ref 150–400)
Platelets: 243 10*3/uL (ref 150–400)
Platelets: 197 10*3/uL (ref 150–400)
RBC: 2.55 MIL/uL — ABNORMAL LOW (ref 4.22–5.81)
RBC: 2.85 MIL/uL — AB (ref 4.22–5.81)
RBC: 2.72 MIL/uL — ABNORMAL LOW (ref 4.22–5.81)
RBC: 3.06 MIL/uL — ABNORMAL LOW (ref 4.22–5.81)
RDW: 17.9 % — AB (ref 11.5–15.5)
RDW: 16.9 % — ABNORMAL HIGH (ref 11.5–15.5)
RDW: 17.3 % — AB (ref 11.5–15.5)
RDW: 16 % — AB (ref 11.5–15.5)
WBC: 9.1 10*3/uL (ref 4.0–10.5)
WBC: 12.7 10*3/uL — ABNORMAL HIGH (ref 4.0–10.5)
WBC: 12.1 10*3/uL — AB (ref 4.0–10.5)
WBC: 13.8 10*3/uL — AB (ref 4.0–10.5)

## 2016-08-11 LAB — BASIC METABOLIC PANEL
ANION GAP: 6 (ref 5–15)
ANION GAP: 4 — AB (ref 5–15)
ANION GAP: 5 (ref 5–15)
BUN: 37 mg/dL — AB (ref 6–20)
BUN: 38 mg/dL — AB (ref 6–20)
BUN: 40 mg/dL — AB (ref 6–20)
CHLORIDE: 104 mmol/L (ref 101–111)
CHLORIDE: 108 mmol/L (ref 101–111)
CO2: 26 mmol/L (ref 22–32)
CO2: 26 mmol/L (ref 22–32)
CO2: 26 mmol/L (ref 22–32)
Calcium: 7.8 mg/dL — ABNORMAL LOW (ref 8.9–10.3)
Calcium: 7.9 mg/dL — ABNORMAL LOW (ref 8.9–10.3)
Calcium: 8 mg/dL — ABNORMAL LOW (ref 8.9–10.3)
Chloride: 105 mmol/L (ref 101–111)
Creatinine, Ser: 1.43 mg/dL — ABNORMAL HIGH (ref 0.61–1.24)
Creatinine, Ser: 1.76 mg/dL — ABNORMAL HIGH (ref 0.61–1.24)
Creatinine, Ser: 2.43 mg/dL — ABNORMAL HIGH (ref 0.61–1.24)
GFR calc Af Amer: 59 mL/min — ABNORMAL LOW (ref >60)
GFR calc Af Amer: 46 mL/min — ABNORMAL LOW (ref >60)
GFR calc Af Amer: 31 mL/min — ABNORMAL LOW (ref >60)
GFR calc non Af Amer: 40 mL/min — ABNORMAL LOW (ref >60)
GFR, EST NON AFRICAN AMERICAN: 51 mL/min — AB (ref >60)
GFR, EST NON AFRICAN AMERICAN: 27 mL/min — AB (ref >60)
GLUCOSE: 194 mg/dL — AB (ref 65–99)
GLUCOSE: 106 mg/dL — AB (ref 65–99)
GLUCOSE: 165 mg/dL — AB (ref 65–99)
POTASSIUM: 6.5 mmol/L — AB (ref 3.5–5.1)
POTASSIUM: 5.6 mmol/L — AB (ref 3.5–5.1)
POTASSIUM: 6.3 mmol/L — AB (ref 3.5–5.1)
Sodium: 136 mmol/L (ref 135–145)
Sodium: 138 mmol/L (ref 135–145)
Sodium: 136 mmol/L (ref 135–145)

## 2016-08-11 LAB — GLUCOSE, CAPILLARY
GLUCOSE-CAPILLARY: 148 mg/dL — AB (ref 65–99)
GLUCOSE-CAPILLARY: 167 mg/dL — AB (ref 65–99)
Glucose-Capillary: 117 mg/dL — ABNORMAL HIGH (ref 65–99)
Glucose-Capillary: 172 mg/dL — ABNORMAL HIGH (ref 65–99)
Glucose-Capillary: 189 mg/dL — ABNORMAL HIGH (ref 65–99)
Glucose-Capillary: 97 mg/dL (ref 65–99)
Glucose-Capillary: 99 mg/dL (ref 65–99)

## 2016-08-11 LAB — TYPE AND SCREEN
BLOOD PRODUCT EXPIRATION DATE: 201801132359
BLOOD PRODUCT EXPIRATION DATE: 201801132359
BLOOD PRODUCT EXPIRATION DATE: 201801132359
Blood Product Expiration Date: 201801122359
ISSUE DATE / TIME: 201801061558
ISSUE DATE / TIME: 201801061558
ISSUE DATE / TIME: 201801041039
UNIT TYPE AND RH: 6200
UNIT TYPE AND RH: 9500
Unit Type and Rh: 6200
Unit Type and Rh: 9500

## 2016-08-11 LAB — PREPARE RBC (CROSSMATCH)

## 2016-08-11 SURGERY — EGD (ESOPHAGOGASTRODUODENOSCOPY)
Anesthesia: Moderate Sedation

## 2016-08-11 MED ORDER — EPINEPHRINE PF 1 MG/10ML IJ SOSY
PREFILLED_SYRINGE | INTRAMUSCULAR | Status: AC
  Filled 2016-08-11: qty 10

## 2016-08-11 MED ORDER — SODIUM CHLORIDE 0.9 % IV SOLN
1.0000 g | Freq: Once | INTRAVENOUS | Status: AC
  Administered 2016-08-11: 1 g via INTRAVENOUS
  Filled 2016-08-11: qty 10

## 2016-08-11 MED ORDER — DIPHENHYDRAMINE HCL 50 MG/ML IJ SOLN
INTRAMUSCULAR | Status: AC
  Filled 2016-08-11: qty 1

## 2016-08-11 MED ORDER — DEXTROSE 50 % IV SOLN
1.0000 | Freq: Once | INTRAVENOUS | Status: AC
  Administered 2016-08-11: 50 mL via INTRAVENOUS
  Filled 2016-08-11: qty 50

## 2016-08-11 MED ORDER — SODIUM BICARBONATE 8.4 % IV SOLN
50.0000 meq | Freq: Once | INTRAVENOUS | Status: AC
  Administered 2016-08-11: 50 meq via INTRAVENOUS
  Filled 2016-08-11: qty 50

## 2016-08-11 MED ORDER — MIDAZOLAM HCL 10 MG/2ML IJ SOLN
INTRAMUSCULAR | Status: DC | PRN
  Administered 2016-08-11: 2 mg via INTRAVENOUS

## 2016-08-11 MED ORDER — FENTANYL CITRATE (PF) 100 MCG/2ML IJ SOLN
INTRAMUSCULAR | Status: AC
  Filled 2016-08-11: qty 2

## 2016-08-11 MED ORDER — INSULIN ASPART 100 UNIT/ML IV SOLN
10.0000 [IU] | Freq: Once | INTRAVENOUS | Status: AC
  Administered 2016-08-11: 10 [IU] via INTRAVENOUS

## 2016-08-11 MED ORDER — CALCIUM GLUCONATE 10 % IV SOLN
1.0000 g | Freq: Once | INTRAVENOUS | Status: AC
  Administered 2016-08-11: 1 g via INTRAVENOUS
  Filled 2016-08-11: qty 10

## 2016-08-11 MED ORDER — SODIUM BICARBONATE 8.4 % IV SOLN
INTRAVENOUS | Status: DC
  Administered 2016-08-11 – 2016-08-12 (×3): via INTRAVENOUS
  Filled 2016-08-11 (×5): qty 150

## 2016-08-11 MED ORDER — DEXTROSE 5 % IV SOLN
2.0000 g | INTRAVENOUS | Status: DC
  Administered 2016-08-11 – 2016-08-12 (×2): 2 g via INTRAVENOUS
  Filled 2016-08-11 (×3): qty 2

## 2016-08-11 MED ORDER — SODIUM POLYSTYRENE SULFONATE 15 GM/60ML PO SUSP
30.0000 g | Freq: Once | ORAL | Status: DC
  Filled 2016-08-11: qty 120

## 2016-08-11 MED ORDER — NOREPINEPHRINE BITARTRATE 1 MG/ML IV SOLN
0.0000 ug/min | INTRAVENOUS | Status: DC
  Administered 2016-08-11: 2 ug/min via INTRAVENOUS
  Administered 2016-08-11: 25 ug/min via INTRAVENOUS
  Filled 2016-08-11 (×3): qty 4

## 2016-08-11 MED ORDER — SODIUM CHLORIDE 0.9 % IJ SOLN
PREFILLED_SYRINGE | INTRAMUSCULAR | Status: DC | PRN
  Administered 2016-08-11: 7 mL

## 2016-08-11 MED ORDER — DEXTROSE 50 % IV SOLN
INTRAVENOUS | Status: AC
  Filled 2016-08-11: qty 50

## 2016-08-11 MED ORDER — MIDAZOLAM HCL 5 MG/ML IJ SOLN
INTRAMUSCULAR | Status: AC
  Filled 2016-08-11: qty 2

## 2016-08-11 MED ORDER — PHENYLEPHRINE HCL 10 MG/ML IJ SOLN: 0.0000 ug/min | INTRAVENOUS | Status: DC

## 2016-08-11 MED ORDER — CHLORHEXIDINE GLUCONATE 0.12 % MT SOLN
15.0000 mL | Freq: Two times a day (BID) | OROMUCOSAL | Status: DC
  Administered 2016-08-11 – 2016-08-12 (×2): 15 mL via OROMUCOSAL
  Filled 2016-08-11: qty 15

## 2016-08-11 MED ORDER — METOCLOPRAMIDE HCL 5 MG/ML IJ SOLN
10.0000 mg | Freq: Once | INTRAMUSCULAR | Status: AC
  Administered 2016-08-11: 10 mg via INTRAVENOUS
  Filled 2016-08-11: qty 2

## 2016-08-11 MED ORDER — ORAL CARE MOUTH RINSE
15.0000 mL | Freq: Two times a day (BID) | OROMUCOSAL | Status: DC
  Administered 2016-08-12 (×2): 15 mL via OROMUCOSAL

## 2016-08-11 MED ORDER — SODIUM CHLORIDE 0.9 % IV SOLN
INTRAVENOUS | Status: DC
  Administered 2016-08-11: 11:00:00 via INTRAVENOUS

## 2016-08-11 NOTE — Progress Notes (Signed)
eLink Physician-Brief Progress Note Patient Name: Johnny [name] DOB: 02-Jul-1956 MRN: [id number]   Date of Service  08/11/2016  HPI/Events of Note  Hyperkalemia. Potassium is been slowly trending up since admission. Now 6.5.  Patient has a Foley catheter now. Diaper was soaked prior to Foley. Minimal urine output.  Creatinine elevated at 1.43.   eICU Interventions  Kayexalate, insulin, glucose, bicarbonate, Ca gluconate.  Repeat potassium in a couple of hours.      Intervention Category Major Interventions: Arrhythmia - evaluation and management  Daneen SchickJose Angelo A De Dios 08/11/2016, 5:27 AM

## 2016-08-11 NOTE — Progress Notes (Signed)
eLink Physician-Brief Progress Note Patient Name: Johnny [name] DOB: [birth year] MRN: [id number]   Date of Service  08/11/2016  HPI/Events of Note  K drifting back up  uop very low   Intake/Output Summary (Last 24 hours) at 08/11/16 1854 Last data filed at 08/11/16 1800  Gross per 24 hour  Intake          5243.26 ml  Output             1570 ml  Net          3673.26 ml     CVP:  [13 mmHg-21 mmHg] 21 mmHg    eICU Interventions  Increase hc03 drip to 125 cc per hour for now but note cvp may limit further fluids so adjust vent to max compensate  Kayexalate x 30 gm PR      Intervention Category Major Interventions: Acid-Base disturbance - evaluation and management;Electrolyte abnormality - evaluation and management  Sandrea HughsMichael Brittin [name] 08/11/2016, 6:53 PM

## 2016-08-11 NOTE — Progress Notes (Signed)
Post procedure need of Emergent blood transfusion. 1st unit was given fast, per Dr. Bary RichardFienstein. Family updated

## 2016-08-11 NOTE — Progress Notes (Signed)
BP lowering down. MD notified. See new orders (Levo drip to keep MAP >75)

## 2016-08-11 NOTE — Brief Op Note (Signed)
08/10/2016 - 08/11/2016  12:43 PM  PATIENT:  Johnny [name]  61 y.o. male  PRE-OPERATIVE DIAGNOSIS:  Upper GI bleed  POST-OPERATIVE DIAGNOSIS:  * No post-op diagnosis entered *  PROCEDURE:  Procedure(s) with comments: ESOPHAGOGASTRODUODENOSCOPY (EGD) (N/A) - In ICU With bleeding therapy SURGEON:  Surgeon(s) and Role:    * Iva Booparl E Afrika Brick, MD - Primary    ANESTHESIA:   general  EBL:  Total I/O In: 514 [I.V.:494; NG/GT:20] Out: 200 [Emesis/NG output:200]  Findings:  1) Large duodenal bulb vessel with visible vessel - treated with EPI injection and Bicap - bleeding ensued - 4 clips placed bleeding stopped  2) Medium distal esophageal/GE junction ulcer clean-based  3) small nipple lesion in distal esophagus ? OG tube suction trauma vs other lesion  4) No varices other signs liver dz  Rec:  1) Dr. Janee Mornhompson of surgery aware and will consult 2) transfuse 2 URBC 3) dc octreotide 4) probably dc CTX 5) PPI infusion 6) serial Hgb, if signs of more bleeding I think based upon the type of bleeding I saw surgery is next step

## 2016-08-11 NOTE — Progress Notes (Signed)
eLink Physician-Brief Progress Note Patient Name: Chancy Hurterlijah J Koslowski DOB: September 28, 1955 MRN: [id number]   Date of Service  08/11/2016  HPI/Events of Note  bp soft again/ starting levophed  Intake/Output Summary (Last 24 hours) at 08/11/16 1602 Last data filed at 08/11/16 1600  Gross per 24 hour  Intake          4931.51 ml  Output             2070 ml  Net          2861.51 ml        Lab Results  Component Value Date   HGB 7.1 (L) 08/11/2016   HGB 7.5 (L) 08/11/2016   HGB 7.6 (L) 08/10/2016        eICU Interventions  Discussed with medical resident, needs cvp's to stay on top of vasc volume/ ongoing acute blood loss ? Need to go to OR this evening? (GI surg aware)     Intervention Category Major Interventions: Shock - evaluation and management  Sandrea HughsMichael Lorali Khamis 08/11/2016, 4:02 PM

## 2016-08-11 NOTE — Progress Notes (Signed)
Critical Potassium 6.3 was reported to the BB at this time

## 2016-08-11 NOTE — Progress Notes (Signed)
    Head CT neg K up Anticipate EGD later today Reglan x 1 ordered.   Iva Booparl E. Gessner, MD, [facility] [phone] (pager) [phone] after 5 PM, weekends and holidays  08/11/2016 7:48 AM

## 2016-08-11 NOTE — Progress Notes (Signed)
eLink Physician-Brief Progress Note Patient Name: Johnny [name] DOB: [birth year] MRN: [id number]   Date of Service  08/11/2016  HPI/Events of Note  Follow-up on the patient.   CBC with hemoglobin 6.5, received 2 units packed red blood cells, hemoglobin increased to 7.6.   No active bleeding noted currently. Has old blood per GI rack.   Blood pressure 120/80, heart rate 95, respiratory 21, sats 100%.   Intubated, sedated.   eICU Interventions  CBC sent just now. Cont to observe.      Intervention Category Evaluation Type: Other  Jose Bridgette Habermannngelo A De Dios 08/11/2016, 4:24 AM

## 2016-08-11 NOTE — Consult Note (Signed)
Reason for Consult: Bleeding duodenal ulcer Referring Physician: [name] is an 61 y.o. male.  HPI: I was called emergently to see Johnny [name] regarding a bleeding duodenal ulcer. He was undergoing upper endoscopy in the intensive care unit. Dr. Carlean Purl from GI identified a duodenal ulcer. This initially had some brisk bleeding but he was able to do some injections & place some clips achieving control. Johnny [name] remains on the ventilator. His blood pressure has improved since the endoscopy. He is receiving blood transfusion. Of note, he was initially admitted this morning after being found down for an unknown period of time. He has a history of alcohol and cocaine abuse as well as bedbugs.  Past Medical History:  Diagnosis Date  . Alcoholism ([place])   . Arthritis   . Broken neck ([place])   . Drug abuse    crack  . GERD (gastroesophageal reflux disease)   . H/O tinnitus   . Hydrocele   . Hypertension    no meds  . Loose, teeth     Past Surgical History:  Procedure Laterality Date  . HEMORRHOID SURGERY     as a teenager  . HYDROCELE EXCISION Right 04/15/2014   Procedure: RIGHT HYDROCELECTOMY ADULT;  Surgeon: Ardis Hughs, MD;  Location: [facility];  Service: Urology;  Laterality: Right;  . NECK SURGERY  1996  . TESTICLE SURGERY      Family History  Problem Relation Age of Onset  . Colon cancer Paternal Uncle   . Heart disease Mother     father    Social History:  reports that he has been smoking Cigarettes.  He has been smoking about 0.25 packs per day. He has never used smokeless tobacco. He reports that he drinks about 50.4 oz of alcohol per week . He reports that he uses drugs.  Allergies:  Allergies  Allergen Reactions  . Ketamine Other (See Comments)    Noted "bad reaction" during intubation 08/10/2016 - MD recommended to not use on this patient ever again.     Medications: I have reviewed the patient's current  medications.  Results for orders placed or performed during the hospital encounter of 08/10/16 (from the past 48 hour(s))  Type and screen [place]     Status: None   Collection Time: 08/10/16  2:55 PM  Result Value Ref Range   ISSUE DATE / TIME 086578469629    Blood Product Unit Number [id number]    PRODUCT CODE E0336V00    Unit Type and Rh 6200    Blood Product Expiration Date 725366440347    Blood Product Unit Number [id number]    Unit Type and Rh 3295    Blood Product Expiration Date 188416606301    ISSUE DATE / TIME 601093235573    Blood Product Unit Number [id number]    PRODUCT CODE S2831D17    Unit Type and Rh 9500    Blood Product Expiration Date 616073710626    ISSUE DATE / TIME 948546270350    Blood Product Unit Number [id number]    PRODUCT CODE E0336V00    Unit Type and Rh 9500    Blood Product Expiration Date 967893810175   Comprehensive metabolic panel     Status: Abnormal   Collection Time: 08/10/16  2:55 PM  Result Value Ref Range   Sodium 133 (L) 135 - 145 mmol/L   Potassium 5.5 (H) 3.5 - 5.1 mmol/L   Chloride 102 101 - 111 mmol/L  CO2 21 (L) 22 - 32 mmol/L   Glucose, Bld 241 (H) 65 - 99 mg/dL   BUN 40 (H) 6 - 20 mg/dL   Creatinine, Ser 1.50 (H) 0.61 - 1.24 mg/dL   Calcium 7.9 (L) 8.9 - 10.3 mg/dL   Total Protein 5.4 (L) 6.5 - 8.1 g/dL   Albumin 2.7 (L) 3.5 - 5.0 g/dL   AST 28 15 - 41 U/L   ALT 18 17 - 63 U/L   Alkaline Phosphatase 53 38 - 126 U/L   Total Bilirubin <0.1 (L) 0.3 - 1.2 mg/dL   GFR calc non Af Amer 48 (L) >60 mL/min   GFR calc Af Amer 56 (L) >60 mL/min    Comment: (NOTE) The eGFR has been calculated using the CKD EPI equation. This calculation has not been validated in all clinical situations. eGFR's persistently <60 mL/min signify possible Chronic Kidney Disease.    Anion gap 10 5 - 15  CBC WITH DIFFERENTIAL     Status: Abnormal   Collection Time: 08/10/16  2:55 PM  Result Value Ref Range   WBC 11.6  (H) 4.0 - 10.5 K/uL   RBC 2.46 (L) 4.22 - 5.81 MIL/uL   Hemoglobin 6.3 (LL) 13.0 - 17.0 g/dL    Comment: REPEATED TO VERIFY CRITICAL RESULT CALLED TO, READ BACK BY AND VERIFIED WITH: A.REABOID,RN 08/10/16 _0  BY V.WILKINS    HCT 20.0 (L) 39.0 - 52.0 %   MCV 81.3 78.0 - 100.0 fL   MCH 25.6 (L) 26.0 - 34.0 pg   MCHC 31.5 30.0 - 36.0 g/dL   RDW 18.0 (H) 11.5 - 15.5 %   Platelets 295 150 - 400 K/uL   Neutrophils Relative % 74 %   Neutro Abs 8.6 (H) 1.7 - 7.7 K/uL   Lymphocytes Relative 20 %   Lymphs Abs 2.4 0.7 - 4.0 K/uL   Monocytes Relative 4 %   Monocytes Absolute 0.5 0.1 - 1.0 K/uL   Eosinophils Relative 1 %   Eosinophils Absolute 0.1 0.0 - 0.7 K/uL   Basophils Relative 0 %   Basophils Absolute 0.0 0.0 - 0.1 K/uL  Lipase, blood     Status: None   Collection Time: 08/10/16  2:55 PM  Result Value Ref Range   Lipase 19 11 - 51 U/L  APTT     Status: None   Collection Time: 08/10/16  2:55 PM  Result Value Ref Range   aPTT 25 24 - 36 seconds  Protime-INR     Status: None   Collection Time: 08/10/16  2:55 PM  Result Value Ref Range   Prothrombin Time 14.2 11.4 - 15.2 seconds   INR 1.10   I-Stat Chem 8, ED  (not at [facility])     Status: Abnormal   Collection Time: 08/10/16  3:04 PM  Result Value Ref Range   Sodium 134 (L) 135 - 145 mmol/L   Potassium 5.4 (H) 3.5 - 5.1 mmol/L   Chloride 101 101 - 111 mmol/L   BUN 40 (H) 6 - 20 mg/dL   Creatinine, Ser 1.70 (H) 0.61 - 1.24 mg/dL   Glucose, Bld 239 (H) 65 - 99 mg/dL   Calcium, Ion 1.08 (L) 1.15 - 1.40 mmol/L   TCO2 26 0 - 100 mmol/L   Hemoglobin 6.1 (LL) 13.0 - 17.0 g/dL   HCT 18.0 (L) 39.0 - 52.0 %   Comment NOTIFIED PHYSICIAN   I-Stat CG4 Lactic Acid, ED  (not at [facility])     Status:  Abnormal   Collection Time: 08/10/16  3:04 PM  Result Value Ref Range   Lactic Acid, Venous 3.46 (HH) 0.5 - 1.9 mmol/L   Comment NOTIFIED PHYSICIAN   Prepare RBC     Status: None   Collection Time: 08/10/16  3:08 PM  Result Value Ref Range    Order Confirmation ORDER PROCESSED BY BLOOD BANK   CBG monitoring, ED     Status: Abnormal   Collection Time: 08/10/16  3:49 PM  Result Value Ref Range   Glucose-Capillary 140 (H) 65 - 99 mg/dL  Ammonia     Status: Abnormal   Collection Time: 08/10/16  5:00 PM  Result Value Ref Range   Ammonia 37 (H) 9 - 35 umol/L  Comprehensive metabolic panel     Status: Abnormal   Collection Time: 08/10/16  5:00 PM  Result Value Ref Range   Sodium 136 135 - 145 mmol/L   Potassium 5.1 3.5 - 5.1 mmol/L   Chloride 104 101 - 111 mmol/L   CO2 23 22 - 32 mmol/L   Glucose, Bld 130 (H) 65 - 99 mg/dL   BUN 43 (H) 6 - 20 mg/dL   Creatinine, Ser 1.34 (H) 0.61 - 1.24 mg/dL   Calcium 7.2 (L) 8.9 - 10.3 mg/dL   Total Protein 5.4 (L) 6.5 - 8.1 g/dL   Albumin 2.6 (L) 3.5 - 5.0 g/dL   AST 24 15 - 41 U/L   ALT 18 17 - 63 U/L   Alkaline Phosphatase 57 38 - 126 U/L   Total Bilirubin 0.2 (L) 0.3 - 1.2 mg/dL   GFR calc non Af Amer 55 (L) >60 mL/min   GFR calc Af Amer >60 >60 mL/min    Comment: (NOTE) The eGFR has been calculated using the CKD EPI equation. This calculation has not been validated in all clinical situations. eGFR's persistently <60 mL/min signify possible Chronic Kidney Disease.    Anion gap 9 5 - 15  Amylase     Status: None   Collection Time: 08/10/16  5:00 PM  Result Value Ref Range   Amylase 56 28 - 100 U/L  Lipase, blood     Status: None   Collection Time: 08/10/16  5:00 PM  Result Value Ref Range   Lipase 18 11 - 51 U/L  CBC     Status: Abnormal   Collection Time: 08/10/16  5:00 PM  Result Value Ref Range   WBC 9.1 4.0 - 10.5 K/uL   RBC 2.55 (L) 4.22 - 5.81 MIL/uL   Hemoglobin 6.5 (LL) 13.0 - 17.0 g/dL    Comment: CRITICAL VALUE NOTED.  VALUE IS CONSISTENT WITH PREVIOUSLY REPORTED AND CALLED VALUE. REPEATED TO VERIFY    HCT 20.9 (L) 39.0 - 52.0 %   MCV 82.0 78.0 - 100.0 fL   MCH 25.5 (L) 26.0 - 34.0 pg   MCHC 31.1 30.0 - 36.0 g/dL   RDW 17.9 (H) 11.5 - 15.5 %   Platelets  236 150 - 400 K/uL  Protime-INR     Status: None   Collection Time: 08/10/16  5:00 PM  Result Value Ref Range   Prothrombin Time 14.9 11.4 - 15.2 seconds   INR 1.16   APTT     Status: None   Collection Time: 08/10/16  5:00 PM  Result Value Ref Range   aPTT 26 24 - 36 seconds  Triglycerides     Status: None   Collection Time: 08/10/16  5:00 PM  Result Value Ref Range  Triglycerides 103 <150 mg/dL  Lactate dehydrogenase     Status: None   Collection Time: 08/10/16  5:00 PM  Result Value Ref Range   LDH 184 98 - 192 U/L  I-Stat arterial blood gas, ED     Status: Abnormal   Collection Time: 08/10/16  5:20 PM  Result Value Ref Range   pH, Arterial 7.321 (L) 7.350 - 7.450   pCO2 arterial 55.2 (H) 32.0 - 48.0 mmHg   pO2, Arterial 535.0 (H) 83.0 - 108.0 mmHg   Bicarbonate 28.5 (H) 20.0 - 28.0 mmol/L   TCO2 30 0 - 100 mmol/L   O2 Saturation 100.0 %   Acid-Base Excess 2.0 0.0 - 2.0 mmol/L   Patient temperature HIDE    Sample type ARTERIAL   Type and screen     Status: None (Preliminary result)   Collection Time: 08/10/16  5:25 PM  Result Value Ref Range   ABO/RH(D) A POS    Antibody Screen POS    Sample Expiration 08/13/2016    Antibody Identification NO CLINICALLY SIGNIFICANT ANTIBODY IDENTIFIED    DAT, IgG NEG    Unit Number [id number]    Blood Component Type RED CELLS,LR    Unit division 00    Status of Unit ISSUED    Transfusion Status OK TO TRANSFUSE    Crossmatch Result COMPATIBLE    Unit Number [id number]    Blood Component Type RED CELLS,LR    Unit division 00    Status of Unit ALLOCATED    Transfusion Status OK TO TRANSFUSE    Crossmatch Result COMPATIBLE    Unit Number [id number]    Blood Component Type RED CELLS,LR    Unit division 00    Status of Unit ISSUED    Transfusion Status OK TO TRANSFUSE    Crossmatch Result COMPATIBLE   MRSA PCR Screening     Status: None   Collection Time: 08/10/16  7:08 PM  Result Value Ref Range   MRSA by PCR NEGATIVE  NEGATIVE    Comment:        The GeneXpert MRSA Assay (FDA approved for NASAL specimens only), is one component of a comprehensive MRSA colonization surveillance program. It is not intended to diagnose MRSA infection nor to guide or monitor treatment for MRSA infections.   Urinalysis, Routine w reflex microscopic     Status: Abnormal   Collection Time: 08/10/16  7:17 PM  Result Value Ref Range   Color, Urine STRAW (A) YELLOW   APPearance CLEAR CLEAR   Specific Gravity, Urine 1.009 1.005 - 1.030   pH 6.0 5.0 - 8.0   Glucose, UA NEGATIVE NEGATIVE mg/dL   Hgb urine dipstick NEGATIVE NEGATIVE   Bilirubin Urine NEGATIVE NEGATIVE   Ketones, ur NEGATIVE NEGATIVE mg/dL   Protein, ur NEGATIVE NEGATIVE mg/dL   Nitrite NEGATIVE NEGATIVE   Leukocytes, UA NEGATIVE NEGATIVE  CBC     Status: Abnormal   Collection Time: 08/10/16  7:50 PM  Result Value Ref Range   WBC 11.6 (H) 4.0 - 10.5 K/uL   RBC 2.86 (L) 4.22 - 5.81 MIL/uL   Hemoglobin 7.6 (L) 13.0 - 17.0 g/dL   HCT 23.8 (L) 39.0 - 52.0 %   MCV 83.2 78.0 - 100.0 fL   MCH 26.6 26.0 - 34.0 pg   MCHC 31.9 30.0 - 36.0 g/dL   RDW 16.7 (H) 11.5 - 15.5 %   Platelets 269 150 - 400 K/uL  Lactic acid, plasma     Status: None  Collection Time: 08/10/16  8:00 PM  Result Value Ref Range   Lactic Acid, Venous 1.2 0.5 - 1.9 mmol/L  Cortisol     Status: None   Collection Time: 08/10/16  8:00 PM  Result Value Ref Range   Cortisol, Plasma 13.0 ug/dL    Comment: (NOTE) AM    6.7 - 22.6 ug/dL PM   <10.0       ug/dL   Glucose, capillary     Status: Abnormal   Collection Time: 08/10/16  8:19 PM  Result Value Ref Range   Glucose-Capillary 149 (H) 65 - 99 mg/dL   Comment 1 Notify RN   Glucose, capillary     Status: Abnormal   Collection Time: 08/11/16 12:28 AM  Result Value Ref Range   Glucose-Capillary 167 (H) 65 - 99 mg/dL   Comment 1 Notify RN   CBC     Status: Abnormal   Collection Time: 08/11/16  3:50 AM  Result Value Ref Range   WBC  12.7 (H) 4.0 - 10.5 K/uL   RBC 2.85 (L) 4.22 - 5.81 MIL/uL   Hemoglobin 7.5 (L) 13.0 - 17.0 g/dL   HCT 23.7 (L) 39.0 - 52.0 %   MCV 83.2 78.0 - 100.0 fL   MCH 26.3 26.0 - 34.0 pg   MCHC 31.6 30.0 - 36.0 g/dL   RDW 16.9 (H) 11.5 - 15.5 %   Platelets 257 150 - 400 K/uL  Basic metabolic panel     Status: Abnormal   Collection Time: 08/11/16  3:50 AM  Result Value Ref Range   Sodium 136 135 - 145 mmol/L   Potassium 6.5 (HH) 3.5 - 5.1 mmol/L    Comment: DELTA CHECK NOTED NO VISIBLE HEMOLYSIS CRITICAL RESULT CALLED TO, READ BACK BY AND VERIFIED WITH: TURNER D,RN 08/11/16 0509 WAYK    Chloride 104 101 - 111 mmol/L   CO2 26 22 - 32 mmol/L   Glucose, Bld 194 (H) 65 - 99 mg/dL   BUN 37 (H) 6 - 20 mg/dL   Creatinine, Ser 1.43 (H) 0.61 - 1.24 mg/dL   Calcium 7.8 (L) 8.9 - 10.3 mg/dL   GFR calc non Af Amer 51 (L) >60 mL/min   GFR calc Af Amer 59 (L) >60 mL/min    Comment: (NOTE) The eGFR has been calculated using the CKD EPI equation. This calculation has not been validated in all clinical situations. eGFR's persistently <60 mL/min signify possible Chronic Kidney Disease.    Anion gap 6 5 - 15  Glucose, capillary     Status: Abnormal   Collection Time: 08/11/16  4:06 AM  Result Value Ref Range   Glucose-Capillary 189 (H) 65 - 99 mg/dL   Comment 1 Notify RN   Glucose, capillary     Status: None   Collection Time: 08/11/16  8:47 AM  Result Value Ref Range   Glucose-Capillary 99 65 - 99 mg/dL  Basic metabolic panel     Status: Abnormal   Collection Time: 08/11/16  9:16 AM  Result Value Ref Range   Sodium 138 135 - 145 mmol/L   Potassium 5.6 (H) 3.5 - 5.1 mmol/L   Chloride 108 101 - 111 mmol/L   CO2 26 22 - 32 mmol/L   Glucose, Bld 106 (H) 65 - 99 mg/dL   BUN 38 (H) 6 - 20 mg/dL   Creatinine, Ser 1.76 (H) 0.61 - 1.24 mg/dL   Calcium 7.9 (L) 8.9 - 10.3 mg/dL   GFR calc non Af Wyvonnia Lora  40 (L) >60 mL/min   GFR calc Af Amer 46 (L) >60 mL/min    Comment: (NOTE) The eGFR has been  calculated using the CKD EPI equation. This calculation has not been validated in all clinical situations. eGFR's persistently <60 mL/min signify possible Chronic Kidney Disease.    Anion gap 4 (L) 5 - 15  Glucose, capillary     Status: None   Collection Time: 08/11/16 11:34 AM  Result Value Ref Range   Glucose-Capillary 97 65 - 99 mg/dL   Comment 1 Notify RN   Prepare RBC     Status: None   Collection Time: 08/11/16 12:19 PM  Result Value Ref Range   Order Confirmation ORDER PROCESSED BY BLOOD BANK     Ct Head Wo Contrast  Result Date: 08/10/2016 CLINICAL DATA:  Hypertension.  Probable GI bleed. EXAM: CT HEAD WITHOUT CONTRAST TECHNIQUE: Contiguous axial images were obtained from the base of the skull through the vertex without intravenous contrast. COMPARISON:  10/15/2013 FINDINGS: Brain: There is no intracranial hemorrhage, mass or evidence of acute infarction. There is no extra-axial fluid collection. Gray matter and white matter appear normal. Cerebral volume is normal for age. Brainstem and posterior fossa are unremarkable. The CSF spaces appear normal. Vascular: No hyperdense vessel or unexpected calcification. Skull: Normal. Negative for fracture or focal lesion. Sinuses/Orbits: No acute finding. Other: None. IMPRESSION: Normal brain Electronically Signed   By: Andreas Newport M.D.   On: 08/10/2016 21:16   Dg Chest Portable 1 View  Result Date: 08/10/2016 CLINICAL DATA:  62 year old male status post central line placement. EXAM: PORTABLE CHEST 1 VIEW COMPARISON:  Chest x-ray 08/10/2014. FINDINGS: New right IJ central line with tip terminating in the mid superior vena cava. A nasogastric tube is seen extending into the stomach, however, the tip of the nasogastric tube extends below the lower margin of the image. An endotracheal tube is in place with tip 4.8 cm above the carina. Low lung volumes. Ill-defined opacity in the left lung base partially obscuring the left hemidiaphragm,  concerning for airspace consolidation. Right lung appears clear. No pleural effusions. No evidence pulmonary edema. No pneumothorax. Heart size is borderline enlarged. Upper mediastinal contours are within normal limits. IMPRESSION: 1. Support apparatus, as above. 2. Low lung volumes with probable airspace consolidation in the left lower lobe which may reflect pneumonia or sequela of aspiration. Electronically Signed   By: Vinnie Langton M.D.   On: 08/10/2016 16:00   Dg Chest Portable 1 View  Result Date: 08/10/2016 CLINICAL DATA:  Status post intubation today. EXAM: PORTABLE CHEST 1 VIEW COMPARISON:  PA and lateral chest 05/09/2011. FINDINGS: Endotracheal tube is in place with tip in good position at the level of the clavicular heads. NG tube courses into the stomach. Side port is in the distal esophagus. The tube should be advanced 5 cm for better positioning. Mild left basilar atelectasis is seen. Lungs are otherwise clear. No pneumothorax or pleural effusion. IMPRESSION: ETT in good position. NG tube should be advanced approximately 5 cm for better positioning. Subsegmental atelectasis left lung base. Electronically Signed   By: Inge Rise M.D.   On: 08/10/2016 15:28    Review of Systems  Unable to perform ROS: Intubated   Blood pressure 137/62, pulse (!) 111, temperature 99.5 F (37.5 C), resp. rate (!) 26, weight 98.4 kg (216 lb 14.9 oz), SpO2 100 %. Physical Exam  Constitutional: He appears well-developed. No distress.  HENT:  Head: Normocephalic.  Oral endotracheal tube  Neck: No tracheal deviation present.  Cardiovascular: Normal heart sounds and intact distal pulses.   Heart rate 110  Respiratory: No respiratory distress. He has no wheezes. He has no rales.  On the ventilator  GI: Soft. There is no tenderness. There is no rebound and no guarding.  Mild distention  Musculoskeletal: He exhibits no tenderness.  Neurological:  Sedated but does arouse and follow some commands   Skin: Skin is warm.    Assessment/Plan: Duodenal ulcer with hemorrhage - seems to be controlled now after endoscopic clipping and injection. He is undergoing blood product resuscitation and his blood pressure is improved. We will follow closely for evidence of rebleeding as he may need surgical intervention should that occur.  THOMPSON,BURKE E 08/11/2016, 2:41 PM

## 2016-08-11 NOTE — Progress Notes (Signed)
MD explained the procedure, family at the bedside, verbalized understanding, consent signed by daughter.Procedure at the bedside at this time

## 2016-08-11 NOTE — Op Note (Signed)
[facility] Patient Name: Rudene Relijah Crihfield Procedure Date : 08/11/2016 MRN: [id number] Attending MD: Iva Booparl E Columbus Ice , MD Date of Birth: 10/19/1955 CSN: [id number] Age: 61 Admit Type: Inpatient Procedure:                Upper GI endoscopy Indications:              Hematemesis, Hematochezia Providers:                Iva Booparl E. Maebelle Sulton, MD, Waynard EdwardsMegan Oliver RN, RN, Kandice RobinsonsGuillaume                            [place], Technician Referring MD:              Medicines:                General Anesthesia Complications:            There was bleeding during appropriate treatment of                            a visble vessel and the bleeding was stopped. Estimated Blood Loss:     Estimated blood loss: 50 mL requiring treatment                            with placement of hemostatic clip(s). Procedure:                Pre-Anesthesia Assessment:                           - Prior to the procedure, a History and Physical                            was performed, and patient medications and                            allergies were reviewed. The patient's tolerance of                            previous anesthesia was also reviewed. The risks                            and benefits of the procedure and the sedation                            options and risks were discussed with the patient.                            All questions were answered, and informed consent                            was obtained. Prior Anticoagulants: The patient has                            taken no previous anticoagulant or antiplatelet  agents. ASA Grade Assessment: III - A patient with                            severe systemic disease. After reviewing the risks                            and benefits, the patient was deemed in                            satisfactory condition to undergo the procedure.                           After obtaining informed consent, the endoscope was         passed under direct vision. Throughout the                            procedure, the patient's blood pressure, pulse, and                            oxygen saturations were monitored continuously. The                            EG-[id number] ([id number]) scope was introduced through the                            mouth, and advanced to the second part of duodenum.                            The upper GI endoscopy was accomplished without                            difficulty. The patient tolerated the procedure                            well. Scope In: Scope Out: Findings:      One cratered duodenal ulcer with a visible vessel was found in the       anterior duodenal bulb. The lesion was 25 mm in largest dimension. Area       was successfully injected with 7 mL of a 1:10,000 solution of       epinephrine for hemostasis. Estimated blood loss was minimal.       Coagulation for hemostasis using bipolar probe was partially successful       at coagulating the visible vessel but ther was bleeding. Estimated blood       loss: 50 mL requiring treatment with placement of hemostatic clip(s).       For hemostasis, four hemostatic clips were successfully placed (MR       conditional). There was no bleeding at the end of the procedure.      One cratered esophageal ulcer with no bleeding and no stigmata of recent       bleeding was found at the gastroesophageal junction. The lesion was 10       mm in largest dimension.      A single 3 mm nodule was found in the lower third  of the esophagus.       Nipple like lesion ? OG tube trauma      The exam was otherwise without abnormality except for some old blood in       stomach      The cardia and gastric fundus were otherwise normal on retroflexion. Impression:               - One duodenal ulcer with a visible vessel.                            Injected with EPI. Treated with bipolar cautery.                            Clips (MR conditional) were placed.                            - Non-bleeding esophageal ulcer.                           - Nodule found in the esophagus. ? OG trauma                           - The examination was otherwise normal.                           - No specimens collected. Moderate Sedation:      Please see anesthesia notes, moderate sedation not given Recommendation:           - 1) Surgery aware and anticipate that is next step                            if does not hold                           2) Stop octreotide and probably ceftriaxone as no                            liver dz                           3) PPI infusion                           - NPO [Duration].                           - Continue present medications. Procedure Code(s):        --- Professional ---                           [phone], Esophagogastroduodenoscopy, flexible,                            transoral; with control of bleeding, any method Diagnosis Code(s):        --- Professional ---  K26.4, Chronic or unspecified duodenal ulcer with                            hemorrhage                           K22.10, Ulcer of esophagus without bleeding                           K22.8, Other specified diseases of esophagus                           K92.0, Hematemesis                           K92.1, Melena (includes Hematochezia) CPT copyright 2016 [employer]. All rights reserved. The codes documented in this report are preliminary and upon coder review may  be revised to meet current compliance requirements. Iva Boop, MD 08/11/2016 2:15:08 PM This report has been signed electronically. Number of Addenda: 0

## 2016-08-11 NOTE — Progress Notes (Signed)
PULMONARY / CRITICAL CARE MEDICINE   Name: Johnny [name] MRN: [id number] DOB: 04-09-1956      REFERRING MD:  ED MD   CHIEF COMPLAINT:  GIB, resp failure  HISTORY OF PRESENT ILLNESS:  61 yr old with PMHx of bed bugs, drug abuse with cocaine and etoh, was scheduled for colonoscopy in past and declined as high on cocaine. Patient was found down, unknown period of time. He was intubated and noted to have blood per NGT and melena on exam, anemic, hypertensive and hypoglycemic. Patient intubated, and placed on propofol.   SUBJECTIVE:  Overnight, hemoglobin improved from 6.5 to 7.6 after 2 units pRBC. Also hyperkalemic at 6.5 and receiving kayexalate, insulin, glucose, bicarb, and calcium gluconate. Also, hypothermic at 90.1.   VITAL SIGNS: BP (!) 110/58   Pulse 95   Temp 97.7 F (36.5 C)   Resp 19   Wt 216 lb 14.9 oz (98.4 kg)   SpO2 100%   BMI 27.85 kg/m   HEMODYNAMICS:    VENTILATOR SETTINGS: Vent Mode: PRVC FiO2 (%):  [40 %-100 %] 40 % Set Rate:  [14 bmp-18 bmp] 18 bmp Vt Set:  [620 mL] 620 mL PEEP:  [5 cmH20] 5 cmH20 Plateau Pressure:  [19 cmH20-26 cmH20] 26 cmH20  INTAKE / OUTPUT: I/O last 3 completed shifts: In: 3173 [I.V.:2135.4; Blood:717.6; NG/GT:60; IV Piggyback:260] Out: 1870 [Urine:70; Emesis/NG output:1800]  PHYSICAL EXAMINATION:  General:  Unkempt, chronically ill appearing, intubated Neuro:  Unresponsive, rigid, pupils pinpoint and minimally reactive, does not follow commands HEENT: intubated, +JVD Cardiovascular: Tachycardic, regular rhythm, no MRG Lungs:  Coarse breath sounds with rhonchi and inspiratory crackles in lower lung fields bilaterally. No wheezing Abdomen:  Soft, NDNT, hypoactive bowel sounds Musculoskeletal: LEE b/l 2+ Skin:  No noticeable bed bugs  LABS:  BMET  Recent Labs Lab 08/10/16 1455 08/10/16 1504 08/10/16 1700 08/11/16 0350  NA 133* 134* 136 136  K 5.5* 5.4* 5.1 6.5*  CL 102 101 104 104  CO2 21*  --  23 26  BUN 40* 40*  43* 37*  CREATININE 1.50* 1.70* 1.34* 1.43*  GLUCOSE 241* 239* 130* 194*    Electrolytes  Recent Labs Lab 08/10/16 1455 08/10/16 1700 08/11/16 0350  CALCIUM 7.9* 7.2* 7.8*    CBC  Recent Labs Lab 08/10/16 1700 08/10/16 1950 08/11/16 0350  WBC 9.1 11.6* 12.7*  HGB 6.5* 7.6* 7.5*  HCT 20.9* 23.8* 23.7*  PLT 236 269 257    Coag's  Recent Labs Lab 08/10/16 1455 08/10/16 1700  APTT 25 26  INR 1.10 1.16    Sepsis Markers  Recent Labs Lab 08/10/16 1504 08/10/16 2000  LATICACIDVEN 3.46* 1.2    ABG  Recent Labs Lab 08/10/16 1720  PHART 7.321*  PCO2ART 55.2*  PO2ART 535.0*    Liver Enzymes  Recent Labs Lab 08/10/16 1455 08/10/16 1700  AST 28 24  ALT 18 18  ALKPHOS 53 57  BILITOT <0.1* 0.2*  ALBUMIN 2.7* 2.6*    Cardiac Enzymes No results for input(s): TROPONINI, PROBNP in the last 168 hours.  Glucose  Recent Labs Lab 08/10/16 1549 08/10/16 2019 08/11/16 0028 08/11/16 0406  GLUCAP 140* 149* 167* 189*    Imaging Ct Head Wo Contrast  Result Date: 08/10/2016 CLINICAL DATA:  Hypertension.  Probable GI bleed. EXAM: CT HEAD WITHOUT CONTRAST TECHNIQUE: Contiguous axial images were obtained from the base of the skull through the vertex without intravenous contrast. COMPARISON:  10/15/2013 FINDINGS: Brain: There is no intracranial hemorrhage, mass or evidence  of acute infarction. There is no extra-axial fluid collection. Gray matter and white matter appear normal. Cerebral volume is normal for age. Brainstem and posterior fossa are unremarkable. The CSF spaces appear normal. Vascular: No hyperdense vessel or unexpected calcification. Skull: Normal. Negative for fracture or focal lesion. Sinuses/Orbits: No acute finding. Other: None. IMPRESSION: Normal brain Electronically Signed   By: Ellery Plunk M.D.   On: 08/10/2016 21:16   Dg Chest Portable 1 View  Result Date: 08/10/2016 CLINICAL DATA:  61 year old male status post central line placement.  EXAM: PORTABLE CHEST 1 VIEW COMPARISON:  Chest x-ray 08/10/2014. FINDINGS: New right IJ central line with tip terminating in the mid superior vena cava. A nasogastric tube is seen extending into the stomach, however, the tip of the nasogastric tube extends below the lower margin of the image. An endotracheal tube is in place with tip 4.8 cm above the carina. Low lung volumes. Ill-defined opacity in the left lung base partially obscuring the left hemidiaphragm, concerning for airspace consolidation. Right lung appears clear. No pleural effusions. No evidence pulmonary edema. No pneumothorax. Heart size is borderline enlarged. Upper mediastinal contours are within normal limits. IMPRESSION: 1. Support apparatus, as above. 2. Low lung volumes with probable airspace consolidation in the left lower lobe which may reflect pneumonia or sequela of aspiration. Electronically Signed   By: Trudie Reed M.D.   On: 08/10/2016 16:00   Dg Chest Portable 1 View  Result Date: 08/10/2016 CLINICAL DATA:  Status post intubation today. EXAM: PORTABLE CHEST 1 VIEW COMPARISON:  PA and lateral chest 05/09/2011. FINDINGS: Endotracheal tube is in place with tip in good position at the level of the clavicular heads. NG tube courses into the stomach. Side port is in the distal esophagus. The tube should be advanced 5 cm for better positioning. Mild left basilar atelectasis is seen. Lungs are otherwise clear. No pneumothorax or pleural effusion. IMPRESSION: ETT in good position. NG tube should be advanced approximately 5 cm for better positioning. Subsegmental atelectasis left lung base. Electronically Signed   By: Drusilla Kanner M.D.   On: 08/10/2016 15:28   STUDIES:  1/6 ct head>>> Normal Brain 1/6 CXR >>>  Low lung volumes with probable airspace consolidation in the left lower lobe which may reflect pneumonia or sequela of aspiration.  CULTURES: Sputum 1/6>> BCx 1/6>>  ANTIBIOTICS: Ceftriaxone 1/6>>>  SIGNIFICANT  EVENTS: 1/6- GI bleed, change in MS, ett   LINES/TUBES: 1/6 ett>>> 1/6 rt ij cordis>>>  ASSESSMENT / PLAN:  PULMONARY A: Acute Respiratory Failure requiring Intubation Likely Aspiration Pneumonia P:   620/40/18/5- on min support, no A-a gradient Repeat CXR tomorrow am SBt attempt with close observation  CARDIOVASCULAR A:  HTN emergency likely, r/o cocaine intox > now normotensive P:  Prop gtt Hydralazine 10-40 mg Q4H prn Avoiding beta blocker  Off nicardipine  RENAL A:   Hyperkalemia 6.5, s/p bicarb, insulin, glucose, calcium gluconate and kayexalate ARF, ATN, hypovolemia; Cr 1.4 (baseline 1.00), Oliguric out -70 mL yesterday, Net +1,400 L P:   STAT BMET BMET Q8H On D5-1/2 NS at 50 cc/hr Monitor I/Os Adding bicarb drip till k corrected  GASTROINTESTINAL A:   UGIB with Hematemesis and Reported Hematochezia- no evidence of acute abdomen NGT output 1,800 mL of blood out yesterday ? Cirrhosis- liver unremarkable on CT abdomen from 2015 P:   Octreotide gtt PPI NPO GI consult: Plan for EGD today Acute hep panel: pending HIV: pending OGT suction  HEMATOLOGIC A:   Anemia, GIB: 6.1 >  7.5 this morning s/p 2 units pRBCs PT/INR normal Mild Leukocytosis with left shift (12.7) P:  SCDs Trend CBC  INFECTIOUS A:   R/o SBP risk Consider aspiration pneumonia given leukocytosis and hypothermia P:   Empiric ceftriaxone May require broadening if declines  Repeat CXR tomorrow am BCx pending Sputum Cx pending HIV pending  ENDOCRINE A:   Hypoglycemia- resolved, now hyperglycemic in the 140-180s PM cortisol mildly increased at 13 P:   D5-1/2 NS No insulin unless glu greater 220 x 2  NEUROLOGIC A:   Metabolic Encephalopathy CT Head normal no evidence of ICH R/o hypoglycemic injury P:   RASS goal: -2 Prop required Unable wake up May need mri   FAMILY  - Updates: no family - Inter-disciplinary family meet or Palliative Care meeting due by:   1/13  Karlene LinemanAlexa Burns, DO PGY-3 Internal Medicine Resident Pager # [phone] 08/11/2016 9:11 AM   STAFF NOTE: Cindi CarbonI, Daniel Feinstein, MD FACP have personally reviewed patient's available data, including medical history, events of note, physical examination and test results as part of my evaluation. I have discussed with resident/NP and other care providers such as pharmacist, RN and RRT. In addition, I personally evaluated patient and elicited key findings of: not responding, well, rass -3, no sig bleeding overnight, chest clear apical, line clean, ABG last reviewed keep same mV, pcxr in am for asp?, off nicardipine, may need BP support to MAP goal 75-80, may need levophed, maintain octreotide, ppi, for likely EGD< need to correct K , add bicarb drip for K until repeat back, frequent bmet planned, ceftriaxone for asp concerns, glu no treatment until greater 220 x 2, may ned MRI brain . Eeg, concerns for downtime and glu 20 on admission, get cvp, updated nephew The patient is critically ill with multiple organ systems failure and requires high complexity decision making for assessment and support, frequent evaluation and titration of therapies, application of advanced monitoring technologies and extensive interpretation of multiple databases.   Critical Care Time devoted to patient care services described in this note is 35 Minutes. This time reflects time of care of this signee: Rory Percyaniel Feinstein, MD FACP. This critical care time does not reflect procedure time, or teaching time or supervisory time of PA/NP/Med student/Med Resident etc but could involve care discussion time. Rest per NP/medical resident whose note is outlined above and that I agree with   Mcarthur Rossettianiel J. Tyson [name], MD, FACP Pgr: [phone] [facility] 08/11/2016 9:46 AM

## 2016-08-12 ENCOUNTER — Inpatient Hospital Stay (HOSPITAL_COMMUNITY): Payer: Medicare Other

## 2016-08-12 ENCOUNTER — Encounter (HOSPITAL_COMMUNITY): Payer: Self-pay | Admitting: Internal Medicine

## 2016-08-12 DIAGNOSIS — N171 Acute kidney failure with acute cortical necrosis: Secondary | ICD-10-CM

## 2016-08-12 DIAGNOSIS — N179 Acute kidney failure, unspecified: Secondary | ICD-10-CM

## 2016-08-12 DIAGNOSIS — G9340 Encephalopathy, unspecified: Secondary | ICD-10-CM

## 2016-08-12 DIAGNOSIS — K260 Acute duodenal ulcer with hemorrhage: Principal | ICD-10-CM

## 2016-08-12 LAB — HIV ANTIBODY (ROUTINE TESTING W REFLEX): HIV Screen 4th Generation wRfx: NONREACTIVE

## 2016-08-12 LAB — GLUCOSE, CAPILLARY
GLUCOSE-CAPILLARY: 105 mg/dL — AB (ref 65–99)
GLUCOSE-CAPILLARY: 124 mg/dL — AB (ref 65–99)
GLUCOSE-CAPILLARY: 26 mg/dL — AB (ref 65–99)
GLUCOSE-CAPILLARY: 97 mg/dL (ref 65–99)
Glucose-Capillary: 135 mg/dL — ABNORMAL HIGH (ref 65–99)
Glucose-Capillary: 116 mg/dL — ABNORMAL HIGH (ref 65–99)
Glucose-Capillary: 101 mg/dL — ABNORMAL HIGH (ref 65–99)

## 2016-08-12 LAB — BASIC METABOLIC PANEL
ANION GAP: 7 (ref 5–15)
ANION GAP: 8 (ref 5–15)
ANION GAP: 7 (ref 5–15)
BUN: 40 mg/dL — ABNORMAL HIGH (ref 6–20)
BUN: 43 mg/dL — ABNORMAL HIGH (ref 6–20)
BUN: 44 mg/dL — ABNORMAL HIGH (ref 6–20)
CALCIUM: 7.3 mg/dL — AB (ref 8.9–10.3)
CALCIUM: 8.1 mg/dL — AB (ref 8.9–10.3)
CHLORIDE: 104 mmol/L (ref 101–111)
CO2: 26 mmol/L (ref 22–32)
CO2: 28 mmol/L (ref 22–32)
CO2: 29 mmol/L (ref 22–32)
CREATININE: 3.36 mg/dL — AB (ref 0.61–1.24)
CREATININE: 3.8 mg/dL — AB (ref 0.61–1.24)
Calcium: 7.8 mg/dL — ABNORMAL LOW (ref 8.9–10.3)
Chloride: 100 mmol/L — ABNORMAL LOW (ref 101–111)
Chloride: 100 mmol/L — ABNORMAL LOW (ref 101–111)
Creatinine, Ser: 2.76 mg/dL — ABNORMAL HIGH (ref 0.61–1.24)
GFR calc non Af Amer: 23 mL/min — ABNORMAL LOW (ref >60)
GFR calc non Af Amer: 16 mL/min — ABNORMAL LOW (ref >60)
GFR, EST AFRICAN AMERICAN: 27 mL/min — AB (ref >60)
GFR, EST AFRICAN AMERICAN: 21 mL/min — AB (ref >60)
GFR, EST AFRICAN AMERICAN: 18 mL/min — AB (ref >60)
GFR, EST NON AFRICAN AMERICAN: 18 mL/min — AB (ref >60)
Glucose, Bld: 116 mg/dL — ABNORMAL HIGH (ref 65–99)
Glucose, Bld: 117 mg/dL — ABNORMAL HIGH (ref 65–99)
Glucose, Bld: 142 mg/dL — ABNORMAL HIGH (ref 65–99)
Potassium: 4.7 mmol/L (ref 3.5–5.1)
Potassium: 5.2 mmol/L — ABNORMAL HIGH (ref 3.5–5.1)
Potassium: 5.2 mmol/L — ABNORMAL HIGH (ref 3.5–5.1)
SODIUM: 137 mmol/L (ref 135–145)
SODIUM: 136 mmol/L (ref 135–145)
SODIUM: 136 mmol/L (ref 135–145)

## 2016-08-12 LAB — POCT I-STAT 3, ART BLOOD GAS (G3+)
ACID-BASE EXCESS: 4 mmol/L — AB (ref 0.0–2.0)
Bicarbonate: 28.9 mmol/L — ABNORMAL HIGH (ref 20.0–28.0)
O2 Saturation: 69 %
PCO2 ART: 48.1 mmHg — AB (ref 32.0–48.0)
PH ART: 7.39 (ref 7.350–7.450)
TCO2: 30 mmol/L (ref 0–100)
pO2, Arterial: 38 mmHg — CL (ref 83.0–108.0)

## 2016-08-12 LAB — HEPATITIS PANEL, ACUTE
HEP B C IGM: NEGATIVE
Hep A IgM: NEGATIVE
Hepatitis B Surface Ag: NEGATIVE

## 2016-08-12 LAB — CBC
HCT: 23.9 % — ABNORMAL LOW (ref 39.0–52.0)
Hemoglobin: 7.7 g/dL — ABNORMAL LOW (ref 13.0–17.0)
MCH: 27.2 pg (ref 26.0–34.0)
MCHC: 32.2 g/dL (ref 30.0–36.0)
MCV: 84.5 fL (ref 78.0–100.0)
Platelets: 187 10*3/uL (ref 150–400)
RBC: 2.83 MIL/uL — ABNORMAL LOW (ref 4.22–5.81)
RDW: 16.9 % — AB (ref 11.5–15.5)
WBC: 10.9 10*3/uL — AB (ref 4.0–10.5)

## 2016-08-12 LAB — OSMOLALITY, URINE: OSMOLALITY UR: 370 mosm/kg (ref 300–900)

## 2016-08-12 LAB — PREPARE RBC (CROSSMATCH)

## 2016-08-12 LAB — HEMOGLOBIN AND HEMATOCRIT, BLOOD
HCT: 28.3 % — ABNORMAL LOW (ref 39.0–52.0)
Hemoglobin: 9.2 g/dL — ABNORMAL LOW (ref 13.0–17.0)

## 2016-08-12 LAB — CREATININE, URINE, RANDOM: Creatinine, Urine: 90.96 mg/dL

## 2016-08-12 LAB — OSMOLALITY: Osmolality: 302 mOsm/kg — ABNORMAL HIGH (ref 275–295)

## 2016-08-12 LAB — SODIUM, URINE, RANDOM: Sodium, Ur: 43 mmol/L

## 2016-08-12 MED ORDER — NOREPINEPHRINE BITARTRATE 1 MG/ML IV SOLN
0.0000 ug/min | INTRAVENOUS | Status: DC
  Administered 2016-08-12: 16 ug/min via INTRAVENOUS
  Filled 2016-08-12: qty 16

## 2016-08-12 MED ORDER — SODIUM CHLORIDE 0.9 % IV SOLN
8.0000 mg/h | INTRAVENOUS | Status: DC
  Administered 2016-08-12 – 2016-08-16 (×8): 8 mg/h via INTRAVENOUS
  Filled 2016-08-12 (×19): qty 80

## 2016-08-12 MED ORDER — ORAL CARE MOUTH RINSE
15.0000 mL | Freq: Two times a day (BID) | OROMUCOSAL | Status: DC
  Administered 2016-08-12 – 2016-08-20 (×14): 15 mL via OROMUCOSAL

## 2016-08-12 MED ORDER — SODIUM CHLORIDE 0.9 % IV SOLN
Freq: Once | INTRAVENOUS | Status: AC
  Administered 2016-08-12: 08:00:00 via INTRAVENOUS

## 2016-08-12 MED ORDER — PANTOPRAZOLE SODIUM 40 MG PO TBEC: 40.0000 mg | DELAYED_RELEASE_TABLET | Freq: Every day | ORAL | Status: DC

## 2016-08-12 MED ORDER — WHITE PETROLATUM GEL
Status: AC
  Administered 2016-08-12: 20:00:00
  Filled 2016-08-12: qty 1

## 2016-08-12 MED ORDER — PANTOPRAZOLE SODIUM 40 MG IV SOLR
40.0000 mg | Freq: Two times a day (BID) | INTRAVENOUS | Status: DC
  Administered 2016-08-12: 40 mg via INTRAVENOUS
  Filled 2016-08-12: qty 40

## 2016-08-12 NOTE — Progress Notes (Signed)
Daily Rounding Note  08/12/2016, 8:21 AM  LOS: 2 days   SUBJECTIVE:   Chief complaint: hematemasis, hematochezia.  No recurrence of these since yesterday.  No BMs.  Pt tells me he has been taking NSAIDs.    Was oliguric, foley removed and now passing gross blood  Remains on vent.  Epinephrine discontinued.    OBJECTIVE:         Vital signs in last 24 hours:    Temp:  [97.9 F (36.6 C)-100 F (37.8 C)] 99.7 F (37.6 C) (01/08 0800) Pulse Rate:  [65-111] 110 (01/08 0800) Resp:  [0-31] 15 (01/08 0800) BP: (84-161)/(43-126) 161/85 (01/08 0800) SpO2:  [97 %-100 %] 100 % (01/08 0800) FiO2 (%):  [40 %] 40 % (01/08 0800) Weight:  [107.7 kg (237 lb 7 oz)] 107.7 kg (237 lb 7 oz) (01/08 0500) Last BM Date:  (UTA) Filed Weights   08/10/16 1400 08/11/16 0500 08/12/16 0500  Weight: 83 kg (182 lb 15.7 oz) 98.4 kg (216 lb 14.9 oz) 107.7 kg (237 lb 7 oz)   General: alert, able to nod appropriately and follow simple commands   Heart: RRR Chest: clear bil.  No labored breathing on vent.  Abdomen: distended, swollen, NT.  BS hypoactive but no tinkling/tympanitic sounds  GU: gross blood seeping from penile meatus.   Extremities: anasarca/edema into the thighs/hips. Neuro/Psych:  Follows simple commands.  "mittens" in place but not currently agitated  Intake/Output from previous day: 01/07 0701 - 01/08 0700 In: 6340.9 [I.V.:5387.4; Blood:883.5; NG/GT:20; IV Piggyback:50] Out: 200 [Emesis/NG output:200]  Intake/Output this shift: Total I/O In: 220 [I.V.:220] Out: -   Lab Results:  Recent Labs  08/11/16 1438 08/11/16 1550 08/12/16 0400  WBC 12.1* 13.8* 10.9*  HGB 7.1* 8.3* 7.7*  HCT 22.9* 25.9* 23.9*  PLT 243 197 187   BMET  Recent Labs  08/11/16 1700 08/11/16 2239 08/12/16 0400  NA 136 137 136  K 6.3* 4.7 5.2*  CL 105 104 100*  CO2 26 26 28   GLUCOSE 165* 116* 117*  BUN 40* 40* 43*  CREATININE 2.43* 2.76* 3.36*   CALCIUM 8.0* 7.3* 7.8*   LFT  Recent Labs  08/10/16 1455 08/10/16 1700  PROT 5.4* 5.4*  ALBUMIN 2.7* 2.6*  AST 28 24  ALT 18 18  ALKPHOS 53 57  BILITOT <0.1* 0.2*   PT/INR  Recent Labs  08/10/16 1455 08/10/16 1700  LABPROT 14.2 14.9  INR 1.10 1.16   Hepatitis Panel No results for input(s): HEPBSAG, HCVAB, HEPAIGM, HEPBIGM in the last 72 hours.  Studies/Results: Ct Head Wo Contrast  Result Date: 08/10/2016 CLINICAL DATA:  Hypertension.  Probable GI bleed. EXAM: CT HEAD WITHOUT CONTRAST TECHNIQUE: Contiguous axial images were obtained from the base of the skull through the vertex without intravenous contrast. COMPARISON:  10/15/2013 FINDINGS: Brain: There is no intracranial hemorrhage, mass or evidence of acute infarction. There is no extra-axial fluid collection. Gray matter and white matter appear normal. Cerebral volume is normal for age. Brainstem and posterior fossa are unremarkable. The CSF spaces appear normal. Vascular: No hyperdense vessel or unexpected calcification. Skull: Normal. Negative for fracture or focal lesion. Sinuses/Orbits: No acute finding. Other: None. IMPRESSION: Normal brain Electronically Signed   By: Ellery Plunkaniel R Mitchell M.D.   On: 08/10/2016 21:16   Dg Chest Port 1 View  Result Date: 08/12/2016 CLINICAL DATA:  Intubation. EXAM: PORTABLE CHEST 1 VIEW COMPARISON:  08/10/2016. FINDINGS: Interim removal of NG tube. Endotracheal  tube and right IJ line stable position. Heart size stable. Low lung volumes with mild basilar atelectasis. Persistent left base infiltrate. Tiny left pleural effusion. No pneumothorax. IMPRESSION: 1. An removal of NG tube. Endotracheal tube and right IJ line stable position. 2. Low lung volumes. Persistent left base infiltrate suggesting pneumonia. Small left pleural effusion. Electronically Signed   By: Maisie Fus  Register   On: 08/12/2016 06:58   Dg Chest Portable 1 View  Result Date: 08/10/2016 CLINICAL DATA:  61 year old male status  post central line placement. EXAM: PORTABLE CHEST 1 VIEW COMPARISON:  Chest x-ray 08/10/2014. FINDINGS: New right IJ central line with tip terminating in the mid superior vena cava. A nasogastric tube is seen extending into the stomach, however, the tip of the nasogastric tube extends below the lower margin of the image. An endotracheal tube is in place with tip 4.8 cm above the carina. Low lung volumes. Ill-defined opacity in the left lung base partially obscuring the left hemidiaphragm, concerning for airspace consolidation. Right lung appears clear. No pleural effusions. No evidence pulmonary edema. No pneumothorax. Heart size is borderline enlarged. Upper mediastinal contours are within normal limits. IMPRESSION: 1. Support apparatus, as above. 2. Low lung volumes with probable airspace consolidation in the left lower lobe which may reflect pneumonia or sequela of aspiration. Electronically Signed   By: Trudie Reed M.D.   On: 08/10/2016 16:00   Dg Chest Portable 1 View  Result Date: 08/10/2016 CLINICAL DATA:  Status post intubation today. EXAM: PORTABLE CHEST 1 VIEW COMPARISON:  PA and lateral chest 05/09/2011. FINDINGS: Endotracheal tube is in place with tip in good position at the level of the clavicular heads. NG tube courses into the stomach. Side port is in the distal esophagus. The tube should be advanced 5 cm for better positioning. Mild left basilar atelectasis is seen. Lungs are otherwise clear. No pneumothorax or pleural effusion. IMPRESSION: ETT in good position. NG tube should be advanced approximately 5 cm for better positioning. Subsegmental atelectasis left lung base. Electronically Signed   By: Drusilla Kanner M.D.   On: 08/10/2016 15:28   Scheduled Meds: . sodium chloride  10 mL/hr Intravenous Once  . cefTRIAXone (ROCEPHIN)  IV  2 g Intravenous Q24H  . chlorhexidine  15 mL Mouth Rinse BID  . folic acid  1 mg Intravenous Daily  . mouth rinse  15 mL Mouth Rinse q12n4p  . [START ON  08/14/2016] pantoprazole  40 mg Intravenous Q12H  . thiamine injection  100 mg Intravenous Daily   Continuous Infusions: . dextrose 5 % and 0.45% NaCl 50 mL/hr at 08/12/16 0500  . fentaNYL 10 mcg/ml infusion 50 mcg/hr (08/12/16 0808)  . norepinephrine (LEVOPHED) Adult infusion Stopped (08/12/16 0408)  . propofol (DIPRIVAN) infusion 10 mcg/kg/min (08/12/16 0809)  .  sodium bicarbonate  infusion 1000 mL 125 mL/hr at 08/12/16 0500   PRN Meds:.sodium chloride, fentaNYL (SUBLIMAZE) injection, fentaNYL (SUBLIMAZE) injection, hydrALAZINE   ASSESMENT:   *  UGIB with hematemesis, hematochezia. 08/11/16 EGD:  Solitary, non-bleeding DU with VV, bicapped and clipped.  Non-bleediing esophageal ulcer.  Esophageal nodule, ? NGT trauma.  No gastric tube in place per GI advisory.  H Pylori Ab pending. Protonix drip discontinued at 1545 on 1/6, on BID IV Protonix.   No gross oral or rectal blood for at least 18 hours.   *  ABL anemia.  S/p PRBC x 4, 5th currently transfusing.    *  Hypovolemic shock   *  Intubation after found down.  LLL infiltrate on CXR, Rocephin in place.  CT head normal.  EEG completed today.  Tox screen pending, hx cocaine abuse.   *  Oliguric AKI with frank blood post removal of foley catheter.  Hyperkalemia.  Renal service not involved yet.    PLAN   *  Allow clear liquids once he is extubated. Continue (restart as inadvertently discontinued) the Protonix gtt.    *  Given LE edema, any concerns for heart failure?, I do not find past/current echo.      Jennye Moccasin  08/12/2016, 8:21 AM Pager: [phone]

## 2016-08-12 NOTE — Progress Notes (Signed)
PULMONARY / CRITICAL CARE MEDICINE   Name: Johnny [name] MRN: [id number] DOB: [birth year]      REFERRING MD:  ED MD   CHIEF COMPLAINT:  GIB, resp failure  HISTORY OF PRESENT ILLNESS:  61 yr old with PMHx of bed bugs, drug abuse with cocaine and etoh, was scheduled for colonoscopy in past and declined as high on cocaine. Patient was found down, unknown period of time. He was intubated and noted to have blood per NGT and melena on exam, anemic, hypertensive and hypoglycemic. Patient intubated, and placed on propofol.   SUBJECTIVE:  Overnight, patient again noted to be hyperkalemic at 6.3, improved after kayexalate to 5.2. No recorded urine output yesterday. Complains of suprapubic pain.   VITAL SIGNS: BP 134/65   Pulse 94   Temp 99.3 F (37.4 C)   Resp (!) 21   Wt 237 lb 7 oz (107.7 kg)   SpO2 100%   BMI 30.48 kg/m   HEMODYNAMICS: CVP:  [11 mmHg-35 mmHg] 22 mmHg  VENTILATOR SETTINGS: Vent Mode: PRVC FiO2 (%):  [40 %] 40 % Set Rate:  [18 bmp-24 bmp] 24 bmp Vt Set:  [409[620 mL] 620 mL PEEP:  [5 cmH20] 5 cmH20 Plateau Pressure:  [26 cmH20] 26 cmH20  INTAKE / OUTPUT: I/O last 3 completed shifts: In: 8571 [I.V.:7447.5; Blood:883.5; NG/GT:80; IV Piggyback:160] Out: 1570 [Urine:70; Emesis/NG output:1500]  PHYSICAL EXAMINATION:  General:  Unkempt, chronically ill appearing, NAD Neuro: Awake, alert, follows commands HEENT: intubated Cardiovascular: Tachycardic, regular rhythm, no MRG Lungs:  Coarse breath sounds. No wheezing Abdomen:  Soft, NDNT, hypoactive bowel sounds, suprapubic pain Musculoskeletal: LEE b/l 2+ Skin:  No noticeable bed bugs  LABS:  BMET  Recent Labs Lab 08/11/16 1700 08/11/16 2239 08/12/16 0400  NA 136 137 136  K 6.3* 4.7 5.2*  CL 105 104 100*  CO2 26 26 28   BUN 40* 40* 43*  CREATININE 2.43* 2.76* 3.36*  GLUCOSE 165* 116* 117*    Electrolytes  Recent Labs Lab 08/11/16 1700 08/11/16 2239 08/12/16 0400  CALCIUM 8.0* 7.3* 7.8*     CBC  Recent Labs Lab 08/11/16 1438 08/11/16 1550 08/12/16 0400  WBC 12.1* 13.8* 10.9*  HGB 7.1* 8.3* 7.7*  HCT 22.9* 25.9* 23.9*  PLT 243 197 187    Coag's  Recent Labs Lab 08/10/16 1455 08/10/16 1700  APTT 25 26  INR 1.10 1.16    Sepsis Markers  Recent Labs Lab 08/10/16 1504 08/10/16 2000  LATICACIDVEN 3.46* 1.2    ABG  Recent Labs Lab 08/10/16 1720 08/12/16 0514  PHART 7.321* 7.390  PCO2ART 55.2* 48.1*  PO2ART 535.0* 38.0*    Liver Enzymes  Recent Labs Lab 08/10/16 1455 08/10/16 1700  AST 28 24  ALT 18 18  ALKPHOS 53 57  BILITOT <0.1* 0.2*  ALBUMIN 2.7* 2.6*    Cardiac Enzymes No results for input(s): TROPONINI, PROBNP in the last 168 hours.  Glucose  Recent Labs Lab 08/11/16 0847 08/11/16 1134 08/11/16 1534 08/11/16 1946 08/12/16 0000 08/12/16 0403  GLUCAP 99 97 148* 172* 117* 105*    Imaging Dg Chest Port 1 View  Result Date: 08/12/2016 CLINICAL DATA:  Intubation. EXAM: PORTABLE CHEST 1 VIEW COMPARISON:  08/10/2016. FINDINGS: Interim removal of NG tube. Endotracheal tube and right IJ line stable position. Heart size stable. Low lung volumes with mild basilar atelectasis. Persistent left base infiltrate. Tiny left pleural effusion. No pneumothorax. IMPRESSION: 1. An removal of NG tube. Endotracheal tube and right IJ line stable position. 2.  Low lung volumes. Persistent left base infiltrate suggesting pneumonia. Small left pleural effusion. Electronically Signed   By: Maisie Fus  Register   On: 08/12/2016 06:58   STUDIES:  1/6 ct head>>> Normal Brain 1/6 CXR >>>  Low lung volumes with probable airspace consolidation in the left lower lobe which may reflect pneumonia or sequela of aspiration. CXR 1/8 >> An removal of NG tube. Endotracheal tube and right IJ line stable Position. Low lung volumes. Persistent left base infiltrate suggesting pneumonia. Small left pleural effusion.  CULTURES: Sputum 1/6>>pending BCx 1/6>>  NGTD  ANTIBIOTICS: Ceftriaxone 1/6>>>  SIGNIFICANT EVENTS: 1/6- GI bleed, change in MS, ett   LINES/TUBES: 1/6 ett>>> 1/6 rt ij cordis>> 1/6 NGT >> 1/7  ASSESSMENT / PLAN:  PULMONARY A: Acute Respiratory Failure requiring Intubation Likely Aspiration Pneumonia P:   620/40/24/5- on min support, no A-a gradient, Ph 7.39, pCO2 48.1 and pO2 38 this am Repeat CXR tomorrow am SBT attempt with close observation Ceftriaxone 1/6 >>  CXR 1/8 >> An removal of NG tube. Endotracheal tube and right IJ line stable Position. Low lung volumes. Persistent left base infiltrate suggesting pneumonia. Small left pleural effusion.  CARDIOVASCULAR A:  HTN emergency likely, r/o cocaine intox > now normotensive Levophed to keep MAP >75 P:  Prop gtt Avoiding beta blocker  Off nicardipine Levophed gtt CVP 22 this am  RENAL A:   Hyperkalemia 6., s/p kayexalate, improved to 5.2 ARF, ATN, hypovolemia; Cr 1.4 >3.36 (baseline 1.00), Oliguric out 0 mL yesterday P:   BMET Q8H Monitor I/Os NaHCO3 gtt at 125 cc/hr Vent to compensate Check urine sodium, creatinine, osm, serum osm Check renal US   GASTROINTESTINAL A:   UGIB 2/2 Duodenal Ulcer with visible vessel seen on EGD Non-bleeding esophageal ulcer ? Cirrhosis- liver unremarkable on CT abdomen from 2015 P:   PPI NPO GI following Acute hep panel: pending HIV: pending OGT removed 1/7  HEMATOLOGIC A:   Anemia, GIB: 7.7, transfuse another 1 unit pRBC for goal >8 Mild Leukocytosis with left shift (12.7>10.9) P:  SCDs Trend CBC  INFECTIOUS A:   Aspiration Pneumonia > CXR 1/8 >> Persistent left base infiltrate suggesting pneumonia. Small left pleural effusion. P:   Ceftriaxone 1/6 >> May require broadening if declines  BCx NGTD Sputum Cx pending HIV pending  ENDOCRINE A:   Hypoglycemia- resolved, on sodium bicarb in D5W P:   Sodium Bicarb in D5W at 125 cc/hr No insulin unless glu greater 220 x 2  NEUROLOGIC A:    Metabolic Encephalopathy CT Head normal no evidence of ICH R/o hypoglycemic injury P:   RASS goal: -2 Prop required Unable wake up May need MRI  FAMILY  - Updates: no family - Inter-disciplinary family meet or Palliative Care meeting due by:  1/13  Karlene Lineman, DO PGY-3 Internal Medicine Resident Pager # [phone] 08/12/2016 7:41 AM

## 2016-08-12 NOTE — Progress Notes (Signed)
EEG completed; results pending.    

## 2016-08-12 NOTE — Progress Notes (Signed)
1 Day Post-Op  Subjective: Patient is intubated, but awake and responsive Indicates no significant abdominal pain Receiving one u PRBC for Hgb 7.7.  Last Hgb was 8.3 at 1550 on 1/7 No BM since yesterday No OG tube in place per GI instructions  Objective: Vital signs in last 24 hours: Temp:  [97.9 F (36.6 C)-100 F (37.8 C)] 99.7 F (37.6 C) (01/08 0800) Pulse Rate:  [65-111] 110 (01/08 0800) Resp:  [0-31] 15 (01/08 0800) BP: (84-161)/(43-126) 161/85 (01/08 0800) SpO2:  [97 %-100 %] 100 % (01/08 0800) FiO2 (%):  [40 %] 40 % (01/08 0800) Weight:  [107.7 kg (237 lb 7 oz)] 107.7 kg (237 lb 7 oz) (01/08 0500) Last BM Date:  (UTA)  Intake/Output from previous day: 01/07 0701 - 01/08 0700 In: 6340.9 [I.V.:5387.4; Blood:883.5; NG/GT:20; IV Piggyback:50] Out: 200 [Emesis/NG output:200] Intake/Output this shift: Total I/O In: 220 [I.V.:220] Out: -   General appearance: alert, cooperative and intubated GI: soft, non-tender  Lab Results:   Recent Labs  08/11/16 1550 08/12/16 0400  WBC 13.8* 10.9*  HGB 8.3* 7.7*  HCT 25.9* 23.9*  PLT 197 187   BMET  Recent Labs  08/11/16 2239 08/12/16 0400  NA 137 136  K 4.7 5.2*  CL 104 100*  CO2 26 28  GLUCOSE 116* 117*  BUN 40* 43*  CREATININE 2.76* 3.36*  CALCIUM 7.3* 7.8*   PT/INR  Recent Labs  08/10/16 1455 08/10/16 1700  LABPROT 14.2 14.9  INR 1.10 1.16   ABG  Recent Labs  08/10/16 1720 08/12/16 0514  PHART 7.321* 7.390  HCO3 28.5* 28.9*    Studies/Results: Ct Head Wo Contrast  Result Date: 08/10/2016 CLINICAL DATA:  Hypertension.  Probable GI bleed. EXAM: CT HEAD WITHOUT CONTRAST TECHNIQUE: Contiguous axial images were obtained from the base of the skull through the vertex without intravenous contrast. COMPARISON:  10/15/2013 FINDINGS: Brain: There is no intracranial hemorrhage, mass or evidence of acute infarction. There is no extra-axial fluid collection. Gray matter and white matter appear normal.  Cerebral volume is normal for age. Brainstem and posterior fossa are unremarkable. The CSF spaces appear normal. Vascular: No hyperdense vessel or unexpected calcification. Skull: Normal. Negative for fracture or focal lesion. Sinuses/Orbits: No acute finding. Other: None. IMPRESSION: Normal brain Electronically Signed   By: Ellery Plunkaniel R Mitchell M.D.   On: 08/10/2016 21:16   Dg Chest Port 1 View  Result Date: 08/12/2016 CLINICAL DATA:  Intubation. EXAM: PORTABLE CHEST 1 VIEW COMPARISON:  08/10/2016. FINDINGS: Interim removal of NG tube. Endotracheal tube and right IJ line stable position. Heart size stable. Low lung volumes with mild basilar atelectasis. Persistent left base infiltrate. Tiny left pleural effusion. No pneumothorax. IMPRESSION: 1. An removal of NG tube. Endotracheal tube and right IJ line stable position. 2. Low lung volumes. Persistent left base infiltrate suggesting pneumonia. Small left pleural effusion. Electronically Signed   By: Maisie Fushomas  Register   On: 08/12/2016 06:58   Dg Chest Portable 1 View  Result Date: 08/10/2016 CLINICAL DATA:  61 year old male status post central line placement. EXAM: PORTABLE CHEST 1 VIEW COMPARISON:  Chest x-ray 08/10/2014. FINDINGS: New right IJ central line with tip terminating in the mid superior vena cava. A nasogastric tube is seen extending into the stomach, however, the tip of the nasogastric tube extends below the lower margin of the image. An endotracheal tube is in place with tip 4.8 cm above the carina. Low lung volumes. Ill-defined opacity in the left lung base partially obscuring the left  hemidiaphragm, concerning for airspace consolidation. Right lung appears clear. No pleural effusions. No evidence pulmonary edema. No pneumothorax. Heart size is borderline enlarged. Upper mediastinal contours are within normal limits. IMPRESSION: 1. Support apparatus, as above. 2. Low lung volumes with probable airspace consolidation in the left lower lobe which may  reflect pneumonia or sequela of aspiration. Electronically Signed   By: Trudie Reed M.D.   On: 08/10/2016 16:00   Dg Chest Portable 1 View  Result Date: 08/10/2016 CLINICAL DATA:  Status post intubation today. EXAM: PORTABLE CHEST 1 VIEW COMPARISON:  PA and lateral chest 05/09/2011. FINDINGS: Endotracheal tube is in place with tip in good position at the level of the clavicular heads. NG tube courses into the stomach. Side port is in the distal esophagus. The tube should be advanced 5 cm for better positioning. Mild left basilar atelectasis is seen. Lungs are otherwise clear. No pneumothorax or pleural effusion. IMPRESSION: ETT in good position. NG tube should be advanced approximately 5 cm for better positioning. Subsegmental atelectasis left lung base. Electronically Signed   By: Drusilla Kanner M.D.   On: 08/10/2016 15:28    Anti-infectives: Anti-infectives    Start     Dose/Rate Route Frequency Ordered Stop   08/11/16 1600  cefTRIAXone (ROCEPHIN) 1 g in dextrose 5 % 50 mL IVPB  Status:  Discontinued     1 g 100 mL/hr over 30 Minutes Intravenous Every 24 hours 08/10/16 1610 08/11/16 1221   08/11/16 1600  cefTRIAXone (ROCEPHIN) 2 g in dextrose 5 % 50 mL IVPB     2 g 100 mL/hr over 30 Minutes Intravenous Every 24 hours 08/11/16 1221     08/10/16 1500  cefTRIAXone (ROCEPHIN) 1 g in dextrose 5 % 50 mL IVPB     1 g 100 mL/hr over 30 Minutes Intravenous  Once 08/10/16 1449 08/10/16 1615      Assessment/Plan: Duodenal ulcer with hemorrhage  Bleeding seems to be controlled after endoscopic clipping/ injection.  No emergent surgical indications.    LOS: 2 days    Marjoria Mancillas K. 08/12/2016

## 2016-08-12 NOTE — Procedures (Signed)
ELECTROENCEPHALOGRAM REPORT  Date of Study: 08/12/2016  Patient's Name: Johnny [name] MRN: [id number] Date of Birth: 1956-03-26  Referring Provider: Dr. Karlene LinemanAlexa Burns  Clinical History: This is a 61 year old man found down for unknown period of time.  Medications: propofol (DIPRIVAN) 1000 MG/100ML infusion  fentaNYL (SUBLIMAZE) 2,500 mcg in sodium chloride 0.9 % 250 mL (10 mcg/mL) infusion  cefTRIAXone (ROCEPHIN) 2 g in dextrose 5 % 50 mL IVPB  folic acid injection 1 mg  hydrALAZINE (APRESOLINE) injection 10-40 mg  norepinephrine (LEVOPHED) 16 mg in dextrose 5 % 250 mL (0.064 mg/mL) infusion  pantoprazole (PROTONIX) injection 40 mg  sodium bicarbonate 150 mEq in dextrose 5 % 1,000 mL infusion  thiamine (B-1) injection 100 mg   Technical Summary: A multichannel digital EEG recording measured by the international 10-20 system with electrodes applied with paste and impedances below 5000 ohms performed as portable with EKG monitoring in an intubated and sedated patient.  Hyperventilation and photic stimulation were not performed.  The digital EEG was referentially recorded, reformatted, and digitally filtered in a variety of bipolar and referential montages for optimal display.   Description: The patient is intubated and sedated on Propofol and Fentanyl during the recording. There is no clear posterior dominant rhythm. The background consists of a large amount of diffuse 4-6 Hz theta and 2-3 Hz delta slowing.  During drowsiness and sleep, there is an increase in theta and delta slowing of the background. Normal sleep architecture is not seen. Hyperventilation and photic stimulation were not performed.  There were no epileptiform discharges or electrographic seizures seen.    EKG lead showed sinus tachycardia.  Impression: This sedated EEG is abnormal due to moderate diffuse slowing of the background.  Clinical Correlation of the above findings indicates diffuse cerebral dysfunction that  is non-specific in etiology and can be seen with hypoxic/ischemic injury, toxic/metabolic encephalopathies, or medication effect from Propofol and Fentanyl. There were no electrographic seizures in this study. If further clinical questions remain, repeat EEG off sedation may be helpful. Clinical correlation is advised.   Patrcia DollyKaren Aquino, M.D.

## 2016-08-12 NOTE — Progress Notes (Signed)
CSW now following for substance abuse. Pt is a 61 yo man with PMHx significant for cocaine, ETOH abuse, and bedbugs. Patient was found down for unknown period of time prior to arrival.   Patient remains intubated at this time. CSW to provide substance abuse assessment once medically appropriate.    Enos FlingAshley Goddess Gebbia, MSW, LCSW [facility] Clinical Social Worker [phone]

## 2016-08-12 NOTE — Progress Notes (Signed)
RT NOTE:  RT attempted ABG draw x2. Sample drawn and resulted was venous. RT will monitor.

## 2016-08-12 NOTE — Consult Note (Signed)
Urology Consult  CC: Referring physician: Dr. Clide Dales   Reason for referral: Misplaced Foley catheter  Procedure: Using sterile technique I prepped the penis with Betadine.  I initially attempted to pass an 18 [place] coud catheter but met resistance.  I therefore removed this and proceeded with cystoscopy.  The 17 French flexible cystoscope was advanced under direct vision down the urethra.  Some old clots were noted and there appeared to be trauma to the bulbar urethra primarily at the 6 o'clock position.  I was able to pass the scope anterior to this and identify the prosthetic urethra and was able to pass the scope into the bladder without difficulty.  I then passed a 0.038 inch Sensor guidewire through the cystoscope into the bladder and left this in place removing the cystoscope.  An 57 [place] council tip catheter was then passed over the guidewire without difficulty into the bladder with good return of clear urine.  The catheter balloon was filled with 10 cc of sterile water and the catheter was connected to closed system drainage.  The patient tolerated this procedure well with no complication.  Impression/Assessment: Iatrogenic bulbar urethral injury: It appears this catheter balloon was blown up in his bulbar urethra causing injury to the urethra which should heal with Foley catheter drainage alone.  I would recommend that the catheter remain indwelling for at least 1 week and if he is discharged prior to that he will follow up with [place] in the office for catheter removal.   Plan:  1.  Maintain Foley catheter for at least 7-10 days.   2.  If the patient is discharged prior to this time he should return to our office as an outpatient for catheter removal. 3.  Follow-up information has been placed on the chart.    History of Present Illness:  I was contacted regarding Mr. Sahagun and the fact that he had a GI bleed.  He was admitted and placed in the ICU.  A Foley catheter was inserted and  he went 24 hours with no urine output and no one was contacted.  When this was finally discovered the catheter was removed with some blood noted at the meatus and I was contacted regarding placement of a Foley catheter.  He was found to have 1000 cc in his bladder by ultrasound.  His nurse indicated that he had voided a small amount since the catheter was removed.  In speaking with the patient indicates that he has no history of urethral stricture and he is aware of and reports that his urinary stream is strong without symptoms of obstruction such as intermittency or hesitancy.  He's never had a UTI or prostatitis.  He did have a hydrocelectomy performed by Dr. Louis Meckel in the past.  He has not had any form of urethral instrumentation.  Past Medical History:  Diagnosis Date  . Alcoholism ([place])   . Arthritis   . Broken neck ([place])   . Drug abuse    crack  . GERD (gastroesophageal reflux disease)   . H/O tinnitus   . Hydrocele   . Hypertension    no meds  . Loose, teeth    Past Surgical History:  Procedure Laterality Date  . HEMORRHOID SURGERY     as a teenager  . HYDROCELE EXCISION Right 04/15/2014   Procedure: RIGHT HYDROCELECTOMY ADULT;  Surgeon: Ardis Hughs, MD;  Location: [facility];  Service: Urology;  Laterality: Right;  . NECK SURGERY  1996  .  TESTICLE SURGERY      Medications:  Scheduled: . sodium chloride  10 mL/hr Intravenous Once  . cefTRIAXone (ROCEPHIN)  IV  2 g Intravenous Q24H  . chlorhexidine  15 mL Mouth Rinse BID  . folic acid  1 mg Intravenous Daily  . mouth rinse  15 mL Mouth Rinse q12n4p  . thiamine injection  100 mg Intravenous Daily   Continuous: . dextrose 5 % and 0.45% NaCl 50 mL/hr at 08/12/16 0500  . fentaNYL 10 mcg/ml infusion Stopped (08/12/16 1100)  . norepinephrine (LEVOPHED) Adult infusion Stopped (08/12/16 0408)  . pantoprozole (PROTONIX) infusion    . propofol (DIPRIVAN) infusion Stopped (08/12/16 1045)    Allergies:   Allergies  Allergen Reactions  . Ketamine Other (See Comments)    Noted "bad reaction" during intubation 08/10/2016 - MD recommended to not use on this patient ever again.     Family History  Problem Relation Age of Onset  . Colon cancer Paternal Uncle   . Heart disease Mother     father    Social History:  reports that he has been smoking Cigarettes.  He has been smoking about 0.25 packs per day. He has never used smokeless tobacco. He reports that he drinks about 50.4 oz of alcohol per week . He reports that he uses drugs.  Review of Systems (10 point): Pertinent items are noted in HPI. A comprehensive review of systems was negative except as noted above.  Physical Exam:  Vital signs in last 24 hours: Temp:  [99.1 F (37.3 C)-100 F (37.8 C)] 99.9 F (37.7 C) (01/08 1100) Pulse Rate:  [65-119] 118 (01/08 1200) Resp:  [0-26] 16 (01/08 1200) BP: (84-162)/(44-126) 150/108 (01/08 1200) SpO2:  [87 %-100 %] 94 % (01/08 1200) FiO2 (%):  [40 %] 40 % (01/08 0800) Weight:  [237 lb 7 oz (107.7 kg)] 237 lb 7 oz (107.7 kg) (01/08 0500) General appearance: alert and appears stated age Head: Normocephalic, without obvious abnormality, atraumatic Eyes: conjunctivae/corneas clear. EOM's intact.  Oropharynx: moist mucous membranes Neck: supple, symmetrical, trachea midline Resp: normal respiratory effort Cardio: regular rate and rhythm Back: symmetric, no curvature. ROM normal. No CVA tenderness. GI: soft, non-tender; bowel sounds normal; no masses,  no organomegaly Male genitalia: penis: normal male phallus with no lesions or discharge.Testes: bilaterally descended with no masses or tenderness. no hernias Extremities: extremities normal, atraumatic, no cyanosis or edema Skin: Skin color normal. No visible rashes or lesions Neurologic: Grossly normal  Laboratory Data:   Recent Labs  08/11/16 1438 08/11/16 1550 08/12/16 0400 08/12/16 1053  WBC 12.1* 13.8* 10.9*  --   HGB 7.1*  8.3* 7.7* 9.2*  HCT 22.9* 25.9* 23.9* 28.3*   BMET  Recent Labs  08/12/16 0400 08/12/16 1052  NA 136 136  K 5.2* 5.2*  CL 100* 100*  CO2 28 29  GLUCOSE 117* 142*  BUN 43* 44*  CREATININE 3.36* 3.80*  CALCIUM 7.8* 8.1*    Recent Labs  08/10/16 1455 08/10/16 1700  INR 1.10 1.16   No results for input(s): LABURIN in the last 72 hours. Results for orders placed or performed during the hospital encounter of 08/10/16  Blood culture (routine x 2)     Status: None (Preliminary result)   Collection Time: 08/10/16  3:30 PM  Result Value Ref Range Status   Specimen Description BLOOD RIGHT ANTECUBITAL  Final   Special Requests BOTTLES DRAWN AEROBIC AND ANAEROBIC 5 CC  Final   Culture NO GROWTH < 24 HOURS  Final   Report Status PENDING  Incomplete  Culture, blood (Routine X 2) w Reflex to ID Panel     Status: None (Preliminary result)   Collection Time: 08/10/16  5:00 PM  Result Value Ref Range Status   Specimen Description BLOOD RIGHT JUGLAR  Final   Special Requests BOTTLES DRAWN AEROBIC AND ANAEROBIC 5CC  Final   Culture NO GROWTH < 24 HOURS  Final   Report Status PENDING  Incomplete  MRSA PCR Screening     Status: None   Collection Time: 08/10/16  7:08 PM  Result Value Ref Range Status   MRSA by PCR NEGATIVE NEGATIVE Final    Comment:        The GeneXpert MRSA Assay (FDA approved for NASAL specimens only), is one component of a comprehensive MRSA colonization surveillance program. It is not intended to diagnose MRSA infection nor to guide or monitor treatment for MRSA infections.    Creatinine:  Recent Labs  08/10/16 1700 08/11/16 0350 08/11/16 0916 08/11/16 1700 08/11/16 2239 08/12/16 0400 08/12/16 1052  CREATININE 1.34* 1.43* 1.76* 2.43* 2.76* 3.36* 3.80*    Imaging: Ct Head Wo Contrast  Result Date: 08/10/2016 CLINICAL DATA:  Hypertension.  Probable GI bleed. EXAM: CT HEAD WITHOUT CONTRAST TECHNIQUE: Contiguous axial images were obtained from the  base of the skull through the vertex without intravenous contrast. COMPARISON:  10/15/2013 FINDINGS: Brain: There is no intracranial hemorrhage, mass or evidence of acute infarction. There is no extra-axial fluid collection. Gray matter and white matter appear normal. Cerebral volume is normal for age. Brainstem and posterior fossa are unremarkable. The CSF spaces appear normal. Vascular: No hyperdense vessel or unexpected calcification. Skull: Normal. Negative for fracture or focal lesion. Sinuses/Orbits: No acute finding. Other: None. IMPRESSION: Normal brain Electronically Signed   By: Andreas Newport M.D.   On: 08/10/2016 21:16   Dg Chest Port 1 View  Result Date: 08/12/2016 CLINICAL DATA:  Intubation. EXAM: PORTABLE CHEST 1 VIEW COMPARISON:  08/10/2016. FINDINGS: Interim removal of NG tube. Endotracheal tube and right IJ line stable position. Heart size stable. Low lung volumes with mild basilar atelectasis. Persistent left base infiltrate. Tiny left pleural effusion. No pneumothorax. IMPRESSION: 1. An removal of NG tube. Endotracheal tube and right IJ line stable position. 2. Low lung volumes. Persistent left base infiltrate suggesting pneumonia. Small left pleural effusion. Electronically Signed   By: Marcello Moores  Register   On: 08/12/2016 06:58   Dg Chest Portable 1 View  Result Date: 08/10/2016 CLINICAL DATA:  61 year old male status post central line placement. EXAM: PORTABLE CHEST 1 VIEW COMPARISON:  Chest x-ray 08/10/2014. FINDINGS: New right IJ central line with tip terminating in the mid superior vena cava. A nasogastric tube is seen extending into the stomach, however, the tip of the nasogastric tube extends below the lower margin of the image. An endotracheal tube is in place with tip 4.8 cm above the carina. Low lung volumes. Ill-defined opacity in the left lung base partially obscuring the left hemidiaphragm, concerning for airspace consolidation. Right lung appears clear. No pleural effusions.  No evidence pulmonary edema. No pneumothorax. Heart size is borderline enlarged. Upper mediastinal contours are within normal limits. IMPRESSION: 1. Support apparatus, as above. 2. Low lung volumes with probable airspace consolidation in the left lower lobe which may reflect pneumonia or sequela of aspiration. Electronically Signed   By: Vinnie Langton M.D.   On: 08/10/2016 16:00   Dg Chest Portable 1 View  Result Date: 08/10/2016 CLINICAL DATA:  Status post intubation today. EXAM: PORTABLE CHEST 1 VIEW COMPARISON:  PA and lateral chest 05/09/2011. FINDINGS: Endotracheal tube is in place with tip in good position at the level of the clavicular heads. NG tube courses into the stomach. Side port is in the distal esophagus. The tube should be advanced 5 cm for better positioning. Mild left basilar atelectasis is seen. Lungs are otherwise clear. No pneumothorax or pleural effusion. IMPRESSION: ETT in good position. NG tube should be advanced approximately 5 cm for better positioning. Subsegmental atelectasis left lung base. Electronically Signed   By: Inge Rise M.D.   On: 08/10/2016 15:28       Corleen Otwell C 08/12/2016, 12:52 PM

## 2016-08-12 NOTE — Procedures (Signed)
Extubation Procedure Note  Patient Details:   Name: Johnny [name] DOB: 05-Jun-1956 MRN: [id number]   Airway Documentation:  Airway 7.5 mm (Active)  Secured at (cm) 24 cm 08/12/2016  7:47 AM  Measured From Lips 08/12/2016  7:47 AM  Secured Location Left 08/12/2016  7:47 AM  Secured By [employer] Tube Holder 08/12/2016  7:47 AM  Tube Holder Repositioned Yes 08/12/2016  7:47 AM  Cuff Pressure (cm H2O) 26 cm H2O 08/12/2016  4:32 AM  Site Condition Dry 08/12/2016  7:47 AM    Evaluation  O2 sats: stable throughout Complications: No apparent complications Patient did tolerate procedure well. Bilateral Breath Sounds: Clear, Diminished   Yes   Pt. Was extubated to a 4L [place] without any complications, dyspnea or stridor noted. Pt. Was instructed on IS x 6, highest goal achieved was 1250mL.  [name], [name] 08/12/2016, 11:39 PM

## 2016-08-12 NOTE — Progress Notes (Signed)
75cc fentanyl gtt wasted in sink by this RN and witnessed by Robbie LouisAlex S. RN

## 2016-08-13 DIAGNOSIS — I10 Essential (primary) hypertension: Secondary | ICD-10-CM

## 2016-08-13 LAB — TYPE AND SCREEN
BLOOD PRODUCT EXPIRATION DATE: 201801132359
Blood Product Expiration Date: 201801132359
Blood Product Expiration Date: 201801142359
ISSUE DATE / TIME: 201801071222
ISSUE DATE / TIME: 201801071222
ISSUE DATE / TIME: 201801080740
UNIT TYPE AND RH: 6200
UNIT TYPE AND RH: 6200
Unit Type and Rh: 6200

## 2016-08-13 LAB — BLOOD CULTURE ID PANEL (REFLEXED)
Acinetobacter baumannii: NOT DETECTED
CANDIDA GLABRATA: NOT DETECTED
CANDIDA KRUSEI: NOT DETECTED
CANDIDA TROPICALIS: NOT DETECTED
Candida albicans: NOT DETECTED
Candida parapsilosis: NOT DETECTED
ENTEROBACTER CLOACAE COMPLEX: NOT DETECTED
Enterobacteriaceae species: NOT DETECTED
Enterococcus species: NOT DETECTED
Escherichia coli: NOT DETECTED
Haemophilus influenzae: NOT DETECTED
KLEBSIELLA PNEUMONIAE: NOT DETECTED
Klebsiella oxytoca: NOT DETECTED
Listeria monocytogenes: NOT DETECTED
NEISSERIA MENINGITIDIS: NOT DETECTED
PROTEUS SPECIES: NOT DETECTED
PSEUDOMONAS AERUGINOSA: NOT DETECTED
SERRATIA MARCESCENS: NOT DETECTED
STAPHYLOCOCCUS AUREUS BCID: NOT DETECTED
STAPHYLOCOCCUS SPECIES: NOT DETECTED
STREPTOCOCCUS AGALACTIAE: NOT DETECTED
Streptococcus pneumoniae: NOT DETECTED
Streptococcus pyogenes: NOT DETECTED
Streptococcus species: NOT DETECTED

## 2016-08-13 LAB — CBC
HCT: 24.3 % — ABNORMAL LOW (ref 39.0–52.0)
HCT: 23.7 % — ABNORMAL LOW (ref 39.0–52.0)
HCT: 26.2 % — ABNORMAL LOW (ref 39.0–52.0)
HEMOGLOBIN: 7.6 g/dL — AB (ref 13.0–17.0)
Hemoglobin: 7.8 g/dL — ABNORMAL LOW (ref 13.0–17.0)
Hemoglobin: 8.3 g/dL — ABNORMAL LOW (ref 13.0–17.0)
MCH: 27.7 pg (ref 26.0–34.0)
MCH: 27.4 pg (ref 26.0–34.0)
MCH: 27.5 pg (ref 26.0–34.0)
MCHC: 32.1 g/dL (ref 30.0–36.0)
MCHC: 32.1 g/dL (ref 30.0–36.0)
MCHC: 31.7 g/dL (ref 30.0–36.0)
MCV: 86.2 fL (ref 78.0–100.0)
MCV: 85.6 fL (ref 78.0–100.0)
MCV: 86.8 fL (ref 78.0–100.0)
PLATELETS: 162 10*3/uL (ref 150–400)
PLATELETS: 175 10*3/uL (ref 150–400)
PLATELETS: 185 10*3/uL (ref 150–400)
RBC: 2.82 MIL/uL — AB (ref 4.22–5.81)
RBC: 2.77 MIL/uL — AB (ref 4.22–5.81)
RBC: 3.02 MIL/uL — AB (ref 4.22–5.81)
RDW: 16.7 % — ABNORMAL HIGH (ref 11.5–15.5)
RDW: 16.4 % — ABNORMAL HIGH (ref 11.5–15.5)
RDW: 17 % — AB (ref 11.5–15.5)
WBC: 9.7 10*3/uL (ref 4.0–10.5)
WBC: 10.3 10*3/uL (ref 4.0–10.5)
WBC: 10.9 10*3/uL — AB (ref 4.0–10.5)

## 2016-08-13 LAB — BASIC METABOLIC PANEL
Anion gap: 7 (ref 5–15)
BUN: 31 mg/dL — AB (ref 6–20)
CO2: 30 mmol/L (ref 22–32)
CREATININE: 2.15 mg/dL — AB (ref 0.61–1.24)
Calcium: 8.3 mg/dL — ABNORMAL LOW (ref 8.9–10.3)
Chloride: 105 mmol/L (ref 101–111)
GFR calc Af Amer: 36 mL/min — ABNORMAL LOW (ref >60)
GFR, EST NON AFRICAN AMERICAN: 31 mL/min — AB (ref >60)
GLUCOSE: 92 mg/dL (ref 65–99)
POTASSIUM: 4.9 mmol/L (ref 3.5–5.1)
Sodium: 142 mmol/L (ref 135–145)

## 2016-08-13 LAB — GLUCOSE, CAPILLARY
GLUCOSE-CAPILLARY: 102 mg/dL — AB (ref 65–99)
GLUCOSE-CAPILLARY: 94 mg/dL (ref 65–99)
GLUCOSE-CAPILLARY: 120 mg/dL — AB (ref 65–99)
GLUCOSE-CAPILLARY: 103 mg/dL — AB (ref 65–99)
GLUCOSE-CAPILLARY: 152 mg/dL — AB (ref 65–99)

## 2016-08-13 LAB — TRIGLYCERIDES: TRIGLYCERIDES: 101 mg/dL (ref <150)

## 2016-08-13 MED ORDER — ACETAMINOPHEN 325 MG PO TABS
650.0000 mg | ORAL_TABLET | Freq: Four times a day (QID) | ORAL | Status: DC | PRN
Start: 2016-08-13 — End: 2016-08-13

## 2016-08-13 MED ORDER — AMOXICILLIN-POT CLAVULANATE 875-125 MG PO TABS
1.0000 | ORAL_TABLET | Freq: Two times a day (BID) | ORAL | Status: DC
  Administered 2016-08-13 (×2): 1 via ORAL
  Filled 2016-08-13 (×3): qty 1

## 2016-08-13 MED ORDER — PANTOPRAZOLE SODIUM 40 MG IV SOLR: 40.0000 mg | Freq: Two times a day (BID) | INTRAVENOUS | Status: DC

## 2016-08-13 MED ORDER — PANTOPRAZOLE SODIUM 40 MG PO TBEC: 40.0000 mg | DELAYED_RELEASE_TABLET | Freq: Two times a day (BID) | ORAL | Status: DC

## 2016-08-13 MED ORDER — FOLIC ACID 1 MG PO TABS
1.0000 mg | ORAL_TABLET | Freq: Every day | ORAL | Status: DC
  Administered 2016-08-13 – 2016-08-21 (×8): 1 mg via ORAL
  Filled 2016-08-13 (×9): qty 1

## 2016-08-13 MED ORDER — VITAMIN B-1 100 MG PO TABS
100.0000 mg | ORAL_TABLET | Freq: Every day | ORAL | Status: DC
  Administered 2016-08-13 – 2016-08-21 (×8): 100 mg via ORAL
  Filled 2016-08-13 (×9): qty 1

## 2016-08-13 MED ORDER — MORPHINE SULFATE (PF) 2 MG/ML IV SOLN
1.0000 mg | Freq: Once | INTRAVENOUS | Status: AC
  Administered 2016-08-13: 1 mg via INTRAVENOUS
  Filled 2016-08-13: qty 1

## 2016-08-13 NOTE — Progress Notes (Signed)
Patient ID: Johnny [name], male   DOB: 1956-04-22, 61 y.o.   MRN: [id number]  Patient seems to be improving.  No sign of recurrent bleed.  Will sign off.  Call us if needed.    Wilmon ArmsMatthew K. Corliss Skainssuei, MD, [facility]  General/ Trauma Surgery  08/13/2016 9:01 AM

## 2016-08-13 NOTE — Progress Notes (Signed)
Daily Rounding Note  08/13/2016, 9:07 AM  LOS: 3 days   SUBJECTIVE:   Chief complaint:  Doesn't want anything stuck down his throat  No bloody stools.  Denies nausea.  Admits to using ~ 600 -800 mg Ibuprofen up to 2 x per day for pain in his legs.  No longer passing blood per urethra.   Extubated.  On clears though some  ? Of dysphagia.    OBJECTIVE:         Vital signs in last 24 hours:    Temp:  [98.6 F (37 C)-99.9 F (37.7 C)] 99 F (37.2 C) (01/09 0811) Pulse Rate:  [94-119] 101 (01/09 0800) Resp:  [10-21] 15 (01/09 0800) BP: (120-168)/(55-108) 150/82 (01/09 0800) SpO2:  [77 %-100 %] 96 % (01/09 0800) Weight:  [102.7 kg (226 lb 6.6 oz)] 102.7 kg (226 lb 6.6 oz) (01/09 0600) Last BM Date:  (UTA) Filed Weights   08/11/16 0500 08/12/16 0500 08/13/16 0600  Weight: 98.4 kg (216 lb 14.9 oz) 107.7 kg (237 lb 7 oz) 102.7 kg (226 lb 6.6 oz)   General: looks a bit swollen but no acutely ill.     Heart: RRR, into low 1teens. Chest: clear bil.  Some cough.  No dyspnea Abdomen: soft, NT, ND.  No mass or HSM  Extremities: + LE edema/anasarca: persists but improved Neuro/Psych:  Oriented to place, self.  Answers slowly but accurately with slightly slurred speech.  Intake/Output from previous day: 01/08 0701 - 01/09 0700 In: 2660.6 [I.V.:2196.4; Blood:335; IV Piggyback:50] Out: 5600 [Urine:5600]  Intake/Output this shift: Total I/O In: 35 [I.V.:35] Out: 350 [Urine:350]  Lab Results:  Recent Labs  08/11/16 1550 08/12/16 0400 08/12/16 1053 08/13/16 0414  WBC 13.8* 10.9*  --  9.7  HGB 8.3* 7.7* 9.2* 7.8*  HCT 25.9* 23.9* 28.3* 24.3*  PLT 197 187  --  162   BMET  Recent Labs  08/11/16 2239 08/12/16 0400 08/12/16 1052  NA 137 136 136  K 4.7 5.2* 5.2*  CL 104 100* 100*  CO2 26 28 29   GLUCOSE 116* 117* 142*  BUN 40* 43* 44*  CREATININE 2.76* 3.36* 3.80*  CALCIUM 7.3* 7.8* 8.1*   LFT  Recent Labs  08/10/16 1455 08/10/16 1700  PROT 5.4* 5.4*  ALBUMIN 2.7* 2.6*  AST 28 24  ALT 18 18  ALKPHOS 53 57  BILITOT <0.1* 0.2*   PT/INR  Recent Labs  08/10/16 1455 08/10/16 1700  LABPROT 14.2 14.9  INR 1.10 1.16   Hepatitis Panel  Recent Labs  08/10/16 1602  HEPBSAG Negative  HCVAB <0.1  HEPAIGM Negative  HEPBIGM Negative    Studies/Results: Dg Chest Port 1 View  Result Date: 08/12/2016 CLINICAL DATA:  Intubation. EXAM: PORTABLE CHEST 1 VIEW COMPARISON:  08/10/2016. FINDINGS: Interim removal of NG tube. Endotracheal tube and right IJ line stable position. Heart size stable. Low lung volumes with mild basilar atelectasis. Persistent left base infiltrate. Tiny left pleural effusion. No pneumothorax. IMPRESSION: 1. An removal of NG tube. Endotracheal tube and right IJ line stable position. 2. Low lung volumes. Persistent left base infiltrate suggesting pneumonia. Small left pleural effusion. Electronically Signed   By: Maisie Fus  Register   On: 08/12/2016 06:58    ASSESMENT:   *  UGIB with hematemesis, hematochezia. 08/11/16 EGD:  Solitary, non-bleeding DU with VV, bicapped and clipped.  Non-bleediing esophageal ulcer. Esophageal nodule, ? NGT trauma.  No gastric tube in place per GI advisory.  H Pylori Ab still pending but suspect NSAID induced ulcers. Protonix drip through 1/10 1330.   No gross oral or rectal blood for >24.   *  ABL anemia.  S/p PRBC x 5.    *  Hypovolemic shock   *  Intubation after found down.  LLL infiltrate on CXR, Rocephin in place.  CT head normal.  EEG completed today.  Tox screen pending, hx cocaine abuse.   *  Foley induced urethral injury per cystoscopy.     *  AKI.  Hyperkalemia. Improved post resolution of urethral obstruction.    PLAN   *  Protonix GTT through tomorrow, then po BID.  *  Awaiting H Pylori serology, if  + will need abx.  Discussed discontinuation of NSAIDs with Pt.    *  Advance to solid foods as tolerated.  May need BSS  eval with SLP.    *  Threshold for repeat transfusion is Hgb <7.    *  Does he need 2D echo given peripheral edema, borderline CM?     Jennye MoccasinSarah Rayaan Garguilo  08/13/2016, 9:07 AM Pager: [phone]

## 2016-08-13 NOTE — Care Management Note (Signed)
Case Management Note  Patient Details  Name: Chancy Hurterlijah J Prohaska MRN: [id number] Date of Birth: Dec 20, 1955  Subjective/Objective:    Pt admitted with hematochezia and bleeding ulcer                Action/Plan:   Per pt ; PTA he was independent at home with mom and brother - however per PT pt has functional limitations and requires max assist.  SNF has been recommended and CSW has been consulted   Expected Discharge Date:                  Expected Discharge Plan:  Skilled Nursing Facility  In-House Referral:  Clinical Social Work  Discharge planning Services  CM Consult  Post Acute Care Choice:    Choice offered to:     DME Arranged:    DME Agency:     HH Arranged:    HH Agency:     Status of Service:  In process, will continue to follow  If discussed at Long Length of Stay Meetings, dates discussed:    Additional Comments:  Cherylann ParrClaxton, Cheyne Boulden S, RN 08/13/2016, 3:58 PM

## 2016-08-13 NOTE — Progress Notes (Signed)
PULMONARY / CRITICAL CARE MEDICINE   Name: Johnny [name] MRN: [id number] DOB: [birth year]      REFERRING MD:  ED MD   CHIEF COMPLAINT:  GIB, resp failure  HISTORY OF PRESENT ILLNESS:  61 yr old with PMHx of drug abuse with cocaine and etoh presented on 1/6 was found down with hematochezia and found to have an UGIB 2/2 bleeding duodenal ulcer.   SUBJECTIVE:  No acute events overnight. Extubated successfully on 1/8. Patient was found to have sustained an iatrogenic urethral injury 2/2 foley placement yesterday. Urology evaluated the patient and placed a new foley to remain in place for 7-10 days. He denies any complaints.   VITAL SIGNS: BP 137/69   Pulse (!) 103   Temp 99.3 F (37.4 C) (Oral)   Resp 16   Wt 226 lb 6.6 oz (102.7 kg)   SpO2 97%   BMI 29.07 kg/m   HEMODYNAMICS: CVP:  [11 mmHg-17 mmHg] 11 mmHg  VENTILATOR SETTINGS:  INTAKE / OUTPUT: I/O last 3 completed shifts: In: 5973.1 [I.V.:5508.9; Blood:335; Other:79.2; IV Piggyback:50] Out: 5600 [Urine:5600]  PHYSICAL EXAMINATION:  General: Pleasant, NAD Neuro: Awake, alert, follows commands, oriented Cardiovascular: Tachycardic, regular rhythm, no MRG Lungs:  Coarse breath sounds. No wheezing Abdomen:  Soft, NDNT, normoactive bowel sounds  GU: Foley in place Extremities: LEE b/l 2+  LABS:  BMET  Recent Labs Lab 08/11/16 2239 08/12/16 0400 08/12/16 1052  NA 137 136 136  K 4.7 5.2* 5.2*  CL 104 100* 100*  CO2 26 28 29   BUN 40* 43* 44*  CREATININE 2.76* 3.36* 3.80*  GLUCOSE 116* 117* 142*    Electrolytes  Recent Labs Lab 08/11/16 2239 08/12/16 0400 08/12/16 1052  CALCIUM 7.3* 7.8* 8.1*    CBC  Recent Labs Lab 08/11/16 1550 08/12/16 0400 08/12/16 1053 08/13/16 0414  WBC 13.8* 10.9*  --  9.7  HGB 8.3* 7.7* 9.2* 7.8*  HCT 25.9* 23.9* 28.3* 24.3*  PLT 197 187  --  162    Coag's  Recent Labs Lab 08/10/16 1455 08/10/16 1700  APTT 25 26  INR 1.10 1.16    Sepsis Markers  Recent  Labs Lab 08/10/16 1504 08/10/16 2000  LATICACIDVEN 3.46* 1.2    ABG  Recent Labs Lab 08/10/16 1720 08/12/16 0514  PHART 7.321* 7.390  PCO2ART 55.2* 48.1*  PO2ART 535.0* 38.0*    Liver Enzymes  Recent Labs Lab 08/10/16 1455 08/10/16 1700  AST 28 24  ALT 18 18  ALKPHOS 53 57  BILITOT <0.1* 0.2*  ALBUMIN 2.7* 2.6*    Cardiac Enzymes No results for input(s): TROPONINI, PROBNP in the last 168 hours.  Glucose  Recent Labs Lab 08/12/16 0756 08/12/16 1146 08/12/16 1541 08/12/16 2001 08/12/16 2345 08/13/16 0348  GLUCAP 135* 124* 116* 101* 97 102*    Imaging No results found. STUDIES:  1/6 CT head>>> Normal Brain 1/6 CXR >>>  Low lung volumes with probable airspace consolidation in the left lower lobe which may reflect pneumonia or sequela of aspiration. CXR 1/8 >> An removal of NG tube. Endotracheal tube and right IJ line stable Position. Low lung volumes. Persistent left base infiltrate suggesting pneumonia. Small left pleural effusion.  CULTURES: Sputum 1/6>> pending, not collected BCx 1/6>> NGTD  ANTIBIOTICS: Ceftriaxone 1/6>>>   SIGNIFICANT EVENTS: 1/6- GI bleed, change in MS, ett 1/7 > EGD revealed duodenal ulcer 1/8 > ETT removed, new foley placed 2/2 urethral injury  LINES/TUBES: 1/6 ett>>>1/8 1/6 rt ij cordis>> 1/6 NGT >> 1/7  ASSESSMENT / PLAN:  PULMONARY A: Acute Respiratory Failure > now extubated Aspiration Pneumonia P:   Ceftriaxone 1/6 >> 1/9 Start Augmentin based on renal function Consider transfer to telemetry and to [facility] today  CARDIOVASCULAR A:  HTN Emergency > resolved, normotensive, but BPs creeping up P:  CVP 11 this am May need addition of antihypertensive at some point  RENAL A:   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 Cirrhosis- liver unremarkable on CT abdomen from 2015 P:   PPI gtt Clear Liquid Diet- May need speech evaluation GI following Acute hep panel: negative HIV: negative H. Pylori pending  HEMATOLOGIC A:   Anemia, GIB: 7.8 this morning Mild Leukocytosis > resolved P:  SCDs Trend CBC  INFECTIOUS A:   Aspiration Pneumonia > leukocytosis resolved, hypothermia resolved, afebrile for 48 hours P:   Ceftriaxone 1/6 >> Consider transitioning to Augmentin BCx NGTD Sputum Cx pending HIV negative  ENDOCRINE A:   Hypoglycemia > resolved P:   Monitor on BMET  NEUROLOGIC A:   No Acute Issues P:    Monitor  FAMILY  - Updates: no family - Inter-disciplinary family meet or Palliative Care meeting due by:  1/13  Karlene Lineman, DO PGY-3 Internal Medicine Resident Pager # [phone] 08/13/2016 7:29 AM

## 2016-08-13 NOTE — Progress Notes (Signed)
PHARMACY - PHYSICIAN COMMUNICATION CRITICAL VALUE ALERT - BLOOD CULTURE IDENTIFICATION (BCID)  Results for orders placed or performed during the hospital encounter of 08/10/16  Blood Culture ID Panel (Reflexed) (Collected: 08/10/2016  3:30 PM)  Result Value Ref Range   Enterococcus species NOT DETECTED NOT DETECTED   Listeria monocytogenes NOT DETECTED NOT DETECTED   Staphylococcus species NOT DETECTED NOT DETECTED   Staphylococcus aureus NOT DETECTED NOT DETECTED   Streptococcus species NOT DETECTED NOT DETECTED   Streptococcus agalactiae NOT DETECTED NOT DETECTED   Streptococcus pneumoniae NOT DETECTED NOT DETECTED   Streptococcus pyogenes NOT DETECTED NOT DETECTED   Acinetobacter baumannii NOT DETECTED NOT DETECTED   Enterobacteriaceae species NOT DETECTED NOT DETECTED   Enterobacter cloacae complex NOT DETECTED NOT DETECTED   Escherichia coli NOT DETECTED NOT DETECTED   Klebsiella oxytoca NOT DETECTED NOT DETECTED   Klebsiella pneumoniae NOT DETECTED NOT DETECTED   Proteus species NOT DETECTED NOT DETECTED   Serratia marcescens NOT DETECTED NOT DETECTED   Haemophilus influenzae NOT DETECTED NOT DETECTED   Neisseria meningitidis NOT DETECTED NOT DETECTED   Pseudomonas aeruginosa NOT DETECTED NOT DETECTED   Candida albicans NOT DETECTED NOT DETECTED   Candida glabrata NOT DETECTED NOT DETECTED   Candida krusei NOT DETECTED NOT DETECTED   Candida parapsilosis NOT DETECTED NOT DETECTED   Candida tropicalis NOT DETECTED NOT DETECTED    Name of physician (or Provider) Contacted: Dr Kendrick FriesMcQuaid  Changes to prescribed antibiotics required: None  Isaac BlissMichael Eldrick Penick, PharmD, BCPS, BCCCP Clinical Pharmacist Clinical phone for 08/13/2016 from 7a-3:30p: [phone] If after 3:30p, please call main pharmacy at: [phone] 08/13/2016 10:28 AM

## 2016-08-13 NOTE — Evaluation (Signed)
Physical Therapy Evaluation Patient Details Name: Johnny [name] MRN: [id number] DOB: Oct 24, 1955 Today's Date: 08/13/2016   History of Present Illness  61 yr old with PMHx of drug abuse with cocaine and etoh presented on 1/6 was found down with hematochezia and found to have an UGIB 2/2 bleeding duodenal ulcer. . H/o broken neck 1996.  Clinical Impression  Pt admitted with above diagnosis. Pt currently with functional limitations due to the deficits listed below (see PT Problem List). Pt poor historian unsure of PLOF or where/who he was living with.Pt currently requiring maxAx2 for mobility and is unable to stand.  Pt will benefit from skilled PT to increase their independence and safety with mobility to allow discharge to the venue listed below.       Follow Up Recommendations SNF 24/7 assist    Equipment Recommendations   (TBD)    Recommendations for Other Services       Precautions / Restrictions Precautions Precautions: Fall Restrictions Weight Bearing Restrictions: No      Mobility  Bed Mobility Overal bed mobility: Needs Assistance Bed Mobility: Rolling;Supine to Sit;Sit to Supine Rolling: Max assist;+2 for physical assistance   Supine to sit: Max assist;+2 for physical assistance Sit to supine: Max assist;+2 for physical assistance   General bed mobility comments: pt required assist for LE management and trunk elevation, limited use of L UE and LE  Transfers Overall transfer level: Needs assistance Equipment used: Rolling walker (2 wheeled) Transfers: Sit to/from Stand Sit to Stand: Max assist;+2 physical assistance         General transfer comment: attempted to stand x 2, unable to achieve full stand  Ambulation/Gait                Stairs            Wheelchair Mobility    Modified Rankin (Stroke Patients Only)       Balance Overall balance assessment: Needs assistance Sitting-balance support: Feet supported;Bilateral upper extremity  supported Sitting balance-Leahy Scale: Poor Sitting balance - Comments: pt sat EOB x 8 min, required min/modA to maintain balance. Postural control: Posterior lean                                   Pertinent Vitals/Pain Pain Assessment: No/denies pain    Home Living Family/patient expects to be discharged to:: Skilled nursing facility                 Additional Comments: pt poor historian, pt told RN he lives alone, pt told PT he lives with mother and brother    Prior Function Level of Independence: Independent with assistive device(s)         Comments: pt reports amb with "4 wheeler"     Hand Dominance   Dominant Hand: Right    Extremity/Trunk Assessment   Upper Extremity Assessment Upper Extremity Assessment: RUE deficits/detail;LUE deficits/detail RUE Deficits / Details: grossly 3/5 LUE Deficits / Details: grossly 2/5 LUE Coordination: decreased fine motor;decreased gross motor    Lower Extremity Assessment Lower Extremity Assessment: RLE deficits/detail;LLE deficits/detail RLE Deficits / Details: grossly 3-/5 and with edema LLE Deficits / Details: increased tone, grossly 2-/5 LLE Coordination: decreased gross motor       Communication   Communication: No difficulties  Cognition Arousal/Alertness: Awake/alert Behavior During Therapy: Flat affect Overall Cognitive Status: Impaired/Different from baseline Area of Impairment: Orientation;Following commands;Safety/judgement;Awareness;Problem solving Orientation Level: Disoriented to;Time;Situation  Following Commands: Follows one step commands with increased time;Follows one step commands inconsistently Safety/Judgement: Decreased awareness of safety;Decreased awareness of deficits Awareness: Intellectual Problem Solving: Slow processing;Decreased initiation;Difficulty sequencing;Requires verbal cues;Requires tactile cues      General Comments General comments (skin integrity, edema,  etc.): noted edema x 4 extremities    Exercises     Assessment/Plan    PT Assessment Patient needs continued PT services  PT Problem List Decreased strength;Decreased range of motion;Decreased activity tolerance;Decreased balance;Decreased mobility;Decreased coordination;Decreased safety awareness;Decreased cognition          PT Treatment Interventions DME instruction;Gait training;Stair training;Functional mobility training;Therapeutic activities;Therapeutic exercise;Balance training;Neuromuscular re-education    PT Goals (Current goals can be found in the Care Plan section)  Acute Rehab PT Goals Patient Stated Goal: didn't state PT Goal Formulation: With patient Time For Goal Achievement: 08/27/16 Potential to Achieve Goals: Good    Frequency Min 3X/week   Barriers to discharge Decreased caregiver support unsure of support system    Co-evaluation               End of Session Equipment Utilized During Treatment: Gait belt;Oxygen Activity Tolerance: Patient tolerated treatment well Patient left: in bed;with call bell/phone within reach;with bed alarm set Nurse Communication: Mobility status         Time: 1350-1417 PT Time Calculation (min) (ACUTE ONLY): 27 min   Charges:   PT Evaluation $PT Eval High Complexity: 1 Procedure PT Treatments $Therapeutic Activity: 8-22 mins   PT G Codes:        Sinia Antosh M Breslyn Abdo 08/13/2016, 2:41 PM  Lewis Shock, PT, DPT Pager #: [phone] Office #: [phone]

## 2016-08-14 ENCOUNTER — Inpatient Hospital Stay (HOSPITAL_COMMUNITY): Payer: Medicare Other

## 2016-08-14 ENCOUNTER — Encounter (HOSPITAL_COMMUNITY): Payer: Self-pay | Admitting: Interventional Radiology

## 2016-08-14 ENCOUNTER — Encounter (HOSPITAL_COMMUNITY)
Admission: EM | Disposition: A | Discharge status: Skilled Nursing Facility | Payer: Self-pay | Source: Home / Self Care | Attending: Internal Medicine

## 2016-08-14 DIAGNOSIS — J9601 Acute respiratory failure with hypoxia: Secondary | ICD-10-CM

## 2016-08-14 HISTORY — PX: IR GENERIC HISTORICAL: IMG1180011

## 2016-08-14 LAB — BASIC METABOLIC PANEL
Anion gap: 6 (ref 5–15)
BUN: 20 mg/dL (ref 6–20)
CALCIUM: 8.3 mg/dL — AB (ref 8.9–10.3)
CO2: 28 mmol/L (ref 22–32)
Chloride: 107 mmol/L (ref 101–111)
Creatinine, Ser: 1.41 mg/dL — ABNORMAL HIGH (ref 0.61–1.24)
GFR calc Af Amer: >60 mL/min (ref >60)
GFR calc non Af Amer: 52 mL/min — ABNORMAL LOW (ref >60)
GLUCOSE: 109 mg/dL — AB (ref 65–99)
Potassium: 4.6 mmol/L (ref 3.5–5.1)
Sodium: 141 mmol/L (ref 135–145)

## 2016-08-14 LAB — CBC
HCT: 21.8 % — ABNORMAL LOW (ref 39.0–52.0)
HCT: 21.8 % — ABNORMAL LOW (ref 39.0–52.0)
HCT: 23.8 % — ABNORMAL LOW (ref 39.0–52.0)
HEMATOCRIT: 22 % — AB (ref 39.0–52.0)
Hemoglobin: 7 g/dL — ABNORMAL LOW (ref 13.0–17.0)
Hemoglobin: 7.1 g/dL — ABNORMAL LOW (ref 13.0–17.0)
Hemoglobin: 7 g/dL — ABNORMAL LOW (ref 13.0–17.0)
Hemoglobin: 7.7 g/dL — ABNORMAL LOW (ref 13.0–17.0)
MCH: 27.6 pg (ref 26.0–34.0)
MCH: 28.1 pg (ref 26.0–34.0)
MCH: 27.3 pg (ref 26.0–34.0)
MCH: 27.3 pg (ref 26.0–34.0)
MCHC: 32.1 g/dL (ref 30.0–36.0)
MCHC: 32.6 g/dL (ref 30.0–36.0)
MCHC: 31.8 g/dL (ref 30.0–36.0)
MCHC: 32.4 g/dL (ref 30.0–36.0)
MCV: 85.8 fL (ref 78.0–100.0)
MCV: 86.2 fL (ref 78.0–100.0)
MCV: 85.9 fL (ref 78.0–100.0)
MCV: 84.4 fL (ref 78.0–100.0)
PLATELETS: 176 10*3/uL (ref 150–400)
PLATELETS: 165 10*3/uL (ref 150–400)
PLATELETS: 172 10*3/uL (ref 150–400)
Platelets: 173 10*3/uL (ref 150–400)
RBC: 2.54 MIL/uL — AB (ref 4.22–5.81)
RBC: 2.53 MIL/uL — AB (ref 4.22–5.81)
RBC: 2.56 MIL/uL — ABNORMAL LOW (ref 4.22–5.81)
RBC: 2.82 MIL/uL — ABNORMAL LOW (ref 4.22–5.81)
RDW: 16.6 % — AB (ref 11.5–15.5)
RDW: 17.4 % — ABNORMAL HIGH (ref 11.5–15.5)
RDW: 17.6 % — AB (ref 11.5–15.5)
RDW: 16.7 % — AB (ref 11.5–15.5)
WBC: 14.7 10*3/uL — ABNORMAL HIGH (ref 4.0–10.5)
WBC: 10.5 10*3/uL (ref 4.0–10.5)
WBC: 10 10*3/uL (ref 4.0–10.5)
WBC: 10.2 10*3/uL (ref 4.0–10.5)

## 2016-08-14 LAB — URINE DRUGS OF ABUSE SCREEN W ALC, ROUTINE (REF LAB)
AMPHETAMINES, URINE: NEGATIVE ng/mL
Barbiturate, Ur: NEGATIVE ng/mL
Benzodiazepine Quant, Ur: NEGATIVE ng/mL
Cannabinoid Quant, Ur: NEGATIVE ng/mL
ETHANOL U, QUAN: NEGATIVE %
METHADONE SCREEN, URINE: NEGATIVE ng/mL
Opiate Quant, Ur: NEGATIVE ng/mL
PROPOXYPHENE, URINE: NEGATIVE ng/mL
Phencyclidine, Ur: NEGATIVE ng/mL

## 2016-08-14 LAB — GLUCOSE, CAPILLARY
GLUCOSE-CAPILLARY: 79 mg/dL (ref 65–99)
Glucose-Capillary: 79 mg/dL (ref 65–99)
Glucose-Capillary: 88 mg/dL (ref 65–99)
Glucose-Capillary: 96 mg/dL (ref 65–99)

## 2016-08-14 LAB — PROTIME-INR
INR: 1.15
INR: 1.11
PROTHROMBIN TIME: 14.3 s (ref 11.4–15.2)
Prothrombin Time: 14.8 seconds (ref 11.4–15.2)

## 2016-08-14 LAB — COCAINE CONF, UR: Cocaine Metab Quant, Ur: POSITIVE — AB

## 2016-08-14 LAB — PREPARE RBC (CROSSMATCH)

## 2016-08-14 LAB — APTT: APTT: 30 s (ref 24–36)

## 2016-08-14 LAB — CULTURE, BLOOD (ROUTINE X 2)

## 2016-08-14 SURGERY — EGD (ESOPHAGOGASTRODUODENOSCOPY)
Anesthesia: Moderate Sedation

## 2016-08-14 MED ORDER — IOPAMIDOL (ISOVUE-300) INJECTION 61%
INTRAVENOUS | Status: AC
  Administered 2016-08-14: 10 mL
  Filled 2016-08-14: qty 100

## 2016-08-14 MED ORDER — MIDAZOLAM HCL 2 MG/2ML IJ SOLN
INTRAMUSCULAR | Status: AC
  Filled 2016-08-14: qty 4

## 2016-08-14 MED ORDER — MORPHINE SULFATE (PF) 2 MG/ML IV SOLN
1.0000 mg | Freq: Once | INTRAVENOUS | Status: AC
  Administered 2016-08-14: 1 mg via INTRAVENOUS
  Filled 2016-08-14: qty 1

## 2016-08-14 MED ORDER — MIDAZOLAM HCL 2 MG/2ML IJ SOLN
INTRAMUSCULAR | Status: AC
  Filled 2016-08-14: qty 2

## 2016-08-14 MED ORDER — IOPAMIDOL (ISOVUE-300) INJECTION 61%
INTRAVENOUS | Status: AC
  Administered 2016-08-14: 50 mL
  Filled 2016-08-14: qty 100

## 2016-08-14 MED ORDER — SODIUM CHLORIDE 0.9 % IV SOLN
Freq: Once | INTRAVENOUS | Status: AC
  Administered 2016-08-14: 18:00:00 via INTRAVENOUS

## 2016-08-14 MED ORDER — DEXTROSE-NACL 5-0.45 % IV SOLN
INTRAVENOUS | Status: DC
  Administered 2016-08-14: 17:00:00 via INTRAVENOUS

## 2016-08-14 MED ORDER — FENTANYL CITRATE (PF) 100 MCG/2ML IJ SOLN
INTRAMUSCULAR | Status: AC | PRN
  Administered 2016-08-14 (×5): 50 ug via INTRAVENOUS

## 2016-08-14 MED ORDER — LIDOCAINE HCL (PF) 1 % IJ SOLN
INTRAMUSCULAR | Status: DC | PRN
  Administered 2016-08-14: 5 mL

## 2016-08-14 MED ORDER — IOPAMIDOL (ISOVUE-300) INJECTION 61%
INTRAVENOUS | Status: AC
  Administered 2016-08-14: 50 mL
  Filled 2016-08-14: qty 150

## 2016-08-14 MED ORDER — EPINEPHRINE PF 1 MG/10ML IJ SOSY
PREFILLED_SYRINGE | INTRAMUSCULAR | Status: AC
Start: 2016-08-14 — End: 2016-08-14
  Filled 2016-08-14: qty 20

## 2016-08-14 MED ORDER — LIDOCAINE HCL (PF) 1 % IJ SOLN
INTRAMUSCULAR | Status: AC
  Filled 2016-08-14: qty 30

## 2016-08-14 MED ORDER — MIDAZOLAM HCL 2 MG/2ML IJ SOLN
INTRAMUSCULAR | Status: AC | PRN
  Administered 2016-08-14 (×5): 1 mg via INTRAVENOUS

## 2016-08-14 MED ORDER — ONDANSETRON HCL 4 MG/2ML IJ SOLN
4.0000 mg | Freq: Four times a day (QID) | INTRAMUSCULAR | Status: DC | PRN
  Administered 2016-08-14: 4 mg via INTRAVENOUS
  Filled 2016-08-14: qty 2

## 2016-08-14 MED ORDER — FENTANYL CITRATE (PF) 100 MCG/2ML IJ SOLN
INTRAMUSCULAR | Status: AC
  Filled 2016-08-14: qty 2

## 2016-08-14 MED ORDER — FENTANYL CITRATE (PF) 100 MCG/2ML IJ SOLN
INTRAMUSCULAR | Status: AC
  Filled 2016-08-14: qty 4

## 2016-08-14 MED ORDER — SODIUM CHLORIDE 0.9 % IV SOLN
Freq: Once | INTRAVENOUS | Status: AC
  Administered 2016-08-14: 09:00:00 via INTRAVENOUS

## 2016-08-14 MED ORDER — DEXTROSE 5 % IV SOLN
1.0000 g | INTRAVENOUS | Status: AC
  Administered 2016-08-14 – 2016-08-16 (×3): 1 g via INTRAVENOUS
  Filled 2016-08-14 (×4): qty 10

## 2016-08-14 NOTE — Sedation Documentation (Signed)
Patient is resting comfortably. 

## 2016-08-14 NOTE — Sedation Documentation (Signed)
Restless no pain

## 2016-08-14 NOTE — Sedation Documentation (Signed)
Pt restless  

## 2016-08-14 NOTE — Progress Notes (Signed)
PULMONARY / CRITICAL CARE MEDICINE   Name: Johnny [name] MRN: [id number] DOB: [birth year]      REFERRING MD:  ED MD   CHIEF COMPLAINT:  GIB, resp failure  HISTORY OF PRESENT ILLNESS:  61 yr old with PMHx of drug abuse with cocaine and etoh presented on 1/6 was found down with hematochezia and found to have an UGIB 2/2 bleeding duodenal ulcer.   SUBJECTIVE:  Overnight, patient complained of nausea and abdominal pain and was given morphine and zofran. Patient initially vomited up a small amount of pink frothy sputum which was followed by a large 500 cc bloody bowel movement with thick clots at 430 and another 500 cc bloody bowel movement around 6. Per nursing, bowel movement was bright frank blood. Patient's hemoglobin dropped overnight from 8.3 to 7 this morning. Vital signs are stable, in no distress. He denies any current complaints, abdomen is soft.   VITAL SIGNS: BP 138/78   Pulse (!) 108   Temp 98 F (36.7 C) (Oral)   Resp (!) 30   Wt 220 lb 3.8 oz (99.9 kg)   SpO2 91%   BMI 28.28 kg/m   HEMODYNAMICS: CVP:  [7 mmHg] 7 mmHg  VENTILATOR SETTINGS:  INTAKE / OUTPUT: I/O last 3 completed shifts: In: 1695 [I.V.:1625.8; Other:69.2] Out: 7060 [Urine:7060]  PHYSICAL EXAMINATION:  General: Pleasant, NAD Neuro: Awake, alert, follows commands, oriented Cardiovascular: Tachycardic, regular rhythm, no MRG Lungs:  Clear to auscultation bilaterally. No wheezing Abdomen:  Soft, NDNT, normoactive bowel sounds  GU: Foley in place Extremities: LEE b/l 1+  LABS:  BMET  Recent Labs Lab 08/12/16 1052 08/13/16 0815 08/14/16 0451  NA 136 142 141  K 5.2* 4.9 4.6  CL 100* 105 107  CO2 29 30 28   BUN 44* 31* 20  CREATININE 3.80* 2.15* 1.41*  GLUCOSE 142* 92 109*    Electrolytes  Recent Labs Lab 08/12/16 1052 08/13/16 0815 08/14/16 0451  CALCIUM 8.1* 8.3* 8.3*    CBC  Recent Labs Lab 08/13/16 1121 08/13/16 2106 08/14/16 0451  WBC 10.3 10.9* 14.7*  HGB 7.6* 8.3*  7.0*  HCT 23.7* 26.2* 21.8*  PLT 175 185 176    Coag's  Recent Labs Lab 08/10/16 1455 08/10/16 1700  APTT 25 26  INR 1.10 1.16    Sepsis Markers  Recent Labs Lab 08/10/16 1504 08/10/16 2000  LATICACIDVEN 3.46* 1.2    ABG  Recent Labs Lab 08/10/16 1720 08/12/16 0514  PHART 7.321* 7.390  PCO2ART 55.2* 48.1*  PO2ART 535.0* 38.0*    Liver Enzymes  Recent Labs Lab 08/10/16 1455 08/10/16 1700  AST 28 24  ALT 18 18  ALKPHOS 53 57  BILITOT <0.1* 0.2*  ALBUMIN 2.7* 2.6*    Cardiac Enzymes No results for input(s): TROPONINI, PROBNP in the last 168 hours.  Glucose  Recent Labs Lab 08/13/16 0810 08/13/16 1141 08/13/16 1559 08/13/16 1956 08/14/16 0007 08/14/16 0352  GLUCAP 94 120* 103* 152* 96 88    Imaging No results found. STUDIES:  1/6 CT head>>> Normal Brain 1/6 CXR >>>  Low lung volumes with probable airspace consolidation in the left lower lobe which may reflect pneumonia or sequela of aspiration. CXR 1/8 >> An removal of NG tube. Endotracheal tube and right IJ line stable Position. Low lung volumes. Persistent left base infiltrate suggesting pneumonia. Small left pleural effusion.  CULTURES: Sputum 1/6>> pending, not collected BCx 1/6>> gram positive rods in aerobic bottle only in 1/2, not detected on BCID- likely contaminant  ANTIBIOTICS: Ceftriaxone 1/6>>> 1/9 Augmentin 1/9 >>  SIGNIFICANT EVENTS: 1/6- GI bleed, change in MS, ett 1/7 > EGD revealed duodenal ulcer 1/8 > ETT removed, new foley placed 2/2 urethral injury 1/10 > BRBPR  LINES/TUBES: 1/6 ett>>>1/8 1/6 rt ij cordis>> 1/6 NGT >> 1/7  ASSESSMENT / PLAN:  PULMONARY A: Acute Respiratory Failure > now extubated Aspiration Pneumonia P:   Ceftriaxone 1/6 >> 1/9 Augmentin 1/9 >>  CARDIOVASCULAR A:  HTN Emergency > resolved, normotensive, but BPs creeping up P:  CVP 7 this am  RENAL A:   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 Cirrhosis- liver unremarkable on CT abdomen from 2015 P:   PPI gtt > plan to transition to PPI BID today  NPO given BRBPR GI following H. Pylori pending  HEMATOLOGIC A:   Anemia, GIB: dropped from 8.3 to 7.0 this am  Mild Leukocytosis > increased to 14.7 this am P:  SCDs Trend CBC Transfuse 1 unit of pRBC  INFECTIOUS  A:   Aspiration Pneumonia  P:   Ceftriaxone 1/6 >>1/9 Augment 1/9 >> BCx with 1/2 with gram negative rods without BCID, likely contaminant Sputum Cx pending HIV negative  ENDOCRINE A:   Hypoglycemia > resolved P:   Monitor on BMET  NEUROLOGIC A:   No Acute Issues P:    Monitor  FAMILY  - Updates: no family - Inter-disciplinary family meet or Palliative Care meeting due by:  1/13  Karlene LinemanAlexa Burns, DO PGY-3 Internal Medicine Resident Pager # [phone] 08/14/2016 7:32 AM

## 2016-08-14 NOTE — Sedation Documentation (Signed)
Restless denies pain 

## 2016-08-14 NOTE — Progress Notes (Signed)
OT Cancellation Note  Patient Details Name: Johnny [name] MRN: [id number] DOB: March 13, 1956   Cancelled Treatment:    Reason Eval/Treat Not Completed: Patient at procedure or test/ unavailable (transport present to take pt to IR). Will follow up for OT eval as time allows.  Gaye AlkenBailey A Liviana Mills M.S., OTR/L Pager: [phone]  08/14/2016, 10:22 AM

## 2016-08-14 NOTE — Consult Note (Signed)
Chief Complaint: Patient was seen in consultation today for mesenteric arteriogram with possible embolization Chief Complaint  Patient presents with  . GI Bleeding   at the request of Dr Curley Spice  Referring Physician(s): Dr Curley Spice  Supervising Physician: Ruel Favors  Patient Status: [facility] - In-pt  History of Present Illness: Johnny [name] is a 61 y.o. male   Found down at home 1/6 Known ETOH and Cocaine abuser Hematochezia Duodenal ulcer EGD and cautery/clipping 08/11/16: One duodenal ulcer with a visible vessel. Injected with EPI. Treated with bipolar cautery. Clips (MR conditional) were placed. - Non-bleeding esophageal ulcer. - Nodule found in the esophagus. ? OG trauma - The examination was otherwise normal. - No specimens collected.  Pt has been stable until this am No bleeding noted for few days GI was ready even to sign off RN reports this am 2 events of BRBPR  Hg from 8.3 to 7 this am INR 1.16 Cr better daily--now 1.4  Request for mesenteric arteriogram with possible embolization per dr Kendrick Fries Dr Miles Costain has reviewed imaging and approves procedure  Past Medical History:  Diagnosis Date  . Alcoholism (HCC)   . Arthritis   . Broken neck (HCC)   . Drug abuse    crack  . GERD (gastroesophageal reflux disease)   . H/O tinnitus   . Hydrocele   . Hypertension    no meds  . Loose, teeth     Past Surgical History:  Procedure Laterality Date  . ESOPHAGOGASTRODUODENOSCOPY N/A 08/11/2016   Procedure: ESOPHAGOGASTRODUODENOSCOPY (EGD);  Surgeon: Iva Boop, MD;  Location: [facility] ENDOSCOPY;  Service: Endoscopy;  Laterality: N/A;  In ICU  . HEMORRHOID SURGERY     as a teenager  . HYDROCELE EXCISION Right 04/15/2014   Procedure: RIGHT HYDROCELECTOMY ADULT;  Surgeon: Crist Fat, MD;  Location: [facility];  Service: Urology;  Laterality: Right;  . NECK SURGERY  1996  . TESTICLE SURGERY       Allergies: Ketamine  Medications: Prior to Admission medications   Medication Sig Start Date End Date Taking? Authorizing Provider  docusate sodium (COLACE) 100 MG capsule Take 1 capsule (100 mg total) by mouth 2 (two) times daily as needed (take to keep stool soft.). 04/15/14   Crist Fat, MD  methocarbamol (ROBAXIN) 500 MG tablet Take 1 tablet (500 mg total) by mouth 2 (two) times daily. 07/04/16   Earley Favor, NP  oxyCODONE (ROXICODONE) 5 MG immediate release tablet Take 1 tablet (5 mg total) by mouth every 4 (four) hours as needed. 04/15/14   Crist Fat, MD  oxyCODONE-acetaminophen (PERCOCET/ROXICET) 5-325 MG tablet Take 2 tablets by mouth every 4 (four) hours as needed for severe pain. Patient taking differently: Take 2 tablets by mouth every 4 (four) hours as needed (for pain).  06/23/16   Maia Plan, MD  oxyCODONE-acetaminophen (PERCOCET/ROXICET) 5-325 MG tablet Take 1 tablet by mouth every 4 (four) hours as needed (for pain).     Historical Provider, MD  predniSONE (DELTASONE) 20 MG tablet 3 Tabs PO Days 1-3, then 2 tabs PO Days 4-6, then 1 tab PO Day 7-9, then Half Tab PO Day 10-12 07/04/16   Earley Favor, NP     Family History  Problem Relation Age of Onset  . Colon cancer Paternal Uncle   . Heart disease Mother     father    Social History   Social History  . Marital status: Divorced    Spouse  name: N/A  . Number of children: N/A  . Years of education: N/A   Social History Main Topics  . Smoking status: Current Every Day Smoker    Packs/day: 0.25    Types: Cigarettes  . Smokeless tobacco: Never Used  . Alcohol use 50.4 oz/week    84 Cans of beer per week  . Drug use:      Comment: "crack"  . Sexual activity: Not Asked   Other Topics Concern  . None   Social History Narrative  . None    Review of Systems: A 12 point ROS discussed and pertinent positives are indicated in the HPI above.  All other systems are negative.  Review of Systems   Constitutional: Positive for activity change and appetite change.  Respiratory: Positive for shortness of breath.   Gastrointestinal: Positive for abdominal pain.  Neurological: Positive for weakness.  Psychiatric/Behavioral: Negative for behavioral problems and confusion.    Vital Signs: BP (!) 143/69   Pulse 98   Temp 99.1 F (37.3 C) (Axillary)   Resp 20   Wt 220 lb 3.8 oz (99.9 kg)   SpO2 98%   BMI 28.28 kg/m   Physical Exam  Constitutional: He is oriented to person, place, and time.  Cardiovascular: Normal rate.   Pulmonary/Chest: Effort normal. He has no wheezes.  Abdominal: Soft. Bowel sounds are normal. There is tenderness.  Musculoskeletal: Normal range of motion.  Neurological: He is alert and oriented to person, place, and time.  Skin: Skin is warm and dry.  Psychiatric: He has a normal mood and affect. His behavior is normal. Judgment and thought content normal.  Nursing note and vitals reviewed.   Mallampati Score:  MD Evaluation Airway: WNL Heart: WNL Abdomen: WNL Chest/ Lungs: WNL ASA  Classification: 3 Mallampati/Airway Score: One  Imaging: Ct Head Wo Contrast  Result Date: 08/10/2016 CLINICAL DATA:  Hypertension.  Probable GI bleed. EXAM: CT HEAD WITHOUT CONTRAST TECHNIQUE: Contiguous axial images were obtained from the base of the skull through the vertex without intravenous contrast. COMPARISON:  10/15/2013 FINDINGS: Brain: There is no intracranial hemorrhage, mass or evidence of acute infarction. There is no extra-axial fluid collection. Gray matter and white matter appear normal. Cerebral volume is normal for age. Brainstem and posterior fossa are unremarkable. The CSF spaces appear normal. Vascular: No hyperdense vessel or unexpected calcification. Skull: Normal. Negative for fracture or focal lesion. Sinuses/Orbits: No acute finding. Other: None. IMPRESSION: Normal brain Electronically Signed   By: Ellery Plunk M.D.   On: 08/10/2016 21:16   Dg  Chest Port 1 View  Result Date: 08/12/2016 CLINICAL DATA:  Intubation. EXAM: PORTABLE CHEST 1 VIEW COMPARISON:  08/10/2016. FINDINGS: Interim removal of NG tube. Endotracheal tube and right IJ line stable position. Heart size stable. Low lung volumes with mild basilar atelectasis. Persistent left base infiltrate. Tiny left pleural effusion. No pneumothorax. IMPRESSION: 1. An removal of NG tube. Endotracheal tube and right IJ line stable position. 2. Low lung volumes. Persistent left base infiltrate suggesting pneumonia. Small left pleural effusion. Electronically Signed   By: Maisie Fus  Register   On: 08/12/2016 06:58   Dg Chest Portable 1 View  Result Date: 08/10/2016 CLINICAL DATA:  61 year old male status post central line placement. EXAM: PORTABLE CHEST 1 VIEW COMPARISON:  Chest x-ray 08/10/2014. FINDINGS: New right IJ central line with tip terminating in the mid superior vena cava. A nasogastric tube is seen extending into the stomach, however, the tip of the nasogastric tube extends below the  lower margin of the image. An endotracheal tube is in place with tip 4.8 cm above the carina. Low lung volumes. Ill-defined opacity in the left lung base partially obscuring the left hemidiaphragm, concerning for airspace consolidation. Right lung appears clear. No pleural effusions. No evidence pulmonary edema. No pneumothorax. Heart size is borderline enlarged. Upper mediastinal contours are within normal limits. IMPRESSION: 1. Support apparatus, as above. 2. Low lung volumes with probable airspace consolidation in the left lower lobe which may reflect pneumonia or sequela of aspiration. Electronically Signed   By: Trudie Reedaniel  Entrikin M.D.   On: 08/10/2016 16:00   Dg Chest Portable 1 View  Result Date: 08/10/2016 CLINICAL DATA:  Status post intubation today. EXAM: PORTABLE CHEST 1 VIEW COMPARISON:  PA and lateral chest 05/09/2011. FINDINGS: Endotracheal tube is in place with tip in good position at the level of the  clavicular heads. NG tube courses into the stomach. Side port is in the distal esophagus. The tube should be advanced 5 cm for better positioning. Mild left basilar atelectasis is seen. Lungs are otherwise clear. No pneumothorax or pleural effusion. IMPRESSION: ETT in good position. NG tube should be advanced approximately 5 cm for better positioning. Subsegmental atelectasis left lung base. Electronically Signed   By: Drusilla Kannerhomas  Dalessio M.D.   On: 08/10/2016 15:28    Labs:  CBC:  Recent Labs  08/13/16 0414 08/13/16 1121 08/13/16 2106 08/14/16 0451  WBC 9.7 10.3 10.9* 14.7*  HGB 7.8* 7.6* 8.3* 7.0*  HCT 24.3* 23.7* 26.2* 21.8*  PLT 162 175 185 176    COAGS:  Recent Labs  08/10/16 1455 08/10/16 1700  INR 1.10 1.16  APTT 25 26    BMP:  Recent Labs  08/12/16 0400 08/12/16 1052 08/13/16 0815 08/14/16 0451  NA 136 136 142 141  K 5.2* 5.2* 4.9 4.6  CL 100* 100* 105 107  CO2 28 29 30 28   GLUCOSE 117* 142* 92 109*  BUN 43* 44* 31* 20  CALCIUM 7.8* 8.1* 8.3* 8.3*  CREATININE 3.36* 3.80* 2.15* 1.41*  GFRNONAA 18* 16* 31* 52*  GFRAA 21* 18* 36* >60    LIVER FUNCTION TESTS:  Recent Labs  08/10/16 1455 08/10/16 1700  BILITOT <0.1* 0.2*  AST 28 24  ALT 18 18  ALKPHOS 53 57  PROT 5.4* 5.4*  ALBUMIN 2.7* 2.6*    TUMOR MARKERS: No results for input(s): AFPTM, CEA, CA199, CHROMGRNA in the last 8760 hours.  Assessment and Plan:  Duodenal ulcer bleed Cautery/clipped in Endo 1/7 Now new BRBPR this am Hg 7 today Scheduled for mesenteric arteriogram with possible embolization Risks and Benefits discussed with the patient including, but not limited to bleeding, infection, vascular injury or contrast induced renal failure. All of the patient's questions were answered, patient is agreeable to proceed. Consent signed and in chart.  Thank you for this interesting consult.  I greatly enjoyed meeting Johnny [name] and look forward to participating in their care.  A copy  of this report was sent to the requesting provider on this date.  Electronically Signed: Suleika Donavan A 08/14/2016, 9:17 AM   I spent a total of 40 Minutes    in face to face in clinical consultation, greater than 50% of which was counseling/coordinating care for mesenteric arteriogram/ embo

## 2016-08-14 NOTE — Sedation Documentation (Signed)
Restless denies pain

## 2016-08-14 NOTE — Progress Notes (Signed)
Patient vomited ~ 500 cc of frank blood. Reports significant abdominal pain. BP stable in 170s systolic currently. Abdomen soft, non-distended, tender. Protecting airway. Last Hgb 8.3 yesterday evening.   - Check STAT CBC - Continue Protonix gtt - Monitor BP closely - Transfuse for Hgb < 7.0

## 2016-08-14 NOTE — Sedation Documentation (Signed)
Medicated for leg pain

## 2016-08-14 NOTE — Sedation Documentation (Signed)
resltess denies pain

## 2016-08-14 NOTE — Progress Notes (Signed)
Patient ID: Johnny [name], male   DOB: 12-Apr-1956, 61 y.o.   MRN: [id number]  [facility]  3 Days Post-Op  Subjective: Last night patient had 2 episodes BRBPR and hematemesis x1. Hg dropped from 8.3 yesterday to 7 today, currently receiving 1 uPRBC.  IR planning embolization today.  Currently denies abdominal pain. Medication controlling nausea.  Objective: Vital signs in last 24 hours: Temp:  [97.9 F (36.6 C)-99.1 F (37.3 C)] 99.1 F (37.3 C) (01/10 0858) Pulse Rate:  [94-119] 96 (01/10 1000) Resp:  [0-30] 22 (01/10 1000) BP: (129-174)/(66-94) 144/69 (01/10 1000) SpO2:  [85 %-100 %] 100 % (01/10 1000) FiO2 (%):  [45 %-50 %] 45 % (01/10 0730) Weight:  [220 lb 3.8 oz (99.9 kg)] 220 lb 3.8 oz (99.9 kg) (01/10 0245) Last BM Date: 08/12/16  Intake/Output from previous day: 01/09 0701 - 01/10 0700 In: 710 [I.V.:710] Out: 3610 [Urine:3610] Intake/Output this shift: Total I/O In: 155 [I.V.:105; Blood:50] Out: 425 [Urine:425]  PE: Gen:  Alert, NAD, pleasant Card:  RRR, no M/G/R heard Pulm:  CTAB, no W/R/R, effort normal Abd: Soft, NT/ND, +BS, no HSM  Lab Results:   Recent Labs  08/13/16 2106 08/14/16 0451  WBC 10.9* 14.7*  HGB 8.3* 7.0*  HCT 26.2* 21.8*  PLT 185 176   BMET  Recent Labs  08/13/16 0815 08/14/16 0451  NA 142 141  K 4.9 4.6  CL 105 107  CO2 30 28  GLUCOSE 92 109*  BUN 31* 20  CREATININE 2.15* 1.41*  CALCIUM 8.3* 8.3*   PT/INR  Recent Labs  08/14/16 0918  LABPROT 14.8  INR 1.15   CMP     Component Value Date/Time   NA 141 08/14/2016 0451   K 4.6 08/14/2016 0451   CL 107 08/14/2016 0451   CO2 28 08/14/2016 0451   GLUCOSE 109 (H) 08/14/2016 0451   BUN 20 08/14/2016 0451   CREATININE 1.41 (H) 08/14/2016 0451   CALCIUM 8.3 (L) 08/14/2016 0451   PROT 5.4 (L) 08/10/2016 1700   ALBUMIN 2.6 (L) 08/10/2016 1700   AST 24 08/10/2016 1700   ALT 18 08/10/2016 1700   ALKPHOS 57 08/10/2016 1700   BILITOT 0.2  (L) 08/10/2016 1700   GFRNONAA 52 (L) 08/14/2016 0451   GFRAA >60 08/14/2016 0451   Lipase     Component Value Date/Time   LIPASE 18 08/10/2016 1700       Studies/Results: No results found.  Anti-infectives: Anti-infectives    Start     Dose/Rate Route Frequency Ordered Stop   08/14/16 1000  cefTRIAXone (ROCEPHIN) 1 g in dextrose 5 % 50 mL IVPB     1 g 100 mL/hr over 30 Minutes Intravenous Every 24 hours 08/14/16 0918     08/13/16 1100  amoxicillin-clavulanate (AUGMENTIN) 875-125 MG per tablet 1 tablet  Status:  Discontinued     1 tablet Oral Every 12 hours 08/13/16 0947 08/14/16 0918   08/11/16 1600  cefTRIAXone (ROCEPHIN) 1 g in dextrose 5 % 50 mL IVPB  Status:  Discontinued     1 g 100 mL/hr over 30 Minutes Intravenous Every 24 hours 08/10/16 1610 08/11/16 1221   08/11/16 1600  cefTRIAXone (ROCEPHIN) 2 g in dextrose 5 % 50 mL IVPB  Status:  Discontinued     2 g 100 mL/hr over 30 Minutes Intravenous Every 24 hours 08/11/16 1221 08/13/16 0947   08/10/16 1500  cefTRIAXone (ROCEPHIN) 1 g in dextrose 5 % 50 mL IVPB  1 g 100 mL/hr over 30 Minutes Intravenous  Once 08/10/16 1449 08/10/16 1615       Assessment/Plan Upper GI bleed, duodenal ulcer with hemorrhage - s/p EGD 1/7, bleeding stopped with clipping/injection - Hg initially stable with no signs of bleeding - last night patient had 2 episodes BRBPR and hematemesis x1 - Hg dropped from 8.3 yesterday to 7 today - currently receiving 1 u PRBC   Plan - IR planning mesenteric arteriogram with possible embolization today. General surgery will continue to follow.   LOS: 4 days    Edson Snowball , [facility] 08/14/2016, 10:07 AM Pager: [phone] Consults: [phone] Mon-Fri 7:00 am-4:30 pm Sat-Sun 7:00 am-11:30 am

## 2016-08-14 NOTE — Progress Notes (Signed)
Nurse reports patient had another episode of hematemesis, approximately 500 cc again. Hgb 7.0. Given 1 unit PRBCs. Will need to call GI.

## 2016-08-14 NOTE — Procedures (Signed)
UGI recurrent bleed S/p visceral angiograms and GDA embolization  No comp Stable  Successful occlusion of GDA PSA  Full report in PACS

## 2016-08-14 NOTE — Progress Notes (Signed)
Daily Rounding Note  08/14/2016, 8:23 AM  LOS: 4 days   SUBJECTIVE:   Chief complaint: passing blood PR.  Last night had pain in lower abdomen, got Morphine.  Resulted in nausea and vomiting of pink tinged emesis.  Then had 2 more frankly bloody stools.  About 500 cc each.  Pain is gone, no recurrence of nausea/emesis   Pulse remains mildly tachycardic in low 100s. Not hypotensive.   Hgb  7.8.. 7.6.. 8.3.. 7.0.  1 additional PRBC ordered now.   Remains on PPI drip, was to end this afternoon.    OBJECTIVE:         Vital signs in last 24 hours:    Temp:  [97.9 F (36.6 C)-98.7 F (37.1 C)] 98.3 F (36.8 C) (01/10 0802) Pulse Rate:  [94-119] 108 (01/10 0600) Resp:  [0-30] 30 (01/10 0600) BP: (129-174)/(67-94) 138/78 (01/10 0600) SpO2:  [85 %-100 %] 91 % (01/10 0600) FiO2 (%):  [45 %-50 %] 50 % (01/10 0630) Weight:  [99.9 kg (220 lb 3.8 oz)] 99.9 kg (220 lb 3.8 oz) (01/10 0245) Last BM Date: 08/12/16 Filed Weights   08/12/16 0500 08/13/16 0600 08/14/16 0245  Weight: 107.7 kg (237 lb 7 oz) 102.7 kg (226 lb 6.6 oz) 99.9 kg (220 lb 3.8 oz)   General: alert, comfortable, pleasant   Heart: RRR, slightly tachy in low 100s Chest: clear bil.   Abdomen: soft, NT, ND.  BS hypoactive.  Extremities: 2 + pedal/LE edema. Neuro/Psych:  Oriented x 3.  Fully alert.  No gross deficits or tremor.    Intake/Output from previous day: 01/09 0701 - 01/10 0700 In: 675 [I.V.:675] Out: 3610 [Urine:3610]  Intake/Output this shift: No intake/output data recorded.  Lab Results:  Recent Labs  08/13/16 1121 08/13/16 2106 08/14/16 0451  WBC 10.3 10.9* 14.7*  HGB 7.6* 8.3* 7.0*  HCT 23.7* 26.2* 21.8*  PLT 175 185 176   BMET  Recent Labs  08/12/16 1052 08/13/16 0815 08/14/16 0451  NA 136 142 141  K 5.2* 4.9 4.6  CL 100* 105 107  CO2 29 30 28   GLUCOSE 142* 92 109*  BUN 44* 31* 20  CREATININE 3.80* 2.15* 1.41*  CALCIUM 8.1*  8.3* 8.3*   LFT No results for input(s): PROT, ALBUMIN, AST, ALT, ALKPHOS, BILITOT, BILIDIR, IBILI in the last 72 hours. PT/INR No results for input(s): LABPROT, INR in the last 72 hours. Hepatitis Panel No results for input(s): HEPBSAG, HCVAB, HEPAIGM, HEPBIGM in the last 72 hours.  Studies/Results: No results found.   Scheduled Meds: . sodium chloride  10 mL/hr Intravenous Once  . sodium chloride   Intravenous Once  . amoxicillin-clavulanate  1 tablet Oral Q12H  . folic acid  1 mg Oral Daily  . mouth rinse  15 mL Mouth Rinse BID  . thiamine  100 mg Oral Daily   Continuous Infusions: . pantoprozole (PROTONIX) infusion 8 mg/hr (08/14/16 0246)   PRN Meds:.sodium chloride, hydrALAZINE, ondansetron (ZOFRAN) IV   ASSESMENT:   * UGIB with hematemesis, hematochezia. 08/11/16 EGD: Solitary, non-bleeding DU with VV, bicapped and clipped. Non-bleediing esophageal ulcer. Esophageal nodule, ? NGT trauma.  H Pylori Ab still pending but suspect NSAID induced ulcers. Protonix drip through 1/10 1330.   BM on 1/8, none until scant hematemesis followed by hematochezia this AM.  With prodrome of abdominal pain before hematochezia, need to consider ischemic colitis in ddx.    * ABL anemia. S/p PRBC x 5.  Getting 6th today for hgb 7.0.    * Hypovolemic shock at presentation.  Not present currently.  2 D echo ordered 1/9 yesterday, not completed yet.   * Intubation after found down, now on Venturin mask. LLL infiltrate.  Rocephin >> Augmentin.  CT head normal. EEG 1/8: diffuse moderate slowing, non-specific diffuse cerebral dysfunction.   Tox screen pending, hx cocaine abuse.   *  Foley induced urethral injury per cystoscopy.  Gross GU bleeding resolved.     *  AKI. Hyperkalemia.  Resolving post resolution of urethral obstruction.    PLAN   *  Asked gen surgery to get reinvolved (made the call).  [facility] resident Dr Lawerance BachBurns is contacting IR.  GI working on arranging bedside EGD.      *  Continue PPI drip for another 48 hours.     Jennye MoccasinSarah Janeshia Ciliberto  08/14/2016, 8:23 AM Pager: [phone]

## 2016-08-15 ENCOUNTER — Inpatient Hospital Stay (HOSPITAL_COMMUNITY): Payer: Medicare Other

## 2016-08-15 DIAGNOSIS — K922 Gastrointestinal hemorrhage, unspecified: Secondary | ICD-10-CM

## 2016-08-15 DIAGNOSIS — K260 Acute duodenal ulcer with hemorrhage: Secondary | ICD-10-CM

## 2016-08-15 DIAGNOSIS — I10 Essential (primary) hypertension: Secondary | ICD-10-CM

## 2016-08-15 LAB — CULTURE, BLOOD (ROUTINE X 2): CULTURE: NO GROWTH

## 2016-08-15 LAB — GLUCOSE, CAPILLARY
GLUCOSE-CAPILLARY: 97 mg/dL (ref 65–99)
GLUCOSE-CAPILLARY: 94 mg/dL (ref 65–99)
GLUCOSE-CAPILLARY: 84 mg/dL (ref 65–99)
GLUCOSE-CAPILLARY: 120 mg/dL — AB (ref 65–99)
Glucose-Capillary: 109 mg/dL — ABNORMAL HIGH (ref 65–99)
Glucose-Capillary: 102 mg/dL — ABNORMAL HIGH (ref 65–99)

## 2016-08-15 LAB — BASIC METABOLIC PANEL
Anion gap: 7 (ref 5–15)
BUN: 13 mg/dL (ref 6–20)
CALCIUM: 8.2 mg/dL — AB (ref 8.9–10.3)
CO2: 25 mmol/L (ref 22–32)
CREATININE: 1.06 mg/dL (ref 0.61–1.24)
Chloride: 110 mmol/L (ref 101–111)
Glucose, Bld: 96 mg/dL (ref 65–99)
Potassium: 3.9 mmol/L (ref 3.5–5.1)
SODIUM: 142 mmol/L (ref 135–145)

## 2016-08-15 LAB — TYPE AND SCREEN
ABO/RH(D): A POS
Antibody Screen: POSITIVE
DAT, IGG: NEGATIVE
UNIT DIVISION: 0
Unit division: 0

## 2016-08-15 LAB — CBC
HCT: 23.6 % — ABNORMAL LOW (ref 39.0–52.0)
HCT: 24.2 % — ABNORMAL LOW (ref 39.0–52.0)
HEMATOCRIT: 24.6 % — AB (ref 39.0–52.0)
Hemoglobin: 7.7 g/dL — ABNORMAL LOW (ref 13.0–17.0)
Hemoglobin: 7.9 g/dL — ABNORMAL LOW (ref 13.0–17.0)
Hemoglobin: 8.1 g/dL — ABNORMAL LOW (ref 13.0–17.0)
MCH: 27.6 pg (ref 26.0–34.0)
MCH: 27.5 pg (ref 26.0–34.0)
MCH: 28.1 pg (ref 26.0–34.0)
MCHC: 32.6 g/dL (ref 30.0–36.0)
MCHC: 32.6 g/dL (ref 30.0–36.0)
MCHC: 32.9 g/dL (ref 30.0–36.0)
MCV: 84.6 fL (ref 78.0–100.0)
MCV: 84.3 fL (ref 78.0–100.0)
MCV: 85.4 fL (ref 78.0–100.0)
PLATELETS: 166 10*3/uL (ref 150–400)
PLATELETS: 184 10*3/uL (ref 150–400)
Platelets: 169 10*3/uL (ref 150–400)
RBC: 2.79 MIL/uL — AB (ref 4.22–5.81)
RBC: 2.87 MIL/uL — ABNORMAL LOW (ref 4.22–5.81)
RBC: 2.88 MIL/uL — ABNORMAL LOW (ref 4.22–5.81)
RDW: 16.9 % — ABNORMAL HIGH (ref 11.5–15.5)
RDW: 17 % — AB (ref 11.5–15.5)
RDW: 17 % — AB (ref 11.5–15.5)
WBC: 9.9 10*3/uL (ref 4.0–10.5)
WBC: 9.1 10*3/uL (ref 4.0–10.5)
WBC: 8.7 10*3/uL (ref 4.0–10.5)

## 2016-08-15 LAB — PROTIME-INR
INR: 1.13
PROTHROMBIN TIME: 14.6 s (ref 11.4–15.2)

## 2016-08-15 LAB — ECHOCARDIOGRAM COMPLETE: Weight: 3449.76 oz

## 2016-08-15 MED ORDER — FUROSEMIDE 10 MG/ML IJ SOLN: 40.0000 mg | Freq: Two times a day (BID) | INTRAMUSCULAR | Status: DC

## 2016-08-15 MED ORDER — PANTOPRAZOLE SODIUM 40 MG PO TBEC
40.0000 mg | DELAYED_RELEASE_TABLET | Freq: Two times a day (BID) | ORAL | Status: DC
  Administered 2016-08-16 – 2016-08-21 (×11): 40 mg via ORAL
  Filled 2016-08-15 (×11): qty 1

## 2016-08-15 MED ORDER — PANTOPRAZOLE SODIUM 40 MG IV SOLR: 40.0000 mg | Freq: Two times a day (BID) | INTRAVENOUS | Status: DC

## 2016-08-15 MED ORDER — MORPHINE SULFATE (PF) 2 MG/ML IV SOLN
2.0000 mg | INTRAVENOUS | Status: DC | PRN
  Administered 2016-08-15 – 2016-08-16 (×3): 2 mg via INTRAVENOUS
  Filled 2016-08-15 (×3): qty 1

## 2016-08-15 MED ORDER — OXYCODONE HCL 5 MG PO TABS
5.0000 mg | ORAL_TABLET | ORAL | Status: DC | PRN
  Administered 2016-08-15 – 2016-08-20 (×12): 5 mg via ORAL
  Filled 2016-08-15 (×12): qty 1

## 2016-08-15 MED ORDER — FUROSEMIDE 10 MG/ML IJ SOLN
40.0000 mg | Freq: Once | INTRAMUSCULAR | Status: AC
  Administered 2016-08-15: 40 mg via INTRAVENOUS
  Filled 2016-08-15: qty 4

## 2016-08-15 MED ORDER — HYDROCHLOROTHIAZIDE 25 MG PO TABS
25.0000 mg | ORAL_TABLET | Freq: Every day | ORAL | Status: DC
  Administered 2016-08-15 – 2016-08-21 (×7): 25 mg via ORAL
  Filled 2016-08-15 (×9): qty 1

## 2016-08-15 NOTE — Progress Notes (Signed)
Referring Physician(s): Dr Wilford Sports Feinstein  Supervising Physician: Richarda OverlieHenn, Johnny  [facility]  Chief Complaint:  Hematochezia  08/14/16: UGI recurrent bleed S/p visceral angiograms and GDA embolization No comp Stable Successful occlusion of GDA PSA   Subjective:  Doing well this am No complaints No bleeding No pain Eating liquid diet---no N/V  Hg 7.9 this am (7.1)  Allergies: Ketamine  Medications: Prior to Admission medications   Medication Sig Start Date End Date Taking? Authorizing Provider  docusate sodium (COLACE) 100 MG capsule Take 1 capsule (100 mg total) by mouth 2 (two) times daily as needed (take to keep stool soft.). 04/15/14   Crist FatBenjamin W Herrick, MD  methocarbamol (ROBAXIN) 500 MG tablet Take 1 tablet (500 mg total) by mouth 2 (two) times daily. 07/04/16   Earley FavorGail Schulz, NP  oxyCODONE (ROXICODONE) 5 MG immediate release tablet Take 1 tablet (5 mg total) by mouth every 4 (four) hours as needed. 04/15/14   Crist FatBenjamin W Herrick, MD  oxyCODONE-acetaminophen (PERCOCET/ROXICET) 5-325 MG tablet Take 2 tablets by mouth every 4 (four) hours as needed for severe pain. Patient taking differently: Take 2 tablets by mouth every 4 (four) hours as needed (for pain).  06/23/16   Maia PlanJoshua G Long, MD  oxyCODONE-acetaminophen (PERCOCET/ROXICET) 5-325 MG tablet Take 1 tablet by mouth every 4 (four) hours as needed (for pain).     Historical Provider, MD  predniSONE (DELTASONE) 20 MG tablet 3 Tabs PO Days 1-3, then 2 tabs PO Days 4-6, then 1 tab PO Day 7-9, then Half Tab PO Day 10-12 07/04/16   Earley FavorGail Schulz, NP     Vital Signs: BP (!) 164/85   Pulse (!) 102   Temp 99.2 F (37.3 C) (Oral)   Resp (!) 23   Wt 215 lb 9.8 oz (97.8 kg)   SpO2 100%   BMI 27.68 kg/m   Physical Exam  Pulmonary/Chest: Effort normal.  Abdominal: Soft.  Musculoskeletal: Normal range of motion.  Neurological: He is alert.  Skin: Skin is warm.  Right groin NT no bleeding No hematoma Rt  foot 2+ pulses  Nursing note and vitals reviewed.   Imaging: Ir Angiogram Visceral Selective  Result Date: 08/14/2016 INDICATION: Known active duodenal ulcer, status post endoscopic epi injection, cautery and endoscopic clipping. Recurrent GI bleeding with acute bright red blood per rectum. EXAM: SELECTIVE VISCERAL ARTERIOGRAPHY; ADDITIONAL ARTERIOGRAPHY; IR ULTRASOUND GUIDANCE VASC ACCESS RIGHT; IR EMBO ART VEN HEMORR LYMPH EXTRAV INC GUIDE ROADMAPPING; ARTERIOGRAPHY MEDICATIONS: None. ANESTHESIA/SEDATION: Moderate (conscious) sedation was employed during this procedure. A total of Versed 6.0 mg and Fentanyl 250 mcg was administered intravenously. Moderate Sedation Time: 70 Minutes. The patient's level of consciousness and vital signs were monitored continuously by radiology nursing throughout the procedure under my direct supervision. CONTRAST:  50mL ISOVUE-300 IOPAMIDOL (ISOVUE-300) INJECTION 61%, 50mL ISOVUE-300 IOPAMIDOL (ISOVUE-300) INJECTION 61%, 10mL ISOVUE-300 IOPAMIDOL (ISOVUE-300) INJECTION 61% FLUOROSCOPY TIME:  Fluoroscopy Time: 28 minutes 48 seconds (1,118 mGy). COMPLICATIONS: None immediate. PROCEDURE: Informed consent was obtained from the patient following explanation of the procedure, risks, benefits and alternatives. The patient understands, agrees and consents for the procedure. All questions were addressed. A time out was performed prior to the initiation of the procedure. Maximal barrier sterile technique utilized including caps, mask, sterile gowns, sterile gloves, large sterile drape, hand hygiene, and Betadine prep. Previous imaging and endoscopic pictures reviewed. Under sterile conditions and local anesthesia, ultrasound micropuncture access performed of the right common femoral artery. Five French sheath inserted. C2  catheter utilized to select the celiac origin. Selective celiac angiogram performed. Celiac: Celiac origin is widely patent. Splenic and hepatic vasculature are  patent. Over a Glidewire, the C2 catheter was advanced into the common hepatic artery. Common hepatic angiogram performed. Common hepatic: Common hepatic, GDA, proper hepatic, right hepatic and left hepatic arteries are all patent. Lantern microcatheter utilized over a fathom guidewire to eventually selected the gastroduodenal artery. Selected GDA angiogram performed. GDA: Gastroduodenal artery is patent as well as the gastroepiploic artery. 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 GDA embolization: Through the microcatheter, 3 mm x 30 mm (2) and 3 mm right 20 mm(1) Ruby micro coils were deployed within the GDA occluding the mid GDA pseudoaneurysm. Following embolization, final angiogram confirms occlusion of the GDA with no longer visualization of the pseudoaneurysm. Repeat common hepatic angiogram confirms patency of the hepatic vasculature. Access removed. Hemostasis obtained with a Exoseal device. No immediate complication. Patient tolerated the procedure well. IMPRESSION: Successful celiac, common hepatic, gastroduodenal angiograms. 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 Electronically Signed   By: Judie Petit.  Shick M.D.   On: 08/14/2016 12:47   Ir Angiogram Selective Each Additional Vessel  Result Date: 08/14/2016 INDICATION: Known active duodenal ulcer, status post endoscopic epi injection, cautery and endoscopic clipping. Recurrent GI bleeding with acute bright red blood per rectum. EXAM: SELECTIVE VISCERAL ARTERIOGRAPHY; ADDITIONAL ARTERIOGRAPHY; IR ULTRASOUND GUIDANCE VASC ACCESS RIGHT; IR EMBO ART VEN HEMORR LYMPH EXTRAV INC GUIDE ROADMAPPING; ARTERIOGRAPHY MEDICATIONS: None. ANESTHESIA/SEDATION: Moderate (conscious) sedation was employed during this procedure. A total of Versed 6.0 mg and Fentanyl 250 mcg was administered intravenously. Moderate Sedation Time: 70 Minutes. The patient's level of consciousness and vital  signs were monitored continuously by radiology nursing throughout the procedure under my direct supervision. CONTRAST:  50mL ISOVUE-300 IOPAMIDOL (ISOVUE-300) INJECTION 61%, 50mL ISOVUE-300 IOPAMIDOL (ISOVUE-300) INJECTION 61%, 10mL ISOVUE-300 IOPAMIDOL (ISOVUE-300) INJECTION 61% FLUOROSCOPY TIME:  Fluoroscopy Time: 28 minutes 48 seconds (1,118 mGy). COMPLICATIONS: None immediate. PROCEDURE: Informed consent was obtained from the patient following explanation of the procedure, risks, benefits and alternatives. The patient understands, agrees and consents for the procedure. All questions were addressed. A time out was performed prior to the initiation of the procedure. Maximal barrier sterile technique utilized including caps, mask, sterile gowns, sterile gloves, large sterile drape, hand hygiene, and Betadine prep. Previous imaging and endoscopic pictures reviewed. Under sterile conditions and local anesthesia, ultrasound micropuncture access performed of the right common femoral artery. Five French sheath inserted. C2 catheter utilized to select the celiac origin. Selective celiac angiogram performed. Celiac: Celiac origin is widely patent. Splenic and hepatic vasculature are patent. Over a Glidewire, the C2 catheter was advanced into the common hepatic artery. Common hepatic angiogram performed. Common hepatic: Common hepatic, GDA, proper hepatic, right hepatic and left hepatic arteries are all patent. Lantern microcatheter utilized over a fathom guidewire to eventually selected the gastroduodenal artery. Selected GDA angiogram performed. GDA: Gastroduodenal artery is patent as well as the gastroepiploic artery. 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 GDA embolization: Through the microcatheter, 3 mm x 30 mm (2) and 3 mm right 20 mm(1) Ruby micro coils were deployed within the GDA occluding the mid GDA pseudoaneurysm. Following embolization, final angiogram confirms occlusion of the GDA  with no longer visualization of the pseudoaneurysm. Repeat common hepatic angiogram confirms patency of the hepatic vasculature. Access removed. Hemostasis obtained with a Exoseal device. No immediate complication. Patient tolerated the  procedure well. IMPRESSION: Successful celiac, common hepatic, gastroduodenal angiograms. 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 Electronically Signed   By: Judie Petit.  Shick M.D.   On: 08/14/2016 12:47   Ir Angiogram Selective Each Additional Vessel  Result Date: 08/14/2016 INDICATION: Known active duodenal ulcer, status post endoscopic epi injection, cautery and endoscopic clipping. Recurrent GI bleeding with acute bright red blood per rectum. EXAM: SELECTIVE VISCERAL ARTERIOGRAPHY; ADDITIONAL ARTERIOGRAPHY; IR ULTRASOUND GUIDANCE VASC ACCESS RIGHT; IR EMBO ART VEN HEMORR LYMPH EXTRAV INC GUIDE ROADMAPPING; ARTERIOGRAPHY MEDICATIONS: None. ANESTHESIA/SEDATION: Moderate (conscious) sedation was employed during this procedure. A total of Versed 6.0 mg and Fentanyl 250 mcg was administered intravenously. Moderate Sedation Time: 70 Minutes. The patient's level of consciousness and vital signs were monitored continuously by radiology nursing throughout the procedure under my direct supervision. CONTRAST:  50mL ISOVUE-300 IOPAMIDOL (ISOVUE-300) INJECTION 61%, 50mL ISOVUE-300 IOPAMIDOL (ISOVUE-300) INJECTION 61%, 10mL ISOVUE-300 IOPAMIDOL (ISOVUE-300) INJECTION 61% FLUOROSCOPY TIME:  Fluoroscopy Time: 28 minutes 48 seconds (1,118 mGy). COMPLICATIONS: None immediate. PROCEDURE: Informed consent was obtained from the patient following explanation of the procedure, risks, benefits and alternatives. The patient understands, agrees and consents for the procedure. All questions were addressed. A time out was performed prior to the initiation of the procedure. Maximal barrier sterile technique utilized including caps, mask, sterile  gowns, sterile gloves, large sterile drape, hand hygiene, and Betadine prep. Previous imaging and endoscopic pictures reviewed. Under sterile conditions and local anesthesia, ultrasound micropuncture access performed of the right common femoral artery. Five French sheath inserted. C2 catheter utilized to select the celiac origin. Selective celiac angiogram performed. Celiac: Celiac origin is widely patent. Splenic and hepatic vasculature are patent. Over a Glidewire, the C2 catheter was advanced into the common hepatic artery. Common hepatic angiogram performed. Common hepatic: Common hepatic, GDA, proper hepatic, right hepatic and left hepatic arteries are all patent. Lantern microcatheter utilized over a fathom guidewire to eventually selected the gastroduodenal artery. Selected GDA angiogram performed. GDA: Gastroduodenal artery is patent as well as the gastroepiploic artery. 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 GDA embolization: Through the microcatheter, 3 mm x 30 mm (2) and 3 mm right 20 mm(1) Ruby micro coils were deployed within the GDA occluding the mid GDA pseudoaneurysm. Following embolization, final angiogram confirms occlusion of the GDA with no longer visualization of the pseudoaneurysm. Repeat common hepatic angiogram confirms patency of the hepatic vasculature. Access removed. Hemostasis obtained with a Exoseal device. No immediate complication. Patient tolerated the procedure well. IMPRESSION: Successful celiac, common hepatic, gastroduodenal angiograms. 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 Electronically Signed   By: Judie Petit.  Shick M.D.   On: 08/14/2016 12:47   Ir Angiogram Follow Up Study  Result Date: 08/14/2016 INDICATION: Known active duodenal ulcer, status post endoscopic epi injection, cautery and endoscopic clipping. Recurrent GI bleeding with acute bright red blood per rectum. EXAM: SELECTIVE  VISCERAL ARTERIOGRAPHY; ADDITIONAL ARTERIOGRAPHY; IR ULTRASOUND GUIDANCE VASC ACCESS RIGHT; IR EMBO ART VEN HEMORR LYMPH EXTRAV INC GUIDE ROADMAPPING; ARTERIOGRAPHY MEDICATIONS: None. ANESTHESIA/SEDATION: Moderate (conscious) sedation was employed during this procedure. A total of Versed 6.0 mg and Fentanyl 250 mcg was administered intravenously. Moderate Sedation Time: 70 Minutes. The patient's level of consciousness and vital signs were monitored continuously by radiology nursing throughout the procedure under my direct supervision. CONTRAST:  50mL ISOVUE-300 IOPAMIDOL (ISOVUE-300) INJECTION 61%, 50mL ISOVUE-300 IOPAMIDOL (ISOVUE-300) INJECTION 61%, 10mL ISOVUE-300 IOPAMIDOL (ISOVUE-300) INJECTION 61%  FLUOROSCOPY TIME:  Fluoroscopy Time: 28 minutes 48 seconds (1,118 mGy). COMPLICATIONS: None immediate. PROCEDURE: Informed consent was obtained from the patient following explanation of the procedure, risks, benefits and alternatives. The patient understands, agrees and consents for the procedure. All questions were addressed. A time out was performed prior to the initiation of the procedure. Maximal barrier sterile technique utilized including caps, mask, sterile gowns, sterile gloves, large sterile drape, hand hygiene, and Betadine prep. Previous imaging and endoscopic pictures reviewed. Under sterile conditions and local anesthesia, ultrasound micropuncture access performed of the right common femoral artery. Five French sheath inserted. C2 catheter utilized to select the celiac origin. Selective celiac angiogram performed. Celiac: Celiac origin is widely patent. Splenic and hepatic vasculature are patent. Over a Glidewire, the C2 catheter was advanced into the common hepatic artery. Common hepatic angiogram performed. Common hepatic: Common hepatic, GDA, proper hepatic, right hepatic and left hepatic arteries are all patent. Lantern microcatheter utilized over a fathom guidewire to eventually selected the  gastroduodenal artery. Selected GDA angiogram performed. GDA: Gastroduodenal artery is patent as well as the gastroepiploic artery. 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 GDA embolization: Through the microcatheter, 3 mm x 30 mm (2) and 3 mm right 20 mm(1) Ruby micro coils were deployed within the GDA occluding the mid GDA pseudoaneurysm. Following embolization, final angiogram confirms occlusion of the GDA with no longer visualization of the pseudoaneurysm. Repeat common hepatic angiogram confirms patency of the hepatic vasculature. Access removed. Hemostasis obtained with a Exoseal device. No immediate complication. Patient tolerated the procedure well. IMPRESSION: Successful celiac, common hepatic, gastroduodenal angiograms. 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 Electronically Signed   By: Judie Petit.  Shick M.D.   On: 08/14/2016 12:47   Ir US Guide Vasc Access Right  Result Date: 08/14/2016 INDICATION: Known active duodenal ulcer, status post endoscopic epi injection, cautery and endoscopic clipping. Recurrent GI bleeding with acute bright red blood per rectum. EXAM: SELECTIVE VISCERAL ARTERIOGRAPHY; ADDITIONAL ARTERIOGRAPHY; IR ULTRASOUND GUIDANCE VASC ACCESS RIGHT; IR EMBO ART VEN HEMORR LYMPH EXTRAV INC GUIDE ROADMAPPING; ARTERIOGRAPHY MEDICATIONS: None. ANESTHESIA/SEDATION: Moderate (conscious) sedation was employed during this procedure. A total of Versed 6.0 mg and Fentanyl 250 mcg was administered intravenously. Moderate Sedation Time: 70 Minutes. The patient's level of consciousness and vital signs were monitored continuously by radiology nursing throughout the procedure under my direct supervision. CONTRAST:  50mL ISOVUE-300 IOPAMIDOL (ISOVUE-300) INJECTION 61%, 50mL ISOVUE-300 IOPAMIDOL (ISOVUE-300) INJECTION 61%, 10mL ISOVUE-300 IOPAMIDOL (ISOVUE-300) INJECTION 61% FLUOROSCOPY TIME:  Fluoroscopy Time: 28 minutes  48 seconds (1,118 mGy). COMPLICATIONS: None immediate. PROCEDURE: Informed consent was obtained from the patient following explanation of the procedure, risks, benefits and alternatives. The patient understands, agrees and consents for the procedure. All questions were addressed. A time out was performed prior to the initiation of the procedure. Maximal barrier sterile technique utilized including caps, mask, sterile gowns, sterile gloves, large sterile drape, hand hygiene, and Betadine prep. Previous imaging and endoscopic pictures reviewed. Under sterile conditions and local anesthesia, ultrasound micropuncture access performed of the right common femoral artery. Five French sheath inserted. C2 catheter utilized to select the celiac origin. Selective celiac angiogram performed. Celiac: Celiac origin is widely patent. Splenic and hepatic vasculature are patent. Over a Glidewire, the C2 catheter was advanced into the common hepatic artery. Common hepatic angiogram performed. Common hepatic: Common hepatic, GDA, proper hepatic, right hepatic and left hepatic arteries are all patent. Lantern microcatheter utilized over a fathom  guidewire to eventually selected the gastroduodenal artery. Selected GDA angiogram performed. GDA: Gastroduodenal artery is patent as well as the gastroepiploic artery. 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 GDA embolization: Through the microcatheter, 3 mm x 30 mm (2) and 3 mm right 20 mm(1) Ruby micro coils were deployed within the GDA occluding the mid GDA pseudoaneurysm. Following embolization, final angiogram confirms occlusion of the GDA with no longer visualization of the pseudoaneurysm. Repeat common hepatic angiogram confirms patency of the hepatic vasculature. Access removed. Hemostasis obtained with a Exoseal device. No immediate complication. Patient tolerated the procedure well. IMPRESSION: Successful celiac, common hepatic, gastroduodenal angiograms. Mid  GDA small pseudoaneurysm demonstrated with successful GDA micro coil embolization as detailed above. Electronically Signed   By: Judie Petit.  Shick M.D.   On: 08/14/2016 12:47   Dg Chest Port 1 View  Result Date: 08/14/2016 CLINICAL DATA:  61 y/o  M; shortness of breath and congestion today. EXAM: PORTABLE CHEST 1 VIEW COMPARISON:  08/12/2016 chest radiograph. FINDINGS: Stable cardiac silhouette within normal limits. Interval development of diffuse hazy opacities of the lungs. Possible trace bilateral pleural effusions. Bones are unremarkable. IMPRESSION: Interval development of diffuse hazy opacities of the lungs probably represents pulmonary edema. Possible trace bilateral pleural effusions. Electronically Signed   By: Mitzi Hansen M.D.   On: 08/14/2016 14:36   Dg Chest Port 1 View  Result Date: 08/12/2016 CLINICAL DATA:  Intubation. EXAM: PORTABLE CHEST 1 VIEW COMPARISON:  08/10/2016. FINDINGS: Interim removal of NG tube. Endotracheal tube and right IJ line stable position. Heart size stable. Low lung volumes with mild basilar atelectasis. Persistent left base infiltrate. Tiny left pleural effusion. No pneumothorax. IMPRESSION: 1. An removal of NG tube. Endotracheal tube and right IJ line stable position. 2. Low lung volumes. Persistent left base infiltrate suggesting pneumonia. Small left pleural effusion. Electronically Signed   By: Maisie Fus  Register   On: 08/12/2016 06:58   Ir Embo Art  [facility] [employer] Guide Roadmapping  Result Date: 08/14/2016 INDICATION: Known active duodenal ulcer, status post endoscopic epi injection, cautery and endoscopic clipping. Recurrent GI bleeding with acute bright red blood per rectum. EXAM: SELECTIVE VISCERAL ARTERIOGRAPHY; ADDITIONAL ARTERIOGRAPHY; IR ULTRASOUND GUIDANCE VASC ACCESS RIGHT; IR EMBO ART VEN HEMORR LYMPH EXTRAV INC GUIDE ROADMAPPING; ARTERIOGRAPHY MEDICATIONS: None. ANESTHESIA/SEDATION: Moderate (conscious) sedation was employed during  this procedure. A total of Versed 6.0 mg and Fentanyl 250 mcg was administered intravenously. Moderate Sedation Time: 70 Minutes. The patient's level of consciousness and vital signs were monitored continuously by radiology nursing throughout the procedure under my direct supervision. CONTRAST:  50mL ISOVUE-300 IOPAMIDOL (ISOVUE-300) INJECTION 61%, 50mL ISOVUE-300 IOPAMIDOL (ISOVUE-300) INJECTION 61%, 10mL ISOVUE-300 IOPAMIDOL (ISOVUE-300) INJECTION 61% FLUOROSCOPY TIME:  Fluoroscopy Time: 28 minutes 48 seconds (1,118 mGy). COMPLICATIONS: None immediate. PROCEDURE: Informed consent was obtained from the patient following explanation of the procedure, risks, benefits and alternatives. The patient understands, agrees and consents for the procedure. All questions were addressed. A time out was performed prior to the initiation of the procedure. Maximal barrier sterile technique utilized including caps, mask, sterile gowns, sterile gloves, large sterile drape, hand hygiene, and Betadine prep. Previous imaging and endoscopic pictures reviewed. Under sterile conditions and local anesthesia, ultrasound micropuncture access performed of the right common femoral artery. Five French sheath inserted. C2 catheter utilized to select the celiac origin. Selective celiac angiogram performed. Celiac: Celiac origin is widely patent. Splenic and hepatic vasculature are patent. Over a Glidewire, the C2 catheter was advanced  into the common hepatic artery. Common hepatic angiogram performed. Common hepatic: Common hepatic, GDA, proper hepatic, right hepatic and left hepatic arteries are all patent. Lantern microcatheter utilized over a fathom guidewire to eventually selected the gastroduodenal artery. Selected GDA angiogram performed. GDA: Gastroduodenal artery is patent as well as the gastroepiploic artery. 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 GDA embolization: Through the microcatheter, 3 mm x 30 mm (2)  and 3 mm right 20 mm(1) Ruby micro coils were deployed within the GDA occluding the mid GDA pseudoaneurysm. Following embolization, final angiogram confirms occlusion of the GDA with no longer visualization of the pseudoaneurysm. Repeat common hepatic angiogram confirms patency of the hepatic vasculature. Access removed. Hemostasis obtained with a Exoseal device. No immediate complication. Patient tolerated the procedure well. IMPRESSION: Successful celiac, common hepatic, gastroduodenal angiograms. 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 Electronically Signed   By: Judie Petit.  Shick M.D.   On: 08/14/2016 12:47    Labs:  CBC:  Recent Labs  08/14/16 1625 08/14/16 2042 08/15/16 0142 08/15/16 0644  WBC 10.0 10.2 9.9 9.1  HGB 7.0* 7.7* 7.7* 7.9*  HCT 22.0* 23.8* 23.6* 24.2*  PLT 172 173 166 184    COAGS:  Recent Labs  08/10/16 1455 08/10/16 1700 08/14/16 0918 08/14/16 1243 08/15/16 0644  INR 1.10 1.16 1.15 1.11 1.13  APTT 25 26 30   --   --     BMP:  Recent Labs  08/12/16 1052 08/13/16 0815 08/14/16 0451 08/15/16 0644  NA 136 142 141 142  K 5.2* 4.9 4.6 3.9  CL 100* 105 107 110  CO2 29 30 28 25   GLUCOSE 142* 92 109* 96  BUN 44* 31* 20 13  CALCIUM 8.1* 8.3* 8.3* 8.2*  CREATININE 3.80* 2.15* 1.41* 1.06  GFRNONAA 16* 31* 52* >60  GFRAA 18* 36* >60 >60    LIVER FUNCTION TESTS:  Recent Labs  08/10/16 1455 08/10/16 1700  BILITOT <0.1* 0.2*  AST 28 24  ALT 18 18  ALKPHOS 53 57  PROT 5.4* 5.4*  ALBUMIN 2.7* 2.6*    Assessment and Plan:  Post GDA embolization in IR 08/14/16 Doing well No further bleeding  Electronically Signed: David Rodriquez A 08/15/2016, 10:33 AM   I spent a total of 15 Minutes at the the patient's bedside AND on the patient's hospital floor or unit, greater than 50% of which was counseling/coordinating care for GDA embo

## 2016-08-15 NOTE — Progress Notes (Signed)
PULMONARY / CRITICAL CARE MEDICINE   Name: Johnny [name] MRN: [id number] DOB: May 14, 1956      REFERRING MD:  ED MD   CHIEF COMPLAINT:  GIB, resp failure  HISTORY OF PRESENT ILLNESS:  61 yr old with PMHx of drug abuse with cocaine and etoh presented on 1/6 was found down with hematochezia and found to have an UGIB 2/2 bleeding duodenal ulcer s/p coil embolization of GDA.   SUBJECTIVE:  Yesterday, patient underwent coil embolization of GDA for GIB. He had a recurrent bloody bowel movement overnight, small with old blood and clots. Endoscopic clip found in stool, not unexpected. GI was contacted, but no call back. Hemoglobin dipped to 7.0 yesterday and patient was transfused one unit of pRBC. Patient continues to have lower abdominal pain.   VITAL SIGNS: BP (!) 151/83 (BP Location: Left Arm)   Pulse 100   Temp 97.7 F (36.5 C) (Axillary)   Resp 18   Wt 215 lb 9.8 oz (97.8 kg)   SpO2 97%   BMI 27.68 kg/m   HEMODYNAMICS:    VENTILATOR SETTINGS:  INTAKE / OUTPUT: I/O last 3 completed shifts: In: 2787.9 [I.V.:1596.7; Blood:1141.3; IV Piggyback:50] Out: 4175 [Urine:4175]  PHYSICAL EXAMINATION:  General: Pleasant, NAD Neuro: Awake, alert, follows commands, oriented Cardiovascular: Tachycardic, regular rhythm, no MRG Lungs:  Faint crackles in lower lung fields. No wheezing or rhonchi. Abdomen:  Soft, NDNT, normoactive bowel sounds  GU: Foley in place Extremities: LEE b/l 1+  LABS:  BMET  Recent Labs Lab 08/13/16 0815 08/14/16 0451 08/15/16 0644  NA 142 141 142  K 4.9 4.6 3.9  CL 105 107 110  CO2 30 28 25   BUN 31* 20 13  CREATININE 2.15* 1.41* 1.06  GLUCOSE 92 109* 96    Electrolytes  Recent Labs Lab 08/13/16 0815 08/14/16 0451 08/15/16 0644  CALCIUM 8.3* 8.3* 8.2*    CBC  Recent Labs Lab 08/14/16 2042 08/15/16 0142 08/15/16 0644  WBC 10.2 9.9 9.1  HGB 7.7* 7.7* 7.9*  HCT 23.8* 23.6* 24.2*  PLT 173 166 184    Coag's  Recent Labs Lab  08/10/16 1455 08/10/16 1700 08/14/16 0918 08/14/16 1243 08/15/16 0644  APTT 25 26 30   --   --   INR 1.10 1.16 1.15 1.11 1.13    Sepsis Markers  Recent Labs Lab 08/10/16 1504 08/10/16 2000  LATICACIDVEN 3.46* 1.2    ABG  Recent Labs Lab 08/10/16 1720 08/12/16 0514  PHART 7.321* 7.390  PCO2ART 55.2* 48.1*  PO2ART 535.0* 38.0*    Liver Enzymes  Recent Labs Lab 08/10/16 1455 08/10/16 1700  AST 28 24  ALT 18 18  ALKPHOS 53 57  BILITOT <0.1* 0.2*  ALBUMIN 2.7* 2.6*    Cardiac Enzymes No results for input(s): TROPONINI, PROBNP in the last 168 hours.  Glucose  Recent Labs Lab 08/14/16 0007 08/14/16 0352 08/14/16 0759 08/14/16 1544 08/15/16 0337 08/15/16 0814  GLUCAP 96 88 79 79 97 94    Imaging Ir Angiogram Visceral Selective  Result Date: 08/14/2016 INDICATION: Known active duodenal ulcer, status post endoscopic epi injection, cautery and endoscopic clipping. Recurrent GI bleeding with acute bright red blood per rectum. EXAM: SELECTIVE VISCERAL ARTERIOGRAPHY; ADDITIONAL ARTERIOGRAPHY; IR ULTRASOUND GUIDANCE VASC ACCESS RIGHT; IR EMBO ART VEN HEMORR LYMPH EXTRAV INC GUIDE ROADMAPPING; ARTERIOGRAPHY MEDICATIONS: None. ANESTHESIA/SEDATION: Moderate (conscious) sedation was employed during this procedure. A total of Versed 6.0 mg and Fentanyl 250 mcg was administered intravenously. Moderate Sedation Time: 70 Minutes. The patient's level of  consciousness and vital signs were monitored continuously by radiology nursing throughout the procedure under my direct supervision. CONTRAST:  50mL ISOVUE-300 IOPAMIDOL (ISOVUE-300) INJECTION 61%, 50mL ISOVUE-300 IOPAMIDOL (ISOVUE-300) INJECTION 61%, 10mL ISOVUE-300 IOPAMIDOL (ISOVUE-300) INJECTION 61% FLUOROSCOPY TIME:  Fluoroscopy Time: 28 minutes 48 seconds (1,118 mGy). COMPLICATIONS: None immediate. PROCEDURE: Informed consent was obtained from the patient following explanation of the procedure, risks, benefits and  alternatives. The patient understands, agrees and consents for the procedure. All questions were addressed. A time out was performed prior to the initiation of the procedure. Maximal barrier sterile technique utilized including caps, mask, sterile gowns, sterile gloves, large sterile drape, hand hygiene, and Betadine prep. Previous imaging and endoscopic pictures reviewed. Under sterile conditions and local anesthesia, ultrasound micropuncture access performed of the right common femoral artery. Five French sheath inserted. C2 catheter utilized to select the celiac origin. Selective celiac angiogram performed. Celiac: Celiac origin is widely patent. Splenic and hepatic vasculature are patent. Over a Glidewire, the C2 catheter was advanced into the common hepatic artery. Common hepatic angiogram performed. Common hepatic: Common hepatic, GDA, proper hepatic, right hepatic and left hepatic arteries are all patent. Lantern microcatheter utilized over a fathom guidewire to eventually selected the gastroduodenal artery. Selected GDA angiogram performed. GDA: Gastroduodenal artery is patent as well as the gastroepiploic artery. 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 GDA embolization: Through the microcatheter, 3 mm x 30 mm (2) and 3 mm right 20 mm(1) Ruby micro coils were deployed within the GDA occluding the mid GDA pseudoaneurysm. Following embolization, final angiogram confirms occlusion of the GDA with no longer visualization of the pseudoaneurysm. Repeat common hepatic angiogram confirms patency of the hepatic vasculature. Access removed. Hemostasis obtained with a Exoseal device. No immediate complication. Patient tolerated the procedure well. IMPRESSION: Successful celiac, common hepatic, gastroduodenal angiograms. 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 Electronically Signed   By: Judie Petit.  Shick M.D.   On: 08/14/2016 12:47    Ir Angiogram Selective Each Additional Vessel  Result Date: 08/14/2016 INDICATION: Known active duodenal ulcer, status post endoscopic epi injection, cautery and endoscopic clipping. Recurrent GI bleeding with acute bright red blood per rectum. EXAM: SELECTIVE VISCERAL ARTERIOGRAPHY; ADDITIONAL ARTERIOGRAPHY; IR ULTRASOUND GUIDANCE VASC ACCESS RIGHT; IR EMBO ART VEN HEMORR LYMPH EXTRAV INC GUIDE ROADMAPPING; ARTERIOGRAPHY MEDICATIONS: None. ANESTHESIA/SEDATION: Moderate (conscious) sedation was employed during this procedure. A total of Versed 6.0 mg and Fentanyl 250 mcg was administered intravenously. Moderate Sedation Time: 70 Minutes. The patient's level of consciousness and vital signs were monitored continuously by radiology nursing throughout the procedure under my direct supervision. CONTRAST:  50mL ISOVUE-300 IOPAMIDOL (ISOVUE-300) INJECTION 61%, 50mL ISOVUE-300 IOPAMIDOL (ISOVUE-300) INJECTION 61%, 10mL ISOVUE-300 IOPAMIDOL (ISOVUE-300) INJECTION 61% FLUOROSCOPY TIME:  Fluoroscopy Time: 28 minutes 48 seconds (1,118 mGy). COMPLICATIONS: None immediate. PROCEDURE: Informed consent was obtained from the patient following explanation of the procedure, risks, benefits and alternatives. The patient understands, agrees and consents for the procedure. All questions were addressed. A time out was performed prior to the initiation of the procedure. Maximal barrier sterile technique utilized including caps, mask, sterile gowns, sterile gloves, large sterile drape, hand hygiene, and Betadine prep. Previous imaging and endoscopic pictures reviewed. Under sterile conditions and local anesthesia, ultrasound micropuncture access performed of the right common femoral artery. Five French sheath inserted. C2 catheter utilized to select the celiac origin. Selective celiac angiogram performed. Celiac: Celiac origin is widely patent. Splenic and hepatic vasculature are patent. Over a  Glidewire, the C2 catheter was advanced  into the common hepatic artery. Common hepatic angiogram performed. Common hepatic: Common hepatic, GDA, proper hepatic, right hepatic and left hepatic arteries are all patent. Lantern microcatheter utilized over a fathom guidewire to eventually selected the gastroduodenal artery. Selected GDA angiogram performed. GDA: Gastroduodenal artery is patent as well as the gastroepiploic artery. 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 GDA embolization: Through the microcatheter, 3 mm x 30 mm (2) and 3 mm right 20 mm(1) Ruby micro coils were deployed within the GDA occluding the mid GDA pseudoaneurysm. Following embolization, final angiogram confirms occlusion of the GDA with no longer visualization of the pseudoaneurysm. Repeat common hepatic angiogram confirms patency of the hepatic vasculature. Access removed. Hemostasis obtained with a Exoseal device. No immediate complication. Patient tolerated the procedure well. IMPRESSION: Successful celiac, common hepatic, gastroduodenal angiograms. 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 Electronically Signed   By: Judie PetitM.  Shick M.D.   On: 08/14/2016 12:47   Ir Angiogram Selective Each Additional Vessel  Result Date: 08/14/2016 INDICATION: Known active duodenal ulcer, status post endoscopic epi injection, cautery and endoscopic clipping. Recurrent GI bleeding with acute bright red blood per rectum. EXAM: SELECTIVE VISCERAL ARTERIOGRAPHY; ADDITIONAL ARTERIOGRAPHY; IR ULTRASOUND GUIDANCE VASC ACCESS RIGHT; IR EMBO ART VEN HEMORR LYMPH EXTRAV INC GUIDE ROADMAPPING; ARTERIOGRAPHY MEDICATIONS: None. ANESTHESIA/SEDATION: Moderate (conscious) sedation was employed during this procedure. A total of Versed 6.0 mg and Fentanyl 250 mcg was administered intravenously. Moderate Sedation Time: 70 Minutes. The patient's level of consciousness and vital signs were monitored continuously by radiology nursing  throughout the procedure under my direct supervision. CONTRAST:  50mL ISOVUE-300 IOPAMIDOL (ISOVUE-300) INJECTION 61%, 50mL ISOVUE-300 IOPAMIDOL (ISOVUE-300) INJECTION 61%, 10mL ISOVUE-300 IOPAMIDOL (ISOVUE-300) INJECTION 61% FLUOROSCOPY TIME:  Fluoroscopy Time: 28 minutes 48 seconds (1,118 mGy). COMPLICATIONS: None immediate. PROCEDURE: Informed consent was obtained from the patient following explanation of the procedure, risks, benefits and alternatives. The patient understands, agrees and consents for the procedure. All questions were addressed. A time out was performed prior to the initiation of the procedure. Maximal barrier sterile technique utilized including caps, mask, sterile gowns, sterile gloves, large sterile drape, hand hygiene, and Betadine prep. Previous imaging and endoscopic pictures reviewed. Under sterile conditions and local anesthesia, ultrasound micropuncture access performed of the right common femoral artery. Five French sheath inserted. C2 catheter utilized to select the celiac origin. Selective celiac angiogram performed. Celiac: Celiac origin is widely patent. Splenic and hepatic vasculature are patent. Over a Glidewire, the C2 catheter was advanced into the common hepatic artery. Common hepatic angiogram performed. Common hepatic: Common hepatic, GDA, proper hepatic, right hepatic and left hepatic arteries are all patent. Lantern microcatheter utilized over a fathom guidewire to eventually selected the gastroduodenal artery. Selected GDA angiogram performed. GDA: Gastroduodenal artery is patent as well as the gastroepiploic artery. 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 GDA embolization: Through the microcatheter, 3 mm x 30 mm (2) and 3 mm right 20 mm(1) Ruby micro coils were deployed within the GDA occluding the mid GDA pseudoaneurysm. Following embolization, final angiogram confirms occlusion of the GDA with no longer visualization of the pseudoaneurysm.  Repeat common hepatic angiogram confirms patency of the hepatic vasculature. Access removed. Hemostasis obtained with a Exoseal device. No immediate complication. Patient tolerated the procedure well. IMPRESSION: Successful celiac, common hepatic, gastroduodenal angiograms. 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 Electronically Signed  By: Osvaldo Shipper M.D.   On: 08/14/2016 12:47   Ir Angiogram Follow Up Study  Result Date: 08/14/2016 INDICATION: Known active duodenal ulcer, status post endoscopic epi injection, cautery and endoscopic clipping. Recurrent GI bleeding with acute bright red blood per rectum. EXAM: SELECTIVE VISCERAL ARTERIOGRAPHY; ADDITIONAL ARTERIOGRAPHY; IR ULTRASOUND GUIDANCE VASC ACCESS RIGHT; IR EMBO ART VEN HEMORR LYMPH EXTRAV INC GUIDE ROADMAPPING; ARTERIOGRAPHY MEDICATIONS: None. ANESTHESIA/SEDATION: Moderate (conscious) sedation was employed during this procedure. A total of Versed 6.0 mg and Fentanyl 250 mcg was administered intravenously. Moderate Sedation Time: 70 Minutes. The patient's level of consciousness and vital signs were monitored continuously by radiology nursing throughout the procedure under my direct supervision. CONTRAST:  50mL ISOVUE-300 IOPAMIDOL (ISOVUE-300) INJECTION 61%, 50mL ISOVUE-300 IOPAMIDOL (ISOVUE-300) INJECTION 61%, 10mL ISOVUE-300 IOPAMIDOL (ISOVUE-300) INJECTION 61% FLUOROSCOPY TIME:  Fluoroscopy Time: 28 minutes 48 seconds (1,118 mGy). COMPLICATIONS: None immediate. PROCEDURE: Informed consent was obtained from the patient following explanation of the procedure, risks, benefits and alternatives. The patient understands, agrees and consents for the procedure. All questions were addressed. A time out was performed prior to the initiation of the procedure. Maximal barrier sterile technique utilized including caps, mask, sterile gowns, sterile gloves, large sterile drape, hand hygiene, and Betadine  prep. Previous imaging and endoscopic pictures reviewed. Under sterile conditions and local anesthesia, ultrasound micropuncture access performed of the right common femoral artery. Five French sheath inserted. C2 catheter utilized to select the celiac origin. Selective celiac angiogram performed. Celiac: Celiac origin is widely patent. Splenic and hepatic vasculature are patent. Over a Glidewire, the C2 catheter was advanced into the common hepatic artery. Common hepatic angiogram performed. Common hepatic: Common hepatic, GDA, proper hepatic, right hepatic and left hepatic arteries are all patent. Lantern microcatheter utilized over a fathom guidewire to eventually selected the gastroduodenal artery. Selected GDA angiogram performed. GDA: Gastroduodenal artery is patent as well as the gastroepiploic artery. 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 GDA embolization: Through the microcatheter, 3 mm x 30 mm (2) and 3 mm right 20 mm(1) Ruby micro coils were deployed within the GDA occluding the mid GDA pseudoaneurysm. Following embolization, final angiogram confirms occlusion of the GDA with no longer visualization of the pseudoaneurysm. Repeat common hepatic angiogram confirms patency of the hepatic vasculature. Access removed. Hemostasis obtained with a Exoseal device. No immediate complication. Patient tolerated the procedure well. IMPRESSION: Successful celiac, common hepatic, gastroduodenal angiograms. 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 Electronically Signed   By: Judie Petit.  Shick M.D.   On: 08/14/2016 12:47   Ir US Guide Vasc Access Right  Result Date: 08/14/2016 INDICATION: Known active duodenal ulcer, status post endoscopic epi injection, cautery and endoscopic clipping. Recurrent GI bleeding with acute bright red blood per rectum. EXAM: SELECTIVE VISCERAL ARTERIOGRAPHY; ADDITIONAL ARTERIOGRAPHY; IR ULTRASOUND GUIDANCE  VASC ACCESS RIGHT; IR EMBO ART VEN HEMORR LYMPH EXTRAV INC GUIDE ROADMAPPING; ARTERIOGRAPHY MEDICATIONS: None. ANESTHESIA/SEDATION: Moderate (conscious) sedation was employed during this procedure. A total of Versed 6.0 mg and Fentanyl 250 mcg was administered intravenously. Moderate Sedation Time: 70 Minutes. The patient's level of consciousness and vital signs were monitored continuously by radiology nursing throughout the procedure under my direct supervision. CONTRAST:  50mL ISOVUE-300 IOPAMIDOL (ISOVUE-300) INJECTION 61%, 50mL ISOVUE-300 IOPAMIDOL (ISOVUE-300) INJECTION 61%, 10mL ISOVUE-300 IOPAMIDOL (ISOVUE-300) INJECTION 61% FLUOROSCOPY TIME:  Fluoroscopy Time: 28 minutes 48 seconds (1,118 mGy). COMPLICATIONS: None immediate. PROCEDURE: Informed consent was obtained from the patient following explanation of the procedure,  risks, benefits and alternatives. The patient understands, agrees and consents for the procedure. All questions were addressed. A time out was performed prior to the initiation of the procedure. Maximal barrier sterile technique utilized including caps, mask, sterile gowns, sterile gloves, large sterile drape, hand hygiene, and Betadine prep. Previous imaging and endoscopic pictures reviewed. Under sterile conditions and local anesthesia, ultrasound micropuncture access performed of the right common femoral artery. Five French sheath inserted. C2 catheter utilized to select the celiac origin. Selective celiac angiogram performed. Celiac: Celiac origin is widely patent. Splenic and hepatic vasculature are patent. Over a Glidewire, the C2 catheter was advanced into the common hepatic artery. Common hepatic angiogram performed. Common hepatic: Common hepatic, GDA, proper hepatic, right hepatic and left hepatic arteries are all patent. Lantern microcatheter utilized over a fathom guidewire to eventually selected the gastroduodenal artery. Selected GDA angiogram performed. GDA: Gastroduodenal  artery is patent as well as the gastroepiploic artery. 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 GDA embolization: Through the microcatheter, 3 mm x 30 mm (2) and 3 mm right 20 mm(1) Ruby micro coils were deployed within the GDA occluding the mid GDA pseudoaneurysm. Following embolization, final angiogram confirms occlusion of the GDA with no longer visualization of the pseudoaneurysm. Repeat common hepatic angiogram confirms patency of the hepatic vasculature. Access removed. Hemostasis obtained with a Exoseal device. No immediate complication. Patient tolerated the procedure well. IMPRESSION: Successful celiac, common hepatic, gastroduodenal angiograms. 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 Electronically Signed   By: Judie Petit.  Shick M.D.   On: 08/14/2016 12:47   Dg Chest Port 1 View  Result Date: 08/14/2016 CLINICAL DATA:  61 y/o  M; shortness of breath and congestion today. EXAM: PORTABLE CHEST 1 VIEW COMPARISON:  08/12/2016 chest radiograph. FINDINGS: Stable cardiac silhouette within normal limits. Interval development of diffuse hazy opacities of the lungs. Possible trace bilateral pleural effusions. Bones are unremarkable. IMPRESSION: Interval development of diffuse hazy opacities of the lungs probably represents pulmonary edema. Possible trace bilateral pleural effusions. Electronically Signed   By: Mitzi Hansen M.D.   On: 08/14/2016 14:36   Ir Embo Art  [facility] [employer] Guide Roadmapping  Result Date: 08/14/2016 INDICATION: Known active duodenal ulcer, status post endoscopic epi injection, cautery and endoscopic clipping. Recurrent GI bleeding with acute bright red blood per rectum. EXAM: SELECTIVE VISCERAL ARTERIOGRAPHY; ADDITIONAL ARTERIOGRAPHY; IR ULTRASOUND GUIDANCE VASC ACCESS RIGHT; IR EMBO ART VEN HEMORR LYMPH EXTRAV INC GUIDE ROADMAPPING; ARTERIOGRAPHY MEDICATIONS: None.  ANESTHESIA/SEDATION: Moderate (conscious) sedation was employed during this procedure. A total of Versed 6.0 mg and Fentanyl 250 mcg was administered intravenously. Moderate Sedation Time: 70 Minutes. The patient's level of consciousness and vital signs were monitored continuously by radiology nursing throughout the procedure under my direct supervision. CONTRAST:  50mL ISOVUE-300 IOPAMIDOL (ISOVUE-300) INJECTION 61%, 50mL ISOVUE-300 IOPAMIDOL (ISOVUE-300) INJECTION 61%, 10mL ISOVUE-300 IOPAMIDOL (ISOVUE-300) INJECTION 61% FLUOROSCOPY TIME:  Fluoroscopy Time: 28 minutes 48 seconds (1,118 mGy). COMPLICATIONS: None immediate. PROCEDURE: Informed consent was obtained from the patient following explanation of the procedure, risks, benefits and alternatives. The patient understands, agrees and consents for the procedure. All questions were addressed. A time out was performed prior to the initiation of the procedure. Maximal barrier sterile technique utilized including caps, mask, sterile gowns, sterile gloves, large sterile drape, hand hygiene, and Betadine prep. Previous imaging and endoscopic pictures reviewed. Under sterile conditions and local anesthesia, ultrasound micropuncture access performed of the right  common femoral artery. Five French sheath inserted. C2 catheter utilized to select the celiac origin. Selective celiac angiogram performed. Celiac: Celiac origin is widely patent. Splenic and hepatic vasculature are patent. Over a Glidewire, the C2 catheter was advanced into the common hepatic artery. Common hepatic angiogram performed. Common hepatic: Common hepatic, GDA, proper hepatic, right hepatic and left hepatic arteries are all patent. Lantern microcatheter utilized over a fathom guidewire to eventually selected the gastroduodenal artery. Selected GDA angiogram performed. GDA: Gastroduodenal artery is patent as well as the gastroepiploic artery. Mid GDA focal bulging noted compatible with a GDA  pseudoaneurysm. GDA embolization: Through the microcatheter, 3 mm x 30 mm (2) and 3 mm right 20 mm(1) Ruby micro coils were deployed within the GDA occluding the mid GDA pseudoaneurysm. Following embolization, final angiogram confirms occlusion of the GDA with no longer visualization of the pseudoaneurysm. Repeat common hepatic angiogram confirms patency of the hepatic vasculature. Access removed. Hemostasis obtained with a Exoseal device. No immediate complication. Patient tolerated the procedure well. IMPRESSION: Successful celiac, common hepatic, gastroduodenal angiograms. 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 Electronically Signed   By: Judie Petit.  Shick M.D.   On: 08/14/2016 12:47   STUDIES:  1/6 CT head>>> Normal Brain 1/6 CXR >>>  Low lung volumes with probable airspace consolidation in the left lower lobe which may reflect pneumonia or sequela of aspiration. CXR 1/8 >> An removal of NG tube. Endotracheal tube and right IJ line stable Position. Low lung volumes. Persistent left base infiltrate suggesting pneumonia. Small left pleural effusion. CXR 1/10 >> Interval development of diffuse hazy opacities of the lungs probably represents pulmonary edema. Possible trace bilateral pleural effusions.  CULTURES: Sputum 1/6>> pending, not collected BCx 1/6>> gram positive rods in aerobic bottle only in 1/2, not detected on BCID- likely contaminant   ANTIBIOTICS: Ceftriaxone 1/6>>> 1/9; 1/10 >> Augmentin 1/9 >>1/10  SIGNIFICANT EVENTS: 1/6- GI bleed, change in MS, ett 1/7 > EGD revealed duodenal ulcer 1/8 > ETT removed, new foley placed 2/2 urethral injury 1/10 > BRBPR, underwent coil embolization of GDA  LINES/TUBES: 1/6 ett>>>1/8 1/6 rt ij cordis>> 1/6 NGT >> 1/7  ASSESSMENT / PLAN:  PULMONARY A: Acute Respiratory Failure > now extubated Aspiration Pneumonia P:   Ceftriaxone 1/6 >> 1/9; 1/10 >> Augmentin 1/9 >>  1/10  CARDIOVASCULAR A:  HTN  Volume Overload > evidence of pulmonary edema on CXR P:  CVP 12 this am Consider adding antihypertensive once hemodynamically stable and less concern for rebleeding Echo pending Would give lasix 40 mg IV BID today  RENAL A:   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 Cirrhosis- liver unremarkable on CT abdomen from 2015 P:   PPI gtt NPO given BRBPR On D5-1/2 NS at 50 cc/hr GI following H. Pylori pending  HEMATOLOGIC A:   Anemia, GIB P:  SCDs Trend CBC Transfuse to keep >7  INFECTIOUS  A:   Aspiration Pneumonia  P:   Ceftriaxone 1/6 >>1/9; 1/10 >> Augmentin 1/9 >>1/10 BCx with 1/2 with gram negative rods without BCID, likely contaminant  ENDOCRINE A:   Hypoglycemia > resolved P:   Monitor on BMET On D5-1/2 NS  NEUROLOGIC A:   No Acute Issues P:  Monitor  FAMILY  - Updates: family updated 1/10 - Inter-disciplinary family meet or Palliative Care meeting due by:  1/13  Karlene Lineman, DO PGY-3 Internal Medicine Resident Pager # [phone] 08/15/2016 8:17 AM

## 2016-08-15 NOTE — Progress Notes (Signed)
Physical Therapy Treatment Patient Details Name: Johnny [name] MRN: [id number] DOB: 1955-12-20 Today's Date: 08/15/2016    History of Present Illness 61 yr old with PMHx of drug abuse with cocaine and etoh presented on 1/6 was found down with hematochezia and found to have an UGIB 2/2 bleeding duodenal ulcer. . H/o broken neck 1996.    PT Comments    Pt with improved cognition, mobility, and function but limited by RLE deficits, fatigue and generalized weakness. Pressure applied to right groin throughout mobility as much as physically possible to continue to mobilize pt. Pt educated for safety, transfers, gait deviation and discharge plan. Pt reports altered gait for 3 months since fall and reports no fx or gross injury to RLE from fall which was confirmed by PT note from 11/30 at which time pt also with limited gait and 2 person assist. Will continue to follow to maximize mobility and function.   HR 105 sats 95% on RA   Follow Up Recommendations  SNF;Supervision/Assistance - 24 hour     Equipment Recommendations  3in1 (PT)    Recommendations for Other Services       Precautions / Restrictions Precautions Precautions: Fall Precaution Comments: hold pressure on right groin with mobility Restrictions Weight Bearing Restrictions: No    Mobility  Bed Mobility Overal bed mobility: Needs Assistance Bed Mobility: Supine to Sit     Supine to sit: Max assist     General bed mobility comments: cues for sequence with assist to bring legs fully to EOB and elevate trunk with HOB grossly 30 degrees  Transfers Overall transfer level: Needs assistance   Transfers: Sit to/from Stand Sit to Stand: Mod assist;Max assist;+2 physical assistance;+2 safety/equipment         General transfer comment: pt able to stand from low bed with 2 person assist for anterior translation and rise with right knee blocked mod assist. With rise from chair max +2 with max cues for hand placement and  right knee blocked with increased difficulty rising and inability to fully stand at RW on 2nd trial  Ambulation/Gait Ambulation/Gait assistance: Min assist;+2 safety/equipment Ambulation Distance (Feet): 6 Feet Assistive device: Rolling walker (2 wheeled) Gait Pattern/deviations: Step-to pattern;Decreased stance time - right;Trunk flexed   Gait velocity interpretation: Below normal speed for age/gender General Gait Details: pt with maintained right hip and knee flexion during stance, foot drop which pt reports as baseline x 3 months since a fall. Pt states he has seen an MD since then. pt with cues for sequence and posture   Stairs            Wheelchair Mobility    Modified Rankin (Stroke Patients Only)       Balance Overall balance assessment: Needs assistance   Sitting balance-Leahy Scale: Fair       Standing balance-Leahy Scale: Poor                      Cognition Arousal/Alertness: Awake/alert Behavior During Therapy: WFL for tasks assessed/performed Overall Cognitive Status: No family/caregiver present to determine baseline cognitive functioning Area of Impairment: Safety/judgement;Problem solving                    Exercises      General Comments        Pertinent Vitals/Pain Pain Assessment: 0-10 Pain Score: 5  Pain Location: right groin Pain Descriptors / Indicators: Sore Pain Intervention(s): Limited activity within patient's tolerance;Monitored during session;Repositioned    Home  Living Family/patient expects to be discharged to:: Private residence Living Arrangements: Parent;Other relatives (mother and brother) Available Help at Discharge: Family;Available 24 hours/day Type of Home: House Home Access: Stairs to enter   Home Layout: One level Home Equipment: Gilmer Mor - single point Additional Comments: brother works during the day, 43 y.o. mother home with pt 24/7    Prior Function Level of Independence: Needs assistance  Gait /  Transfers Assistance Needed: cane for mobility prior to fall ~3 months ago. RW for short distance community mobility but has primarily been at home for the past 3 months  ADL's / Homemaking Assistance Needed: assist with dressing. only sponge bathes for the past 3 months     PT Goals (current goals can now be found in the care plan section) Progress towards PT goals: Progressing toward goals    Frequency           PT Plan Current plan remains appropriate    Co-evaluation PT/OT/SLP Co-Evaluation/Treatment: Yes Reason for Co-Treatment: For patient/therapist safety         End of Session Equipment Utilized During Treatment: Gait belt Activity Tolerance: Patient tolerated treatment well Patient left: in chair;with call bell/phone within reach     Time: 0827-0853 PT Time Calculation (min) (ACUTE ONLY): 26 min  Charges:                       G Codes:      Iann Rodier B Erik Burkett 09/02/16, 9:11 AM Delaney Meigs, PT [phone]

## 2016-08-15 NOTE — Progress Notes (Signed)
Paged GI regarding foreign object in stool. Will continue to monitor. Suzy Bouchardhompson, Renae Mottley E, RN 08/15/2016 12:47 AM

## 2016-08-15 NOTE — Progress Notes (Signed)
  Echocardiogram 2D Echocardiogram has been performed.  Janie Strothman 08/15/2016, 10:00 AM

## 2016-08-15 NOTE — Evaluation (Signed)
Occupational Therapy Evaluation Patient Details Name: Johnny [name] MRN: [id number] DOB: 09-11-1955 Today's Date: 08/15/2016    History of Present Illness 60 yr old with PMHx of drug abuse with cocaine and etoh presented on 1/6 was found down with hematochezia and found to have an UGIB 2/2 bleeding duodenal ulcer. . H/o broken neck 1996.   Clinical Impression   Pt reports he required min assist for ADL PTA. Currently pt requires min assist +2 for short distance functional mobility and mod-max +2 for sit to stand. Pt required max-total assist for ADL at this time. Pt presenting with generalized weakness, deconditioning, quick to fatigue, impaired sitting and standing balance, and decreased cognition impacting his independence and safety with ADL and functional mobility. Recommending SNF for follow up to maximize independence and safety with ADL and functional mobility prior to return home. Pt would benefit from continued skilled OT to address established goals.    Follow Up Recommendations  SNF;Supervision/Assistance - 24 hour    Equipment Recommendations  3 in 1 bedside commode;Tub/shower bench    Recommendations for Other Services       Precautions / Restrictions Precautions Precautions: Fall Precaution Comments: hold pressure on right groin with mobility Restrictions Weight Bearing Restrictions: No      Mobility Bed Mobility Overal bed mobility: Needs Assistance Bed Mobility: Supine to Sit     Supine to sit: Max assist     General bed mobility comments: cues for sequence with assist to bring legs fully to EOB and elevate trunk with HOB grossly 30 degrees  Transfers Overall transfer level: Needs assistance Equipment used: Rolling walker (2 wheeled) Transfers: Sit to/from Stand Sit to Stand: Mod assist;Max assist;+2 physical assistance;+2 safety/equipment         General transfer comment: pt able to stand from low bed with 2 person assist for anterior translation  and rise with right knee blocked mod assist. With rise from chair max +2 with max cues for hand placement and right knee blocked with increased difficulty rising and inability to fully stand at RW on 2nd trial    Balance Overall balance assessment: Needs assistance Sitting-balance support: Feet supported;No upper extremity supported Sitting balance-Leahy Scale: Fair     Standing balance support: Bilateral upper extremity supported Standing balance-Leahy Scale: Poor                              ADL Overall ADL's : Needs assistance/impaired     Grooming: Moderate assistance;Sitting   Upper Body Bathing: Maximal assistance;Sitting   Lower Body Bathing: Total assistance;Sit to/from stand;+2 for physical assistance   Upper Body Dressing : Maximal assistance;Sitting   Lower Body Dressing: Total assistance;+2 for physical assistance;Sit to/from stand   Toilet Transfer: Moderate assistance;Ambulation;+2 for physical assistance;RW Toilet Transfer Details (indicate cue type and reason): no BSC in room; placed bed pan in chair Toileting- Clothing Manipulation and Hygiene: Total assistance;+2 for physical assistance;Sit to/from stand       Functional mobility during ADLs: Minimal assistance;Rolling walker;+2 for physical assistance General ADL Comments: Pt attempting to have BM; noted to have blood in stool (RN aware). VSS throughout.      Vision     Perception     Praxis      Pertinent Vitals/Pain Pain Assessment: 0-10 Pain Score: 5  Pain Location: R groin Pain Descriptors / Indicators: Sore Pain Intervention(s): Limited activity within patient's tolerance;Monitored during session;Repositioned     Hand Dominance  Extremity/Trunk Assessment Upper Extremity Assessment Upper Extremity Assessment: Generalized weakness   Lower Extremity Assessment Lower Extremity Assessment: Defer to PT evaluation   Cervical / Trunk Assessment Cervical / Trunk Assessment:  Kyphotic   Communication Communication Communication: No difficulties   Cognition Arousal/Alertness: Awake/alert Behavior During Therapy: WFL for tasks assessed/performed Overall Cognitive Status: No family/caregiver present to determine baseline cognitive functioning Area of Impairment: Safety/judgement;Problem solving         Safety/Judgement: Decreased awareness of safety;Decreased awareness of deficits   Problem Solving: Slow processing;Decreased initiation;Difficulty sequencing;Requires verbal cues;Requires tactile cues     General Comments       Exercises       Shoulder Instructions      Home Living Family/patient expects to be discharged to:: Private residence Living Arrangements: Parent;Other relatives (mother and brother) Available Help at Discharge: Family;Available 24 hours/day Type of Home: House Home Access: Stairs to enter Enter[employer] CorporationEntrance Stairs-Number of Steps: 1   Home Layout: One level     Bathroom Shower/Tub: Tub/shower unit Shower/tub characteristics: Engineer, building servicesCurtain Bathroom Toilet: Standard     Home Equipment: Medical laboratory scientific officerCane - single point   Additional Comments: brother works during the day, 63 y.o. mother home with pt 24/7      Prior Functioning/Environment Level of Independence: Needs assistance  Gait / Transfers Assistance Needed: cane for mobility prior to fall ~3 months ago. RW for short distance community mobility but has primarily been at home for the past 3 months  ADL's / Homemaking Assistance Needed: assist with dressing. only sponge bathes for the past 3 months            OT Problem List: Decreased strength;Decreased range of motion;Decreased activity tolerance;Impaired balance (sitting and/or standing);Decreased coordination;Decreased cognition;Decreased safety awareness;Decreased knowledge of use of DME or AE;Decreased knowledge of precautions;Pain   OT Treatment/Interventions: Self-care/ADL training;Therapeutic exercise;Energy conservation;DME  and/or AE instruction;Therapeutic activities;Cognitive remediation/compensation;Patient/family education;Balance training    OT Goals(Current goals can be found in the care plan section) Acute Rehab OT Goals Patient Stated Goal: to go home OT Goal Formulation: With patient Time For Goal Achievement: 08/29/16 Potential to Achieve Goals: Good ADL Goals Pt Will Perform Grooming: with set-up;with supervision;sitting Pt Will Perform Upper Body Bathing: with supervision;sitting Pt Will Perform Lower Body Bathing: with min assist;sit to/from stand Pt Will Perform Upper Body Dressing: with supervision;sitting Pt Will Perform Lower Body Dressing: with min assist;sit to/from stand Pt Will Transfer to Toilet: with min assist;ambulating;bedside commode (BSC over toilet) Pt Will Perform Toileting - Clothing Manipulation and hygiene: with min assist;sit to/from stand  OT Frequency: Min 2X/week   Barriers to D/C: Decreased caregiver support          Co-evaluation PT/OT/SLP Co-Evaluation/Treatment: Yes Reason for Co-Treatment: For patient/therapist safety   OT goals addressed during session: ADL's and self-care      End of Session Equipment Utilized During Treatment: Gait belt;Rolling walker Nurse Communication: Mobility status;Other (comment) (blood in stool)  Activity Tolerance: Patient tolerated treatment well Patient left: in chair;with call bell/phone within reach   Time: 0824-0854 OT Time Calculation (min): 30 min Charges:  OT General Charges $OT Visit: 1 Procedure OT Evaluation $OT Eval Moderate Complexity: 1 Procedure G-Codes:     Gaye AlkenBailey A Aundrey Elahi M.S., OTR/L Pager: [phone]  08/15/2016, 9:19 AM

## 2016-08-15 NOTE — Progress Notes (Signed)
4 Days Post-Op  Subjective: Patient awake, alert Minimal abdominal soreness Patient received one unit PRBC yesterday Hgb increased slightly overnight without any additional transfusions  Objective: Vital signs in last 24 hours: Temp:  [97.7 F (36.5 C)-99.6 F (37.6 C)] 99.2 F (37.3 C) (01/11 0817) Pulse Rate:  [80-119] 100 (01/11 0800) Resp:  [15-28] 18 (01/11 0800) BP: (128-173)/(66-103) 151/83 (01/11 0800) SpO2:  [90 %-100 %] 97 % (01/11 0800) Weight:  [97.8 kg (215 lb 9.8 oz)] 97.8 kg (215 lb 9.8 oz) (01/11 0428) Last BM Date: 08/14/16  Intake/Output from previous day: 01/10 0701 - 01/11 0700 In: 2477.9 [I.V.:1286.7; Blood:1141.3; IV Piggyback:50] Out: 2565 [Urine:2565] Intake/Output this shift: Total I/O In: 150 [I.V.:150] Out: -    Lab Results:   Recent Labs  08/15/16 0142 08/15/16 0644  WBC 9.9 9.1  HGB 7.7* 7.9*  HCT 23.6* 24.2*  PLT 166 184   BMET  Recent Labs  08/14/16 0451 08/15/16 0644  NA 141 142  K 4.6 3.9  CL 107 110  CO2 28 25  GLUCOSE 109* 96  BUN 20 13  CREATININE 1.41* 1.06  CALCIUM 8.3* 8.2*   PT/INR  Recent Labs  08/14/16 1243 08/15/16 0644  LABPROT 14.3 14.6  INR 1.11 1.13   ABG No results for input(s): PHART, HCO3 in the last 72 hours.  Invalid input(s): PCO2, PO2  Studies/Results: Ir Angiogram Visceral Selective  Result Date: 08/14/2016 INDICATION: Known active duodenal ulcer, status post endoscopic epi injection, cautery and endoscopic clipping. Recurrent GI bleeding with acute bright red blood per rectum. EXAM: SELECTIVE VISCERAL ARTERIOGRAPHY; ADDITIONAL ARTERIOGRAPHY; IR ULTRASOUND GUIDANCE VASC ACCESS RIGHT; IR EMBO ART VEN HEMORR LYMPH EXTRAV INC GUIDE ROADMAPPING; ARTERIOGRAPHY MEDICATIONS: None. ANESTHESIA/SEDATION: Moderate (conscious) sedation was employed during this procedure. A total of Versed 6.0 mg and Fentanyl 250 mcg was administered intravenously. Moderate Sedation Time: 70 Minutes. The patient's level  of consciousness and vital signs were monitored continuously by radiology nursing throughout the procedure under my direct supervision. CONTRAST:  50mL ISOVUE-300 IOPAMIDOL (ISOVUE-300) INJECTION 61%, 50mL ISOVUE-300 IOPAMIDOL (ISOVUE-300) INJECTION 61%, 10mL ISOVUE-300 IOPAMIDOL (ISOVUE-300) INJECTION 61% FLUOROSCOPY TIME:  Fluoroscopy Time: 28 minutes 48 seconds (1,118 mGy). COMPLICATIONS: None immediate. PROCEDURE: Informed consent was obtained from the patient following explanation of the procedure, risks, benefits and alternatives. The patient understands, agrees and consents for the procedure. All questions were addressed. A time out was performed prior to the initiation of the procedure. Maximal barrier sterile technique utilized including caps, mask, sterile gowns, sterile gloves, large sterile drape, hand hygiene, and Betadine prep. Previous imaging and endoscopic pictures reviewed. Under sterile conditions and local anesthesia, ultrasound micropuncture access performed of the right common femoral artery. Five French sheath inserted. C2 catheter utilized to select the celiac origin. Selective celiac angiogram performed. Celiac: Celiac origin is widely patent. Splenic and hepatic vasculature are patent. Over a Glidewire, the C2 catheter was advanced into the common hepatic artery. Common hepatic angiogram performed. Common hepatic: Common hepatic, GDA, proper hepatic, right hepatic and left hepatic arteries are all patent. Lantern microcatheter utilized over a fathom guidewire to eventually selected the gastroduodenal artery. Selected GDA angiogram performed. GDA: Gastroduodenal artery is patent as well as the gastroepiploic artery. 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 GDA embolization: Through the microcatheter, 3 mm x 30 mm (2) and 3 mm right 20 mm(1) Ruby micro coils were deployed within the GDA occluding the mid GDA pseudoaneurysm. Following embolization, final angiogram  confirms occlusion of the GDA  with no longer visualization of the pseudoaneurysm. Repeat common hepatic angiogram confirms patency of the hepatic vasculature. Access removed. Hemostasis obtained with a Exoseal device. No immediate complication. Patient tolerated the procedure well. IMPRESSION: Successful celiac, common hepatic, gastroduodenal angiograms. 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 Electronically Signed   By: Judie Petit.  Shick M.D.   On: 08/14/2016 12:47   Ir Angiogram Selective Each Additional Vessel  Result Date: 08/14/2016 INDICATION: Known active duodenal ulcer, status post endoscopic epi injection, cautery and endoscopic clipping. Recurrent GI bleeding with acute bright red blood per rectum. EXAM: SELECTIVE VISCERAL ARTERIOGRAPHY; ADDITIONAL ARTERIOGRAPHY; IR ULTRASOUND GUIDANCE VASC ACCESS RIGHT; IR EMBO ART VEN HEMORR LYMPH EXTRAV INC GUIDE ROADMAPPING; ARTERIOGRAPHY MEDICATIONS: None. ANESTHESIA/SEDATION: Moderate (conscious) sedation was employed during this procedure. A total of Versed 6.0 mg and Fentanyl 250 mcg was administered intravenously. Moderate Sedation Time: 70 Minutes. The patient's level of consciousness and vital signs were monitored continuously by radiology nursing throughout the procedure under my direct supervision. CONTRAST:  50mL ISOVUE-300 IOPAMIDOL (ISOVUE-300) INJECTION 61%, 50mL ISOVUE-300 IOPAMIDOL (ISOVUE-300) INJECTION 61%, 10mL ISOVUE-300 IOPAMIDOL (ISOVUE-300) INJECTION 61% FLUOROSCOPY TIME:  Fluoroscopy Time: 28 minutes 48 seconds (1,118 mGy). COMPLICATIONS: None immediate. PROCEDURE: Informed consent was obtained from the patient following explanation of the procedure, risks, benefits and alternatives. The patient understands, agrees and consents for the procedure. All questions were addressed. A time out was performed prior to the initiation of the procedure. Maximal barrier sterile technique utilized  including caps, mask, sterile gowns, sterile gloves, large sterile drape, hand hygiene, and Betadine prep. Previous imaging and endoscopic pictures reviewed. Under sterile conditions and local anesthesia, ultrasound micropuncture access performed of the right common femoral artery. Five French sheath inserted. C2 catheter utilized to select the celiac origin. Selective celiac angiogram performed. Celiac: Celiac origin is widely patent. Splenic and hepatic vasculature are patent. Over a Glidewire, the C2 catheter was advanced into the common hepatic artery. Common hepatic angiogram performed. Common hepatic: Common hepatic, GDA, proper hepatic, right hepatic and left hepatic arteries are all patent. Lantern microcatheter utilized over a fathom guidewire to eventually selected the gastroduodenal artery. Selected GDA angiogram performed. GDA: Gastroduodenal artery is patent as well as the gastroepiploic artery. 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 GDA embolization: Through the microcatheter, 3 mm x 30 mm (2) and 3 mm right 20 mm(1) Ruby micro coils were deployed within the GDA occluding the mid GDA pseudoaneurysm. Following embolization, final angiogram confirms occlusion of the GDA with no longer visualization of the pseudoaneurysm. Repeat common hepatic angiogram confirms patency of the hepatic vasculature. Access removed. Hemostasis obtained with a Exoseal device. No immediate complication. Patient tolerated the procedure well. IMPRESSION: Successful celiac, common hepatic, gastroduodenal angiograms. 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 Electronically Signed   By: Judie Petit.  Shick M.D.   On: 08/14/2016 12:47   Ir Angiogram Selective Each Additional Vessel  Result Date: 08/14/2016 INDICATION: Known active duodenal ulcer, status post endoscopic epi injection, cautery and endoscopic clipping. Recurrent GI bleeding with acute  bright red blood per rectum. EXAM: SELECTIVE VISCERAL ARTERIOGRAPHY; ADDITIONAL ARTERIOGRAPHY; IR ULTRASOUND GUIDANCE VASC ACCESS RIGHT; IR EMBO ART VEN HEMORR LYMPH EXTRAV INC GUIDE ROADMAPPING; ARTERIOGRAPHY MEDICATIONS: None. ANESTHESIA/SEDATION: Moderate (conscious) sedation was employed during this procedure. A total of Versed 6.0 mg and Fentanyl 250 mcg was administered intravenously. Moderate Sedation Time: 70 Minutes. The patient's level of consciousness and vital signs were  monitored continuously by radiology nursing throughout the procedure under my direct supervision. CONTRAST:  50mL ISOVUE-300 IOPAMIDOL (ISOVUE-300) INJECTION 61%, 50mL ISOVUE-300 IOPAMIDOL (ISOVUE-300) INJECTION 61%, 10mL ISOVUE-300 IOPAMIDOL (ISOVUE-300) INJECTION 61% FLUOROSCOPY TIME:  Fluoroscopy Time: 28 minutes 48 seconds (1,118 mGy). COMPLICATIONS: None immediate. PROCEDURE: Informed consent was obtained from the patient following explanation of the procedure, risks, benefits and alternatives. The patient understands, agrees and consents for the procedure. All questions were addressed. A time out was performed prior to the initiation of the procedure. Maximal barrier sterile technique utilized including caps, mask, sterile gowns, sterile gloves, large sterile drape, hand hygiene, and Betadine prep. Previous imaging and endoscopic pictures reviewed. Under sterile conditions and local anesthesia, ultrasound micropuncture access performed of the right common femoral artery. Five French sheath inserted. C2 catheter utilized to select the celiac origin. Selective celiac angiogram performed. Celiac: Celiac origin is widely patent. Splenic and hepatic vasculature are patent. Over a Glidewire, the C2 catheter was advanced into the common hepatic artery. Common hepatic angiogram performed. Common hepatic: Common hepatic, GDA, proper hepatic, right hepatic and left hepatic arteries are all patent. Lantern microcatheter utilized over a fathom  guidewire to eventually selected the gastroduodenal artery. Selected GDA angiogram performed. GDA: Gastroduodenal artery is patent as well as the gastroepiploic artery. 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 GDA embolization: Through the microcatheter, 3 mm x 30 mm (2) and 3 mm right 20 mm(1) Ruby micro coils were deployed within the GDA occluding the mid GDA pseudoaneurysm. Following embolization, final angiogram confirms occlusion of the GDA with no longer visualization of the pseudoaneurysm. Repeat common hepatic angiogram confirms patency of the hepatic vasculature. Access removed. Hemostasis obtained with a Exoseal device. No immediate complication. Patient tolerated the procedure well. IMPRESSION: Successful celiac, common hepatic, gastroduodenal angiograms. 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 Electronically Signed   By: Judie Petit.  Shick M.D.   On: 08/14/2016 12:47   Ir Angiogram Follow Up Study  Result Date: 08/14/2016 INDICATION: Known active duodenal ulcer, status post endoscopic epi injection, cautery and endoscopic clipping. Recurrent GI bleeding with acute bright red blood per rectum. EXAM: SELECTIVE VISCERAL ARTERIOGRAPHY; ADDITIONAL ARTERIOGRAPHY; IR ULTRASOUND GUIDANCE VASC ACCESS RIGHT; IR EMBO ART VEN HEMORR LYMPH EXTRAV INC GUIDE ROADMAPPING; ARTERIOGRAPHY MEDICATIONS: None. ANESTHESIA/SEDATION: Moderate (conscious) sedation was employed during this procedure. A total of Versed 6.0 mg and Fentanyl 250 mcg was administered intravenously. Moderate Sedation Time: 70 Minutes. The patient's level of consciousness and vital signs were monitored continuously by radiology nursing throughout the procedure under my direct supervision. CONTRAST:  50mL ISOVUE-300 IOPAMIDOL (ISOVUE-300) INJECTION 61%, 50mL ISOVUE-300 IOPAMIDOL (ISOVUE-300) INJECTION 61%, 10mL ISOVUE-300 IOPAMIDOL (ISOVUE-300) INJECTION 61% FLUOROSCOPY  TIME:  Fluoroscopy Time: 28 minutes 48 seconds (1,118 mGy). COMPLICATIONS: None immediate. PROCEDURE: Informed consent was obtained from the patient following explanation of the procedure, risks, benefits and alternatives. The patient understands, agrees and consents for the procedure. All questions were addressed. A time out was performed prior to the initiation of the procedure. Maximal barrier sterile technique utilized including caps, mask, sterile gowns, sterile gloves, large sterile drape, hand hygiene, and Betadine prep. Previous imaging and endoscopic pictures reviewed. Under sterile conditions and local anesthesia, ultrasound micropuncture access performed of the right common femoral artery. Five French sheath inserted. C2 catheter utilized to select the celiac origin. Selective celiac angiogram performed. Celiac: Celiac origin is widely patent. Splenic and hepatic vasculature are patent. Over a Glidewire, the C2 catheter was advanced  into the common hepatic artery. Common hepatic angiogram performed. Common hepatic: Common hepatic, GDA, proper hepatic, right hepatic and left hepatic arteries are all patent. Lantern microcatheter utilized over a fathom guidewire to eventually selected the gastroduodenal artery. Selected GDA angiogram performed. GDA: Gastroduodenal artery is patent as well as the gastroepiploic artery. 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 GDA embolization: Through the microcatheter, 3 mm x 30 mm (2) and 3 mm right 20 mm(1) Ruby micro coils were deployed within the GDA occluding the mid GDA pseudoaneurysm. Following embolization, final angiogram confirms occlusion of the GDA with no longer visualization of the pseudoaneurysm. Repeat common hepatic angiogram confirms patency of the hepatic vasculature. Access removed. Hemostasis obtained with a Exoseal device. No immediate complication. Patient tolerated the procedure well. IMPRESSION: Successful celiac, common  hepatic, gastroduodenal angiograms. 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 Electronically Signed   By: Judie Petit.  Shick M.D.   On: 08/14/2016 12:47   Ir US Guide Vasc Access Right  Result Date: 08/14/2016 INDICATION: Known active duodenal ulcer, status post endoscopic epi injection, cautery and endoscopic clipping. Recurrent GI bleeding with acute bright red blood per rectum. EXAM: SELECTIVE VISCERAL ARTERIOGRAPHY; ADDITIONAL ARTERIOGRAPHY; IR ULTRASOUND GUIDANCE VASC ACCESS RIGHT; IR EMBO ART VEN HEMORR LYMPH EXTRAV INC GUIDE ROADMAPPING; ARTERIOGRAPHY MEDICATIONS: None. ANESTHESIA/SEDATION: Moderate (conscious) sedation was employed during this procedure. A total of Versed 6.0 mg and Fentanyl 250 mcg was administered intravenously. Moderate Sedation Time: 70 Minutes. The patient's level of consciousness and vital signs were monitored continuously by radiology nursing throughout the procedure under my direct supervision. CONTRAST:  50mL ISOVUE-300 IOPAMIDOL (ISOVUE-300) INJECTION 61%, 50mL ISOVUE-300 IOPAMIDOL (ISOVUE-300) INJECTION 61%, 10mL ISOVUE-300 IOPAMIDOL (ISOVUE-300) INJECTION 61% FLUOROSCOPY TIME:  Fluoroscopy Time: 28 minutes 48 seconds (1,118 mGy). COMPLICATIONS: None immediate. PROCEDURE: Informed consent was obtained from the patient following explanation of the procedure, risks, benefits and alternatives. The patient understands, agrees and consents for the procedure. All questions were addressed. A time out was performed prior to the initiation of the procedure. Maximal barrier sterile technique utilized including caps, mask, sterile gowns, sterile gloves, large sterile drape, hand hygiene, and Betadine prep. Previous imaging and endoscopic pictures reviewed. Under sterile conditions and local anesthesia, ultrasound micropuncture access performed of the right common femoral artery. Five French sheath inserted. C2 catheter utilized to  select the celiac origin. Selective celiac angiogram performed. Celiac: Celiac origin is widely patent. Splenic and hepatic vasculature are patent. Over a Glidewire, the C2 catheter was advanced into the common hepatic artery. Common hepatic angiogram performed. Common hepatic: Common hepatic, GDA, proper hepatic, right hepatic and left hepatic arteries are all patent. Lantern microcatheter utilized over a fathom guidewire to eventually selected the gastroduodenal artery. Selected GDA angiogram performed. GDA: Gastroduodenal artery is patent as well as the gastroepiploic artery. 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 GDA embolization: Through the microcatheter, 3 mm x 30 mm (2) and 3 mm right 20 mm(1) Ruby micro coils were deployed within the GDA occluding the mid GDA pseudoaneurysm. Following embolization, final angiogram confirms occlusion of the GDA with no longer visualization of the pseudoaneurysm. Repeat common hepatic angiogram confirms patency of the hepatic vasculature. Access removed. Hemostasis obtained with a Exoseal device. No immediate complication. Patient tolerated the procedure well. IMPRESSION: Successful celiac, common hepatic, gastroduodenal angiograms. 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 Electronically Signed   By: Judie Petit.  Shick M.D.  On: 08/14/2016 12:47   Dg Chest Port 1 View  Result Date: 08/14/2016 CLINICAL DATA:  61 y/o  M; shortness of breath and congestion today. EXAM: PORTABLE CHEST 1 VIEW COMPARISON:  08/12/2016 chest radiograph. FINDINGS: Stable cardiac silhouette within normal limits. Interval development of diffuse hazy opacities of the lungs. Possible trace bilateral pleural effusions. Bones are unremarkable. IMPRESSION: Interval development of diffuse hazy opacities of the lungs probably represents pulmonary edema. Possible trace bilateral pleural effusions. Electronically Signed   By:  Mitzi Hansen M.D.   On: 08/14/2016 14:36   Ir Embo Art  [facility] [employer] Guide Roadmapping  Result Date: 08/14/2016 INDICATION: Known active duodenal ulcer, status post endoscopic epi injection, cautery and endoscopic clipping. Recurrent GI bleeding with acute bright red blood per rectum. EXAM: SELECTIVE VISCERAL ARTERIOGRAPHY; ADDITIONAL ARTERIOGRAPHY; IR ULTRASOUND GUIDANCE VASC ACCESS RIGHT; IR EMBO ART VEN HEMORR LYMPH EXTRAV INC GUIDE ROADMAPPING; ARTERIOGRAPHY MEDICATIONS: None. ANESTHESIA/SEDATION: Moderate (conscious) sedation was employed during this procedure. A total of Versed 6.0 mg and Fentanyl 250 mcg was administered intravenously. Moderate Sedation Time: 70 Minutes. The patient's level of consciousness and vital signs were monitored continuously by radiology nursing throughout the procedure under my direct supervision. CONTRAST:  50mL ISOVUE-300 IOPAMIDOL (ISOVUE-300) INJECTION 61%, 50mL ISOVUE-300 IOPAMIDOL (ISOVUE-300) INJECTION 61%, 10mL ISOVUE-300 IOPAMIDOL (ISOVUE-300) INJECTION 61% FLUOROSCOPY TIME:  Fluoroscopy Time: 28 minutes 48 seconds (1,118 mGy). COMPLICATIONS: None immediate. PROCEDURE: Informed consent was obtained from the patient following explanation of the procedure, risks, benefits and alternatives. The patient understands, agrees and consents for the procedure. All questions were addressed. A time out was performed prior to the initiation of the procedure. Maximal barrier sterile technique utilized including caps, mask, sterile gowns, sterile gloves, large sterile drape, hand hygiene, and Betadine prep. Previous imaging and endoscopic pictures reviewed. Under sterile conditions and local anesthesia, ultrasound micropuncture access performed of the right common femoral artery. Five French sheath inserted. C2 catheter utilized to select the celiac origin. Selective celiac angiogram performed. Celiac: Celiac origin is widely patent. Splenic and hepatic  vasculature are patent. Over a Glidewire, the C2 catheter was advanced into the common hepatic artery. Common hepatic angiogram performed. Common hepatic: Common hepatic, GDA, proper hepatic, right hepatic and left hepatic arteries are all patent. Lantern microcatheter utilized over a fathom guidewire to eventually selected the gastroduodenal artery. Selected GDA angiogram performed. GDA: Gastroduodenal artery is patent as well as the gastroepiploic artery. 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 GDA embolization: Through the microcatheter, 3 mm x 30 mm (2) and 3 mm right 20 mm(1) Ruby micro coils were deployed within the GDA occluding the mid GDA pseudoaneurysm. Following embolization, final angiogram confirms occlusion of the GDA with no longer visualization of the pseudoaneurysm. Repeat common hepatic angiogram confirms patency of the hepatic vasculature. Access removed. Hemostasis obtained with a Exoseal device. No immediate complication. Patient tolerated the procedure well. IMPRESSION: Successful celiac, common hepatic, gastroduodenal angiograms. 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 Electronically Signed   By: Judie Petit.  Shick M.D.   On: 08/14/2016 12:47    Anti-infectives: Anti-infectives    Start     Dose/Rate Route Frequency Ordered Stop   08/14/16 1000  cefTRIAXone (ROCEPHIN) 1 g in dextrose 5 % 50 mL IVPB     1 g 100 mL/hr over 30 Minutes Intravenous Every 24 hours 08/14/16 0918     08/13/16 1100  amoxicillin-clavulanate (AUGMENTIN) 875-125 MG per tablet 1  tablet  Status:  Discontinued     1 tablet Oral Every 12 hours 08/13/16 0947 08/14/16 0918   08/11/16 1600  cefTRIAXone (ROCEPHIN) 1 g in dextrose 5 % 50 mL IVPB  Status:  Discontinued     1 g 100 mL/hr over 30 Minutes Intravenous Every 24 hours 08/10/16 1610 08/11/16 1221   08/11/16 1600  cefTRIAXone (ROCEPHIN) 2 g in dextrose 5 % 50 mL IVPB  Status:   Discontinued     2 g 100 mL/hr over 30 Minutes Intravenous Every 24 hours 08/11/16 1221 08/13/16 0947   08/10/16 1500  cefTRIAXone (ROCEPHIN) 1 g in dextrose 5 % 50 mL IVPB     1 g 100 mL/hr over 30 Minutes Intravenous  Once 08/10/16 1449 08/10/16 1615      Assessment/Plan: 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  Does not appear to have ongoing bleeding.  Wilmon Arms. Corliss Skains, MD, [facility]  General/ Trauma Surgery  08/15/2016 8:19 AM   LOS: 5 days    Carlette Palmatier K. 08/15/2016

## 2016-08-15 NOTE — Clinical Social Work Note (Signed)
Clinical Social Work Assessment  Patient Details  Name: Johnny [name] MRN: [id number] Date of Birth: 1956/04/03  Date of referral:  08/15/16               Reason for consult:  Facility Placement                Permission sought to share information with:  Family Supports Permission granted to share information::  Yes, Verbal Permission Granted  Name::     Sriram Febles (Mother) [phone]  Agency::  Skilled Nursing Facilities  Relationship::     Contact Information:     Housing/Transportation Living arrangements for the past 2 months:  Single Family Home Source of Information:  Patient Patient Interpreter Needed:  None Criminal Activity/Legal Involvement Pertinent to Current Situation/Hospitalization:  No - Comment as needed Significant Relationships:  Other Family Members, Siblings, Parents Lives with:  Parents, Siblings Do you feel safe going back to the place where you live?  Yes Need for family participation in patient care:  Yes (Comment)  Care giving concerns:  Patient lives with his mother who is elderly as well as his brother who appears to also abuse cocaine. Patient with generalized weakness and requiring min assist +2 for short distance functional mobility and mod-max +2 for sit to stand. Pt requiring max-total assist for ADL at this time. Pt presenting with generalized weakness, deconditioning, quick to fatigue, impaired sitting and standing balance, and decreased cognition impacting his independence and safety with ADL and functional mobility. Patient with limited caregiver support in the home.   Social Worker assessment / plan: Pt is a 61 yo manwith PMHx significant for cocaine, ETOH abuse, and bedbugs. No bed bugs seen on this admission. Patient was found down for unknown period of time prior to arrival. CSW engaged with Patient at his bedside. CSW introduced self, role of CSW, and began discussion of SNF placement. CSW provided Patient with bed list of facilities in  [facility]. Patient agreeable to "consider" SNF placement and identifies that he needs physical therapy and reports that he "wants to get better". Patient agreeable to have CSW proceed with SNF placement. CSW to provide Patient with bed offers tomorrow.   CSW also discussed Patient's substance abuse concerns. Patient reports having used cocaine for "a long time" but reports "no more". Patient reports that his admission was a "big wake up call for me". Patient reports that he does drink alcohol and "I like my beer". CSW educated Patient on the effects of cocaine and alcohol on his physical health and how his substance abuse related to his current admission.  CSW provided Patient with substance abuse resources including outpatient, intensive outpatient, and residential treatment. CSW provided Patient with emotional support and brief supportive counseling. CSW continues to follow for disposition and to facilitate discharge to skilled nursing facility once medically appropriate.   Employment status:  Disabled (Comment on whether or not currently receiving Disability) Insurance information:  Medicare PT Recommendations:  Skilled Nursing Facility Information / Referral to community resources:  Residential Substance Abuse Treatment Options, Outpatient Substance Abuse Treatment Options, Skilled Nursing Facility, SBIRT  Patient/Family's Response to care:  Patient appreciative of care received at this time. No concerns identified.   Patient/Family's Understanding of and Emotional Response to Diagnosis, Current Treatment, and Prognosis:  Patient able to verbalize strong understanding of diagnosis, current treatment, and prognosis. Patient reports that he will no longer use cocaine upon discharge. Patient does appear to lack insight on the effects of alcohol  on his physical health and would benefit from further education on the effects of his substance abuse on his health. Patient agreeable to "consider" SNF  placement and identifies that he needs physical therapy and reports that he "wants to get better". Patient agreeable to have CSW proceed with SNF placement.  Emotional Assessment Appearance:  Appears stated age Attitude/Demeanor/Rapport:   (Cooperative; Engaging) Affect (typically observed):  Accepting, Appropriate, Calm, Pleasant, Stable Orientation:  Oriented to Self, Oriented to Place, Oriented to  Time, Oriented to Situation Alcohol / Substance use:  Illicit Drugs, Alcohol Use Psych involvement (Current and /or in the community):  No (Comment)  Discharge Needs  Concerns to be addressed:  Care Coordination, Discharge Planning Concerns Readmission within the last 30 days:  No Current discharge risk:  Substance Abuse Barriers to Discharge:  Continued Medical Work up   [place] Johnice Riebe, LCSW 08/15/2016, 11:35 AM

## 2016-08-15 NOTE — [place] FL2 (Deleted)
[place] MEDICAID FL2 LEVEL OF CARE SCREENING TOOL     IDENTIFICATION  Patient Name: Johnny [name] Birthdate: 10-Apr-1956 Sex: male Admission Date (Current Location): 08/10/2016  [facility] and [place] Number:  Producer, television/film/video and Address:  [facility], [address], [place], [place] [address]      Provider Number: [id number]  Attending Physician Name and Address:  Nelda Bucks, MD  Relative Name and Phone Number:  Allard Lightsey (Mother) [phone]    Current Level of Care: Hospital Recommended Level of Care: Skilled Nursing Facility Prior Approval Number:    Date Approved/Denied:   PASRR Number: [id number]  Discharge Plan: SNF    Current Diagnoses: Patient Active Problem List   Diagnosis Date Noted  . Acute respiratory failure with hypoxia (HCC)   . Hypertension   . ARF (acute renal failure) (HCC)   . Encephalopathy   . Acute duodenal ulcer with hemorrhage   . Ulcer of esophagus without bleeding   . Acute upper GI bleeding   . Gastrointestinal hemorrhage with hematemesis 08/10/2016  . Acute blood loss anemia     Orientation RESPIRATION BLADDER Height & Weight     Self, Time, Situation, Place  Normal Continent, Indwelling catheter Weight: 215 lb 9.8 oz (97.8 kg) Height:     BEHAVIORAL SYMPTOMS/MOOD NEUROLOGICAL BOWEL NUTRITION STATUS   (NONE)   Incontinent Diet (Clear Liquid Diet)  AMBULATORY STATUS COMMUNICATION OF NEEDS Skin   Extensive Assist Verbally Normal                       Personal Care Assistance Level of Assistance  Bathing, Dressing, Feeding Bathing Assistance: Maximum assistance Feeding assistance: Independent Dressing Assistance: Maximum assistance     Functional Limitations Info  Sight, Speech, Hearing Sight Info: Adequate Hearing Info: Adequate Speech Info: Adequate    SPECIAL CARE FACTORS FREQUENCY  PT (By licensed PT), OT (By licensed OT)     PT Frequency: Min 3X/week OT Frequency: Min  2X/week            Contractures Contractures Info: Not present    Additional Factors Info  Code Status, Allergies Code Status Info: FULL Allergies Info: Ketamine           Current Medications (08/15/2016):  This is the current hospital active medication list Current Facility-Administered Medications  Medication Dose Route Frequency Provider Last Rate Last Dose  . 0.9 %  sodium chloride infusion  250 mL Intravenous PRN Nelda Bucks, MD 10 mL/hr at 08/13/16 0155 250 mL at 08/13/16 0155  . cefTRIAXone (ROCEPHIN) 1 g in dextrose 5 % 50 mL IVPB  1 g Intravenous Q24H Alexa R Lawerance Bach, MD   1 g at 08/15/16 0934  . folic acid (FOLVITE) tablet 1 mg  1 mg Oral Daily Alexa R Burns, MD   1 mg at 08/15/16 0934  . hydrALAZINE (APRESOLINE) injection 10-40 mg  10-40 mg Intravenous Q4H PRN Oretha Milch, MD      . lidocaine (PF) (XYLOCAINE) 1 % injection    PRN Berdine Dance, MD   5 mL at 08/14/16 1237  . MEDLINE mouth rinse  15 mL Mouth Rinse BID Nelda Bucks, MD   15 mL at 08/14/16 2200  . ondansetron (ZOFRAN) injection 4 mg  4 mg Intravenous Q6H PRN Su Hoff, MD   4 mg at 08/14/16 0418  . pantoprazole (PROTONIX) 80 mg in sodium chloride 0.9 % 250 mL (0.32 mg/mL)  infusion  8 mg/hr Intravenous Continuous Dianah FieldSarah J Gribbin, PA-C 25 mL/hr at 08/15/16 1000 8 mg/hr at 08/15/16 1000  . [START ON 08/16/2016] pantoprazole (PROTONIX) injection 40 mg  40 mg Intravenous Q12H Lupita Leashouglas B McQuaid, MD      . thiamine (VITAMIN B-1) tablet 100 mg  100 mg Oral Daily Alexa Lucrezia Starch Burns, MD   100 mg at 08/15/16 [id number]     Discharge Medications: Please see discharge summary for a list of discharge medications.  Relevant Imaging Results:  Relevant Lab Results:   Additional Information SS # [id number]  Lew DawesAshley N Jones, LCSW

## 2016-08-15 NOTE — Progress Notes (Signed)
Pt transported to 4E15 with no complications. Belongings placed at bedside. Pt nurse at bedside upon arrival.

## 2016-08-15 NOTE — Progress Notes (Signed)
Paged another GI MD regarding foreign object with no call back. Spoke to CCM intern to make aware of situation. Suzy Bouchardhompson, Leoda Smithhart E, RN 08/15/2016 1:28 AM

## 2016-08-15 NOTE — Progress Notes (Signed)
Daily Rounding Note  08/15/2016, 8:14 AM  LOS: 5 days   SUBJECTIVE:   Chief complaint: hematochezia.  More episodes of small volume bleeding/clots overnight.  Staff reports finding endoclip in some of the blood. Pt c/o pain in LLQ/groin in region of vascular access.  No n/v.  OBJECTIVE:         Vital signs in last 24 hours:    Temp:  [97.7 F (36.5 C)-99.6 F (37.6 C)] 97.7 F (36.5 C) (01/11 0335) Pulse Rate:  [80-119] 103 (01/11 0700) Resp:  [15-28] 19 (01/11 0700) BP: (128-173)/(66-103) 157/92 (01/11 0700) SpO2:  [90 %-100 %] 93 % (01/11 0700) Weight:  [97.8 kg (215 lb 9.8 oz)] 97.8 kg (215 lb 9.8 oz) (01/11 0428) Last BM Date: 08/14/16 Filed Weights   08/13/16 0600 08/14/16 0245 08/15/16 0428  Weight: 102.7 kg (226 lb 6.6 oz) 99.9 kg (220 lb 3.8 oz) 97.8 kg (215 lb 9.8 oz)   General: comfortable, looks tired but well.    Heart: RRR Chest: clear bil.  No dyspnea Abdomen: soft, ND.  Tender in LLQ and with movement.  BS active.  Not distended .  RN showed me the clip which is in a specimen bag Extremities: remnant pedal edema: improved.  Neuro/Psych:  Pleasant, cooperative.  Oriented x 3.  No gross deficits.    Intake/Output from previous day: 01/10 0701 - 01/11 0700 In: 2477.9 [I.V.:1286.7; Blood:1141.3; IV Piggyback:50] Out: 2565 [Urine:2565]  Intake/Output this shift: No intake/output data recorded.  Lab Results:  Recent Labs  08/14/16 2042 08/15/16 0142 08/15/16 0644  WBC 10.2 9.9 9.1  HGB 7.7* 7.7* 7.9*  HCT 23.8* 23.6* 24.2*  PLT 173 166 184   BMET  Recent Labs  08/13/16 0815 08/14/16 0451 08/15/16 0644  NA 142 141 142  K 4.9 4.6 3.9  CL 105 107 110  CO2 30 28 25   GLUCOSE 92 109* 96  BUN 31* 20 13  CREATININE 2.15* 1.41* 1.06  CALCIUM 8.3* 8.3* 8.2*   LFT No results for input(s): PROT, ALBUMIN, AST, ALT, ALKPHOS, BILITOT, BILIDIR, IBILI in the last 72 hours. PT/INR  Recent  Labs  08/14/16 1243 08/15/16 0644  LABPROT 14.3 14.6  INR 1.11 1.13   Hepatitis Panel No results for input(s): HEPBSAG, HCVAB, HEPAIGM, HEPBIGM in the last 72 hours.  Studies/Results: Ir Angiogram Visceral Selective Ir Angiogram Selective Each Additional Vessel Ir Angiogram Selective Each Additional Vessel Ir Angiogram Follow Up Study Ir [place] Guide Vasc Access Right  Result Date: 08/14/2016 INDICATION: Known active duodenal ulcer, status post endoscopic epi injection, cautery and endoscopic clipping. Recurrent GI bleeding with acute bright red blood per rectum. EXAM: SELECTIVE VISCERAL ARTERIOGRAPHY; ADDITIONAL ARTERIOGRAPHY; IR ULTRASOUND GUIDANCE VASC ACCESS RIGHT; IR EMBO ART VEN HEMORR LYMPH EXTRAV INC GUIDE ROADMAPPING; ARTERIOGRAPHY MEDICATIONS: None. ANESTHESIA/SEDATION: Moderate (conscious) sedation was employed during this procedure. A total of Versed 6.0 mg and Fentanyl 250 mcg was administered intravenously. Moderate Sedation Time: 70 Minutes. The patient's level of consciousness and vital signs were monitored continuously by radiology nursing throughout the procedure under my direct supervision. CONTRAST:  50mL ISOVUE-300 IOPAMIDOL (ISOVUE-300) INJECTION 61%, 50mL ISOVUE-300 IOPAMIDOL (ISOVUE-300) INJECTION 61%, 10mL ISOVUE-300 IOPAMIDOL (ISOVUE-300) INJECTION 61% FLUOROSCOPY TIME:  Fluoroscopy Time: 28 minutes 48 seconds (1,118 mGy). COMPLICATIONS: None immediate. PROCEDURE: Informed consent was obtained from the patient following explanation of the procedure, risks, benefits and alternatives. The patient understands, agrees and consents for the procedure. All questions were addressed. A  time out was performed prior to the initiation of the procedure. Maximal barrier sterile technique utilized including caps, mask, sterile gowns, sterile gloves, large sterile drape, hand hygiene, and Betadine prep. Previous imaging and endoscopic pictures reviewed. Under sterile conditions and local  anesthesia, ultrasound micropuncture access performed of the right common femoral artery. Five French sheath inserted. C2 catheter utilized to select the celiac origin. Selective celiac angiogram performed. Celiac: Celiac origin is widely patent. Splenic and hepatic vasculature are patent. Over a Glidewire, the C2 catheter was advanced into the common hepatic artery. Common hepatic angiogram performed. Common hepatic: Common hepatic, GDA, proper hepatic, right hepatic and left hepatic arteries are all patent. Lantern microcatheter utilized over a fathom guidewire to eventually selected the gastroduodenal artery. Selected GDA angiogram performed. GDA: Gastroduodenal artery is patent as well as the gastroepiploic artery. 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 GDA embolization: Through the microcatheter, 3 mm x 30 mm (2) and 3 mm right 20 mm(1) Ruby micro coils were deployed within the GDA occluding the mid GDA pseudoaneurysm. Following embolization, final angiogram confirms occlusion of the GDA with no longer visualization of the pseudoaneurysm. Repeat common hepatic angiogram confirms patency of the hepatic vasculature. Access removed. Hemostasis obtained with a Exoseal device. No immediate complication. Patient tolerated the procedure well. IMPRESSION: Successful celiac, common hepatic, gastroduodenal angiograms. 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 Electronically Signed   By: Judie Petit.  Shick M.D.   On: 08/14/2016 12:47   Dg Chest Port 1 View  Result Date: 08/14/2016 CLINICAL DATA:  61 y/o  M; shortness of breath and congestion today. EXAM: PORTABLE CHEST 1 VIEW COMPARISON:  08/12/2016 chest radiograph. FINDINGS: Stable cardiac silhouette within normal limits. Interval development of diffuse hazy opacities of the lungs. Possible trace bilateral pleural effusions. Bones are unremarkable. IMPRESSION: Interval development of  diffuse hazy opacities of the lungs probably represents pulmonary edema. Possible trace bilateral pleural effusions. Electronically Signed   By: Mitzi Hansen M.D.   On: 08/14/2016 14:36    ASSESMENT:   * UGIB with hematemesis, hematochezia. 08/11/16 EGD: Solitary, non-bleeding DU with VV, bicapped and clipped. Non-bleediing esophageal ulcer. Esophageal nodule, ? NGT trauma.  08/14/16 recurrent bleeding.  IR angiography. s/p coil embolization of GDA pseudoaneurysm.   H Pylori Ab still pending but suspect NSAID induced ulcers. Protonix drip through 1/12 at 0820.  BM on 1/8, none until scant hematemesis followed by hematochezia this AM.  With prodrome of abdominal pain before hematochezia, need to consider ischemic colitis in ddx.    * ABL anemia. S/p PRBC x 7, two on 1/10.  Hgb stable.    * Hypovolemic shock at presentation.  Not present currently.  2 D echo ordered 1/9 yesterday, not completed yet.   * Intubation after found down, now on Venturin mask. LLL infiltrate.  Rocephin >> Augmentin.  CT head normal. EEG diffuse moderate slowing, non-specific diffuse cerebral dysfunction.   Tox screen + for cocaine.    * Foley induced urethral injury per cystoscopy. Gross GU bleeding resolved.    * AKI. Hyperkalemia.  Resolving post resolution of urethral obstruction.  Fortunately no bump in labs post angiography.     PLAN   *  Follow up CBCs, q 12 for now. *  Start clears.  May be able to give solid or full liquids but will d/w Dr Rhea Belton.   Jennye Moccasin  08/15/2016, 8:14 AM Pager: [phone]

## 2016-08-16 LAB — GLUCOSE, CAPILLARY
GLUCOSE-CAPILLARY: 96 mg/dL (ref 65–99)
GLUCOSE-CAPILLARY: 111 mg/dL — AB (ref 65–99)
Glucose-Capillary: 120 mg/dL — ABNORMAL HIGH (ref 65–99)
Glucose-Capillary: 147 mg/dL — ABNORMAL HIGH (ref 65–99)
Glucose-Capillary: 107 mg/dL — ABNORMAL HIGH (ref 65–99)

## 2016-08-16 LAB — BASIC METABOLIC PANEL
ANION GAP: 7 (ref 5–15)
BUN: 10 mg/dL (ref 6–20)
CALCIUM: 8.1 mg/dL — AB (ref 8.9–10.3)
CO2: 26 mmol/L (ref 22–32)
CREATININE: 1.1 mg/dL (ref 0.61–1.24)
Chloride: 107 mmol/L (ref 101–111)
GFR calc Af Amer: >60 mL/min (ref >60)
GFR calc non Af Amer: >60 mL/min (ref >60)
Glucose, Bld: 96 mg/dL (ref 65–99)
Potassium: 3.7 mmol/L (ref 3.5–5.1)
SODIUM: 140 mmol/L (ref 135–145)

## 2016-08-16 LAB — CBC
HCT: 25.8 % — ABNORMAL LOW (ref 39.0–52.0)
Hemoglobin: 8.1 g/dL — ABNORMAL LOW (ref 13.0–17.0)
MCH: 26.6 pg (ref 26.0–34.0)
MCHC: 31.4 g/dL (ref 30.0–36.0)
MCV: 84.9 fL (ref 78.0–100.0)
PLATELETS: 229 10*3/uL (ref 150–400)
RBC: 3.04 MIL/uL — ABNORMAL LOW (ref 4.22–5.81)
RDW: 16.5 % — AB (ref 11.5–15.5)
WBC: 8.4 10*3/uL (ref 4.0–10.5)

## 2016-08-16 LAB — H. PYLORI ANTIBODY, IGG: H PYLORI IGG: 4.6 {index_val} — AB (ref 0.0–0.7)

## 2016-08-16 MED ORDER — SIMETHICONE 80 MG PO CHEW
80.0000 mg | CHEWABLE_TABLET | Freq: Four times a day (QID) | ORAL | Status: DC | PRN
  Administered 2016-08-16: 80 mg via ORAL
  Filled 2016-08-16: qty 1

## 2016-08-16 MED ORDER — PNEUMOCOCCAL VAC POLYVALENT 25 MCG/0.5ML IJ INJ
0.5000 mL | INJECTION | INTRAMUSCULAR | Status: AC
  Administered 2016-08-17: 0.5 mL via INTRAMUSCULAR
  Filled 2016-08-16: qty 0.5

## 2016-08-16 MED ORDER — INFLUENZA VAC SPLIT QUAD 0.5 ML IM SUSY
0.5000 mL | PREFILLED_SYRINGE | INTRAMUSCULAR | Status: AC
  Administered 2016-08-17: 0.5 mL via INTRAMUSCULAR
  Filled 2016-08-16: qty 0.5

## 2016-08-16 NOTE — Progress Notes (Signed)
Patient ID: Johnny [name], male   DOB: 08/01/1956, 61 y.o.   MRN: [id number]  [facility] Surgery Progress Note  5 Days Post-Op  Subjective: Patient reports gas pains this morning. He has not had a BM. Denies bloody emesis, nose bleeds, or any other signs of bleeding.  Objective: Vital signs in last 24 hours: Temp:  [98.4 F (36.9 C)-100 F (37.8 C)] 98.4 F (36.9 C) (01/12 0751) Pulse Rate:  [79-115] 94 (01/12 0751) Resp:  [15-28] 15 (01/12 0751) BP: (129-171)/(70-101) 149/85 (01/12 0751) SpO2:  [91 %-100 %] 97 % (01/12 0751) Weight:  [209 lb 3.5 oz (94.9 kg)-212 lb 11.9 oz (96.5 kg)] 212 lb 11.9 oz (96.5 kg) (01/12 0425) Last BM Date: 08/15/16  Intake/Output from previous day: 01/11 0701 - 01/12 0700 In: 1180 [P.O.:480; I.V.:650; IV Piggyback:50] Out: 4125 [Urine:4125] Intake/Output this shift: No intake/output data recorded.  PE: Gen:  Alert, NAD, pleasant Card:  RRR, no M/G/R heard Pulm:  CTAB, no W/R/R, effort normal Abd: Soft, ND, mild lower abdominal tenderness, no HSM   Lab Results:   Recent Labs  08/15/16 0644 08/15/16 1647  WBC 9.1 8.7  HGB 7.9* 8.1*  HCT 24.2* 24.6*  PLT 184 169   BMET  Recent Labs  08/15/16 0644 08/16/16 0446  NA 142 140  K 3.9 3.7  CL 110 107  CO2 25 26  GLUCOSE 96 96  BUN 13 10  CREATININE 1.06 1.10  CALCIUM 8.2* 8.1*   PT/INR  Recent Labs  08/14/16 1243 08/15/16 0644  LABPROT 14.3 14.6  INR 1.11 1.13   CMP     Component Value Date/Time   NA 140 08/16/2016 0446   K 3.7 08/16/2016 0446   CL 107 08/16/2016 0446   CO2 26 08/16/2016 0446   GLUCOSE 96 08/16/2016 0446   BUN 10 08/16/2016 0446   CREATININE 1.10 08/16/2016 0446   CALCIUM 8.1 (L) 08/16/2016 0446   PROT 5.4 (L) 08/10/2016 1700   ALBUMIN 2.6 (L) 08/10/2016 1700   AST 24 08/10/2016 1700   ALT 18 08/10/2016 1700   ALKPHOS 57 08/10/2016 1700   BILITOT 0.2 (L) 08/10/2016 1700   GFRNONAA >60 08/16/2016 0446   GFRAA >60 08/16/2016 0446    Lipase     Component Value Date/Time   LIPASE 18 08/10/2016 1700       Studies/Results: Ir Angiogram Visceral Selective  Result Date: 08/14/2016 INDICATION: Known active duodenal ulcer, status post endoscopic epi injection, cautery and endoscopic clipping. Recurrent GI bleeding with acute bright red blood per rectum. EXAM: SELECTIVE VISCERAL ARTERIOGRAPHY; ADDITIONAL ARTERIOGRAPHY; IR ULTRASOUND GUIDANCE VASC ACCESS RIGHT; IR EMBO ART VEN HEMORR LYMPH EXTRAV INC GUIDE ROADMAPPING; ARTERIOGRAPHY MEDICATIONS: None. ANESTHESIA/SEDATION: Moderate (conscious) sedation was employed during this procedure. A total of Versed 6.0 mg and Fentanyl 250 mcg was administered intravenously. Moderate Sedation Time: 70 Minutes. The patient's level of consciousness and vital signs were monitored continuously by radiology nursing throughout the procedure under my direct supervision. CONTRAST:  50mL ISOVUE-300 IOPAMIDOL (ISOVUE-300) INJECTION 61%, 50mL ISOVUE-300 IOPAMIDOL (ISOVUE-300) INJECTION 61%, 10mL ISOVUE-300 IOPAMIDOL (ISOVUE-300) INJECTION 61% FLUOROSCOPY TIME:  Fluoroscopy Time: 28 minutes 48 seconds (1,118 mGy). COMPLICATIONS: None immediate. PROCEDURE: Informed consent was obtained from the patient following explanation of the procedure, risks, benefits and alternatives. The patient understands, agrees and consents for the procedure. All questions were addressed. A time out was performed prior to the initiation of the procedure. Maximal barrier sterile technique utilized including caps, mask, sterile gowns, sterile gloves, large  sterile drape, hand hygiene, and Betadine prep. Previous imaging and endoscopic pictures reviewed. Under sterile conditions and local anesthesia, ultrasound micropuncture access performed of the right common femoral artery. Five French sheath inserted. C2 catheter utilized to select the celiac origin. Selective celiac angiogram performed. Celiac: Celiac origin is widely patent.  Splenic and hepatic vasculature are patent. Over a Glidewire, the C2 catheter was advanced into the common hepatic artery. Common hepatic angiogram performed. Common hepatic: Common hepatic, GDA, proper hepatic, right hepatic and left hepatic arteries are all patent. Lantern microcatheter utilized over a fathom guidewire to eventually selected the gastroduodenal artery. Selected GDA angiogram performed. GDA: Gastroduodenal artery is patent as well as the gastroepiploic artery. 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 GDA embolization: Through the microcatheter, 3 mm x 30 mm (2) and 3 mm right 20 mm(1) Ruby micro coils were deployed within the GDA occluding the mid GDA pseudoaneurysm. Following embolization, final angiogram confirms occlusion of the GDA with no longer visualization of the pseudoaneurysm. Repeat common hepatic angiogram confirms patency of the hepatic vasculature. Access removed. Hemostasis obtained with a Exoseal device. No immediate complication. Patient tolerated the procedure well. IMPRESSION: Successful celiac, common hepatic, gastroduodenal angiograms. 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 Electronically Signed   By: Judie Petit.  Shick M.D.   On: 08/14/2016 12:47   Ir Angiogram Selective Each Additional Vessel  Result Date: 08/14/2016 INDICATION: Known active duodenal ulcer, status post endoscopic epi injection, cautery and endoscopic clipping. Recurrent GI bleeding with acute bright red blood per rectum. EXAM: SELECTIVE VISCERAL ARTERIOGRAPHY; ADDITIONAL ARTERIOGRAPHY; IR ULTRASOUND GUIDANCE VASC ACCESS RIGHT; IR EMBO ART VEN HEMORR LYMPH EXTRAV INC GUIDE ROADMAPPING; ARTERIOGRAPHY MEDICATIONS: None. ANESTHESIA/SEDATION: Moderate (conscious) sedation was employed during this procedure. A total of Versed 6.0 mg and Fentanyl 250 mcg was administered intravenously. Moderate Sedation Time: 70 Minutes. The  patient's level of consciousness and vital signs were monitored continuously by radiology nursing throughout the procedure under my direct supervision. CONTRAST:  50mL ISOVUE-300 IOPAMIDOL (ISOVUE-300) INJECTION 61%, 50mL ISOVUE-300 IOPAMIDOL (ISOVUE-300) INJECTION 61%, 10mL ISOVUE-300 IOPAMIDOL (ISOVUE-300) INJECTION 61% FLUOROSCOPY TIME:  Fluoroscopy Time: 28 minutes 48 seconds (1,118 mGy). COMPLICATIONS: None immediate. PROCEDURE: Informed consent was obtained from the patient following explanation of the procedure, risks, benefits and alternatives. The patient understands, agrees and consents for the procedure. All questions were addressed. A time out was performed prior to the initiation of the procedure. Maximal barrier sterile technique utilized including caps, mask, sterile gowns, sterile gloves, large sterile drape, hand hygiene, and Betadine prep. Previous imaging and endoscopic pictures reviewed. Under sterile conditions and local anesthesia, ultrasound micropuncture access performed of the right common femoral artery. Five French sheath inserted. C2 catheter utilized to select the celiac origin. Selective celiac angiogram performed. Celiac: Celiac origin is widely patent. Splenic and hepatic vasculature are patent. Over a Glidewire, the C2 catheter was advanced into the common hepatic artery. Common hepatic angiogram performed. Common hepatic: Common hepatic, GDA, proper hepatic, right hepatic and left hepatic arteries are all patent. Lantern microcatheter utilized over a fathom guidewire to eventually selected the gastroduodenal artery. Selected GDA angiogram performed. GDA: Gastroduodenal artery is patent as well as the gastroepiploic artery. 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 GDA embolization: Through the microcatheter, 3 mm x 30 mm (2) and 3 mm right 20 mm(1) Ruby micro coils were deployed within the GDA occluding the mid GDA pseudoaneurysm. Following embolization,  final angiogram confirms occlusion  of the GDA with no longer visualization of the pseudoaneurysm. Repeat common hepatic angiogram confirms patency of the hepatic vasculature. Access removed. Hemostasis obtained with a Exoseal device. No immediate complication. Patient tolerated the procedure well. IMPRESSION: Successful celiac, common hepatic, gastroduodenal angiograms. 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 Electronically Signed   By: Judie Petit.  Shick M.D.   On: 08/14/2016 12:47   Ir Angiogram Selective Each Additional Vessel  Result Date: 08/14/2016 INDICATION: Known active duodenal ulcer, status post endoscopic epi injection, cautery and endoscopic clipping. Recurrent GI bleeding with acute bright red blood per rectum. EXAM: SELECTIVE VISCERAL ARTERIOGRAPHY; ADDITIONAL ARTERIOGRAPHY; IR ULTRASOUND GUIDANCE VASC ACCESS RIGHT; IR EMBO ART VEN HEMORR LYMPH EXTRAV INC GUIDE ROADMAPPING; ARTERIOGRAPHY MEDICATIONS: None. ANESTHESIA/SEDATION: Moderate (conscious) sedation was employed during this procedure. A total of Versed 6.0 mg and Fentanyl 250 mcg was administered intravenously. Moderate Sedation Time: 70 Minutes. The patient's level of consciousness and vital signs were monitored continuously by radiology nursing throughout the procedure under my direct supervision. CONTRAST:  50mL ISOVUE-300 IOPAMIDOL (ISOVUE-300) INJECTION 61%, 50mL ISOVUE-300 IOPAMIDOL (ISOVUE-300) INJECTION 61%, 10mL ISOVUE-300 IOPAMIDOL (ISOVUE-300) INJECTION 61% FLUOROSCOPY TIME:  Fluoroscopy Time: 28 minutes 48 seconds (1,118 mGy). COMPLICATIONS: None immediate. PROCEDURE: Informed consent was obtained from the patient following explanation of the procedure, risks, benefits and alternatives. The patient understands, agrees and consents for the procedure. All questions were addressed. A time out was performed prior to the initiation of the procedure. Maximal barrier sterile  technique utilized including caps, mask, sterile gowns, sterile gloves, large sterile drape, hand hygiene, and Betadine prep. Previous imaging and endoscopic pictures reviewed. Under sterile conditions and local anesthesia, ultrasound micropuncture access performed of the right common femoral artery. Five French sheath inserted. C2 catheter utilized to select the celiac origin. Selective celiac angiogram performed. Celiac: Celiac origin is widely patent. Splenic and hepatic vasculature are patent. Over a Glidewire, the C2 catheter was advanced into the common hepatic artery. Common hepatic angiogram performed. Common hepatic: Common hepatic, GDA, proper hepatic, right hepatic and left hepatic arteries are all patent. Lantern microcatheter utilized over a fathom guidewire to eventually selected the gastroduodenal artery. Selected GDA angiogram performed. GDA: Gastroduodenal artery is patent as well as the gastroepiploic artery. 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 GDA embolization: Through the microcatheter, 3 mm x 30 mm (2) and 3 mm right 20 mm(1) Ruby micro coils were deployed within the GDA occluding the mid GDA pseudoaneurysm. Following embolization, final angiogram confirms occlusion of the GDA with no longer visualization of the pseudoaneurysm. Repeat common hepatic angiogram confirms patency of the hepatic vasculature. Access removed. Hemostasis obtained with a Exoseal device. No immediate complication. Patient tolerated the procedure well. IMPRESSION: Successful celiac, common hepatic, gastroduodenal angiograms. 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 Electronically Signed   By: Judie Petit.  Shick M.D.   On: 08/14/2016 12:47   Ir Angiogram Follow Up Study  Result Date: 08/14/2016 INDICATION: Known active duodenal ulcer, status post endoscopic epi injection, cautery and endoscopic clipping. Recurrent GI bleeding with acute  bright red blood per rectum. EXAM: SELECTIVE VISCERAL ARTERIOGRAPHY; ADDITIONAL ARTERIOGRAPHY; IR ULTRASOUND GUIDANCE VASC ACCESS RIGHT; IR EMBO ART VEN HEMORR LYMPH EXTRAV INC GUIDE ROADMAPPING; ARTERIOGRAPHY MEDICATIONS: None. ANESTHESIA/SEDATION: Moderate (conscious) sedation was employed during this procedure. A total of Versed 6.0 mg and Fentanyl 250 mcg was administered intravenously. Moderate Sedation Time: 70 Minutes. The patient's level of consciousness and vital  signs were monitored continuously by radiology nursing throughout the procedure under my direct supervision. CONTRAST:  50mL ISOVUE-300 IOPAMIDOL (ISOVUE-300) INJECTION 61%, 50mL ISOVUE-300 IOPAMIDOL (ISOVUE-300) INJECTION 61%, 10mL ISOVUE-300 IOPAMIDOL (ISOVUE-300) INJECTION 61% FLUOROSCOPY TIME:  Fluoroscopy Time: 28 minutes 48 seconds (1,118 mGy). COMPLICATIONS: None immediate. PROCEDURE: Informed consent was obtained from the patient following explanation of the procedure, risks, benefits and alternatives. The patient understands, agrees and consents for the procedure. All questions were addressed. A time out was performed prior to the initiation of the procedure. Maximal barrier sterile technique utilized including caps, mask, sterile gowns, sterile gloves, large sterile drape, hand hygiene, and Betadine prep. Previous imaging and endoscopic pictures reviewed. Under sterile conditions and local anesthesia, ultrasound micropuncture access performed of the right common femoral artery. Five French sheath inserted. C2 catheter utilized to select the celiac origin. Selective celiac angiogram performed. Celiac: Celiac origin is widely patent. Splenic and hepatic vasculature are patent. Over a Glidewire, the C2 catheter was advanced into the common hepatic artery. Common hepatic angiogram performed. Common hepatic: Common hepatic, GDA, proper hepatic, right hepatic and left hepatic arteries are all patent. Lantern microcatheter utilized over a fathom  guidewire to eventually selected the gastroduodenal artery. Selected GDA angiogram performed. GDA: Gastroduodenal artery is patent as well as the gastroepiploic artery. 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 GDA embolization: Through the microcatheter, 3 mm x 30 mm (2) and 3 mm right 20 mm(1) Ruby micro coils were deployed within the GDA occluding the mid GDA pseudoaneurysm. Following embolization, final angiogram confirms occlusion of the GDA with no longer visualization of the pseudoaneurysm. Repeat common hepatic angiogram confirms patency of the hepatic vasculature. Access removed. Hemostasis obtained with a Exoseal device. No immediate complication. Patient tolerated the procedure well. IMPRESSION: Successful celiac, common hepatic, gastroduodenal angiograms. 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 Electronically Signed   By: Judie Petit.  Shick M.D.   On: 08/14/2016 12:47   Ir US Guide Vasc Access Right  Result Date: 08/14/2016 INDICATION: Known active duodenal ulcer, status post endoscopic epi injection, cautery and endoscopic clipping. Recurrent GI bleeding with acute bright red blood per rectum. EXAM: SELECTIVE VISCERAL ARTERIOGRAPHY; ADDITIONAL ARTERIOGRAPHY; IR ULTRASOUND GUIDANCE VASC ACCESS RIGHT; IR EMBO ART VEN HEMORR LYMPH EXTRAV INC GUIDE ROADMAPPING; ARTERIOGRAPHY MEDICATIONS: None. ANESTHESIA/SEDATION: Moderate (conscious) sedation was employed during this procedure. A total of Versed 6.0 mg and Fentanyl 250 mcg was administered intravenously. Moderate Sedation Time: 70 Minutes. The patient's level of consciousness and vital signs were monitored continuously by radiology nursing throughout the procedure under my direct supervision. CONTRAST:  50mL ISOVUE-300 IOPAMIDOL (ISOVUE-300) INJECTION 61%, 50mL ISOVUE-300 IOPAMIDOL (ISOVUE-300) INJECTION 61%, 10mL ISOVUE-300 IOPAMIDOL (ISOVUE-300) INJECTION 61% FLUOROSCOPY  TIME:  Fluoroscopy Time: 28 minutes 48 seconds (1,118 mGy). COMPLICATIONS: None immediate. PROCEDURE: Informed consent was obtained from the patient following explanation of the procedure, risks, benefits and alternatives. The patient understands, agrees and consents for the procedure. All questions were addressed. A time out was performed prior to the initiation of the procedure. Maximal barrier sterile technique utilized including caps, mask, sterile gowns, sterile gloves, large sterile drape, hand hygiene, and Betadine prep. Previous imaging and endoscopic pictures reviewed. Under sterile conditions and local anesthesia, ultrasound micropuncture access performed of the right common femoral artery. Five French sheath inserted. C2 catheter utilized to select the celiac origin. Selective celiac angiogram performed. Celiac: Celiac origin is widely patent. Splenic and hepatic vasculature are patent. Over a Glidewire, the C2  catheter was advanced into the common hepatic artery. Common hepatic angiogram performed. Common hepatic: Common hepatic, GDA, proper hepatic, right hepatic and left hepatic arteries are all patent. Lantern microcatheter utilized over a fathom guidewire to eventually selected the gastroduodenal artery. Selected GDA angiogram performed. GDA: Gastroduodenal artery is patent as well as the gastroepiploic artery. 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 GDA embolization: Through the microcatheter, 3 mm x 30 mm (2) and 3 mm right 20 mm(1) Ruby micro coils were deployed within the GDA occluding the mid GDA pseudoaneurysm. Following embolization, final angiogram confirms occlusion of the GDA with no longer visualization of the pseudoaneurysm. Repeat common hepatic angiogram confirms patency of the hepatic vasculature. Access removed. Hemostasis obtained with a Exoseal device. No immediate complication. Patient tolerated the procedure well. IMPRESSION: Successful celiac, common  hepatic, gastroduodenal angiograms. 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 Electronically Signed   By: Judie Petit.  Shick M.D.   On: 08/14/2016 12:47   Dg Chest Port 1 View  Result Date: 08/14/2016 CLINICAL DATA:  61 y/o  M; shortness of breath and congestion today. EXAM: PORTABLE CHEST 1 VIEW COMPARISON:  08/12/2016 chest radiograph. FINDINGS: Stable cardiac silhouette within normal limits. Interval development of diffuse hazy opacities of the lungs. Possible trace bilateral pleural effusions. Bones are unremarkable. IMPRESSION: Interval development of diffuse hazy opacities of the lungs probably represents pulmonary edema. Possible trace bilateral pleural effusions. Electronically Signed   By: Mitzi Hansen M.D.   On: 08/14/2016 14:36   Ir Embo Art  [facility] [employer] Guide Roadmapping  Result Date: 08/14/2016 INDICATION: Known active duodenal ulcer, status post endoscopic epi injection, cautery and endoscopic clipping. Recurrent GI bleeding with acute bright red blood per rectum. EXAM: SELECTIVE VISCERAL ARTERIOGRAPHY; ADDITIONAL ARTERIOGRAPHY; IR ULTRASOUND GUIDANCE VASC ACCESS RIGHT; IR EMBO ART VEN HEMORR LYMPH EXTRAV INC GUIDE ROADMAPPING; ARTERIOGRAPHY MEDICATIONS: None. ANESTHESIA/SEDATION: Moderate (conscious) sedation was employed during this procedure. A total of Versed 6.0 mg and Fentanyl 250 mcg was administered intravenously. Moderate Sedation Time: 70 Minutes. The patient's level of consciousness and vital signs were monitored continuously by radiology nursing throughout the procedure under my direct supervision. CONTRAST:  50mL ISOVUE-300 IOPAMIDOL (ISOVUE-300) INJECTION 61%, 50mL ISOVUE-300 IOPAMIDOL (ISOVUE-300) INJECTION 61%, 10mL ISOVUE-300 IOPAMIDOL (ISOVUE-300) INJECTION 61% FLUOROSCOPY TIME:  Fluoroscopy Time: 28 minutes 48 seconds (1,118 mGy). COMPLICATIONS: None immediate. PROCEDURE: Informed consent was  obtained from the patient following explanation of the procedure, risks, benefits and alternatives. The patient understands, agrees and consents for the procedure. All questions were addressed. A time out was performed prior to the initiation of the procedure. Maximal barrier sterile technique utilized including caps, mask, sterile gowns, sterile gloves, large sterile drape, hand hygiene, and Betadine prep. Previous imaging and endoscopic pictures reviewed. Under sterile conditions and local anesthesia, ultrasound micropuncture access performed of the right common femoral artery. Five French sheath inserted. C2 catheter utilized to select the celiac origin. Selective celiac angiogram performed. Celiac: Celiac origin is widely patent. Splenic and hepatic vasculature are patent. Over a Glidewire, the C2 catheter was advanced into the common hepatic artery. Common hepatic angiogram performed. Common hepatic: Common hepatic, GDA, proper hepatic, right hepatic and left hepatic arteries are all patent. Lantern microcatheter utilized over a fathom guidewire to eventually selected the gastroduodenal artery. Selected GDA angiogram performed. GDA: Gastroduodenal artery is patent as well as the gastroepiploic artery. 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 GDA embolization:  Through the microcatheter, 3 mm x 30 mm (2) and 3 mm right 20 mm(1) Ruby micro coils were deployed within the GDA occluding the mid GDA pseudoaneurysm. Following embolization, final angiogram confirms occlusion of the GDA with no longer visualization of the pseudoaneurysm. Repeat common hepatic angiogram confirms patency of the hepatic vasculature. Access removed. Hemostasis obtained with a Exoseal device. No immediate complication. Patient tolerated the procedure well. IMPRESSION: Successful celiac, common hepatic, gastroduodenal angiograms. 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 Electronically Signed   By: Judie PetitM.  Shick M.D.   On: 08/14/2016 12:47    Anti-infectives: Anti-infectives    Start     Dose/Rate Route Frequency Ordered Stop   08/14/16 1000  cefTRIAXone (ROCEPHIN) 1 g in dextrose 5 % 50 mL IVPB     1 g 100 mL/hr over 30 Minutes Intravenous Every 24 hours 08/14/16 0918 08/17/16 0959   08/13/16 1100  amoxicillin-clavulanate (AUGMENTIN) 875-125 MG per tablet 1 tablet  Status:  Discontinued     1 tablet Oral Every 12 hours 08/13/16 0947 08/14/16 0918   08/11/16 1600  cefTRIAXone (ROCEPHIN) 1 g in dextrose 5 % 50 mL IVPB  Status:  Discontinued     1 g 100 mL/hr over 30 Minutes Intravenous Every 24 hours 08/10/16 1610 08/11/16 1221   08/11/16 1600  cefTRIAXone (ROCEPHIN) 2 g in dextrose 5 % 50 mL IVPB  Status:  Discontinued     2 g 100 mL/hr over 30 Minutes Intravenous Every 24 hours 08/11/16 1221 08/13/16 0947   08/10/16 1500  cefTRIAXone (ROCEPHIN) 1 g in dextrose 5 % 50 mL IVPB     1 g 100 mL/hr over 30 Minutes Intravenous  Once 08/10/16 1449 08/10/16 1615       Assessment/Plan Upper GI bleed, duodenal ulcer with hemorrhage - s/p EGD 1/7 with clipping/injection - s/p IR angioembolization of GDA pseudoaneurysm 1/10 - labs pending  Plan - No signs of bleeding. H&H pending. If stable general surgery will sign off, but please call back with any concerns.   LOS: 6 days    Edson SnowballBROOKE A MILLER , [facility] 08/16/2016, 8:36 AM Pager: [phone] Consults: [phone] Mon-Fri 7:00 am-4:30 pm Sat-Sun 7:00 am-11:30 am

## 2016-08-16 NOTE — Progress Notes (Addendum)
[facility] PROGRESS NOTE  Johnny [name] WJX:[id number] DOB: Sep 08, 1955 DOA: 08/10/2016  PCP: [facility]  Brief History/Interval Summary: 61 yr old with PMHx of drug abuse with cocaine and etoh presented on 1/6 was found down with hematochezia and found to have an UGIB 2/2 bleeding duodenal ulcer s/p coil embolization of GDA.   Reason for Visit: Upper GI bleed  Consultants: Gastroenterology. General surgery. Interventional radiology. Critical care medicine  SIGNIFICANT EVENTS: 1/6- GI bleed, change in MS, ett 1/7 > EGD revealed duodenal ulcer 1/8 > ETT removed, new foley placed 2/2 urethral injury 1/10 > BRBPR, underwent coil embolization of GDA  Transthoracic echocardiogram Study Conclusions  - Left ventricle: The cavity size was normal. Systolic function was   normal. The estimated ejection fraction was in the range of 55%   to 60%. Wall motion was normal; there were no regional wall   motion abnormalities. Left ventricular diastolic function   parameters were normal.  Antibiotics: Currently on ceftriaxone  Subjective/Interval History: Patient feels well this morning. Hasn't had any bowel movements. Denies any dizziness or lightheadedness. Denies any abdominal pain, nausea or vomiting.  ROS: No chest pain or shortness of breath  Objective:  Vital Signs  Vitals:   08/16/16 0336 08/16/16 0425 08/16/16 0751 08/16/16 1100  BP: (!) 164/85  (!) 149/85 (!) 157/87  Pulse:   94 88  Resp:   15 17  Temp: 98.5 F (36.9 C)  98.4 F (36.9 C) 98.4 F (36.9 C)  TempSrc: Oral  Oral Oral  SpO2:   97% 99%  Weight:  96.5 kg (212 lb 11.9 oz)    Height:        Intake/Output Summary (Last 24 hours) at 08/16/16 1345 Last data filed at 08/16/16 1100  Gross per 24 hour  Intake              855 ml  Output             3775 ml  Net            -2920 ml   Filed Weights   08/15/16 0428 08/15/16 2125 08/16/16 0425  Weight: 97.8 kg (215 lb 9.8 oz) 94.9 kg  (209 lb 3.5 oz) 96.5 kg (212 lb 11.9 oz)    General appearance: alert, cooperative, appears stated age and no distress Resp: clear to auscultation bilaterally Cardio: regular rate and rhythm, S1, S2 normal, no murmur, click, rub or gallop GI: soft, non-tender; bowel sounds normal; no masses,  no organomegaly Extremities: Minimal edema bilateral lower extremities Neurologic: Awake and alert. Oriented 3. No focal neurological deficits. Gen. deconditioning is noted.  Lab Results:  Data Reviewed: I have personally reviewed following labs and imaging studies  CBC:  Recent Labs Lab 08/10/16 1455  08/14/16 2042 08/15/16 0142 08/15/16 0644 08/15/16 1647 08/16/16 0851  WBC 11.6*  < > 10.2 9.9 9.1 8.7 8.4  NEUTROABS 8.6*  --   --   --   --   --   --   HGB 6.3*  < > 7.7* 7.7* 7.9* 8.1* 8.1*  HCT 20.0*  < > 23.8* 23.6* 24.2* 24.6* 25.8*  MCV 81.3  < > 84.4 84.6 84.3 85.4 84.9  PLT 295  < > 173 166 184 169 229  < > = values in this interval not displayed.  Basic Metabolic Panel:  Recent Labs Lab 08/12/16 1052 08/13/16 0815 08/14/16 0451 08/15/16 0644 08/16/16 0446  NA 136 142 141 142 140  K 5.2* 4.9 4.6 3.9 3.7  CL 100* 105 107 110 107  CO2 29 30 28 25 26   GLUCOSE 142* 92 109* 96 96  BUN 44* 31* 20 13 10   CREATININE 3.80* 2.15* 1.41* 1.06 1.10  CALCIUM 8.1* 8.3* 8.3* 8.2* 8.1*    GFR: Estimated Creatinine Clearance: 85.2 mL/min (by C-G formula based on SCr of 1.1 mg/dL).  Liver Function Tests:  Recent Labs Lab 08/10/16 1455 08/10/16 1700  AST 28 24  ALT 18 18  ALKPHOS 53 57  BILITOT <0.1* 0.2*  PROT 5.4* 5.4*  ALBUMIN 2.7* 2.6*     Recent Labs Lab 08/10/16 1455 08/10/16 1700  LIPASE 19 18  AMYLASE  --  56    Recent Labs Lab 08/10/16 1700  AMMONIA 37*    Coagulation Profile:  Recent Labs Lab 08/10/16 1455 08/10/16 1700 08/14/16 0918 08/14/16 1243 08/15/16 0644  INR 1.10 1.16 1.15 1.11 1.13    CBG:  Recent Labs Lab 08/15/16 2004  08/15/16 2327 08/16/16 0334 08/16/16 0805 08/16/16 1139  GLUCAP 120* 102* 96 120* 111*    Lipid Profile:  Recent Labs  08/13/16 1519  TRIG 101     Recent Results (from the past 240 hour(s))  Blood culture (routine x 2)     Status: Abnormal   Collection Time: 08/10/16  3:30 PM  Result Value Ref Range Status   Specimen Description BLOOD RIGHT ANTECUBITAL  Final   Special Requests BOTTLES DRAWN AEROBIC AND ANAEROBIC 5 CC  Final   Culture  Setup Time   Final    GRAM POSITIVE RODS AEROBIC BOTTLE ONLY CRITICAL RESULT CALLED TO, READ BACK BY AND VERIFIED WITH: ADagoberto Ligas.D. 10:20 08/13/16 ([email or web address])    Culture (A)  Final    DIPHTHEROIDS(CORYNEBACTERIUM SPECIES) Standardized susceptibility testing for this organism is not available.    Report Status 08/14/2016 FINAL  Final  Blood Culture ID Panel (Reflexed)     Status: None   Collection Time: 08/10/16  3:30 PM  Result Value Ref Range Status   Enterococcus species NOT DETECTED NOT DETECTED Final   Listeria monocytogenes NOT DETECTED NOT DETECTED Final   Staphylococcus species NOT DETECTED NOT DETECTED Final   Staphylococcus aureus NOT DETECTED NOT DETECTED Final   Streptococcus species NOT DETECTED NOT DETECTED Final   Streptococcus agalactiae NOT DETECTED NOT DETECTED Final   Streptococcus pneumoniae NOT DETECTED NOT DETECTED Final   Streptococcus pyogenes NOT DETECTED NOT DETECTED Final   Acinetobacter baumannii NOT DETECTED NOT DETECTED Final   Enterobacteriaceae species NOT DETECTED NOT DETECTED Final   Enterobacter cloacae complex NOT DETECTED NOT DETECTED Final   Escherichia coli NOT DETECTED NOT DETECTED Final   Klebsiella oxytoca NOT DETECTED NOT DETECTED Final   Klebsiella pneumoniae NOT DETECTED NOT DETECTED Final   Proteus species NOT DETECTED NOT DETECTED Final   Serratia marcescens NOT DETECTED NOT DETECTED Final   Haemophilus influenzae NOT DETECTED NOT DETECTED Final   Neisseria meningitidis NOT DETECTED  NOT DETECTED Final   Pseudomonas aeruginosa NOT DETECTED NOT DETECTED Final   Candida albicans NOT DETECTED NOT DETECTED Final   Candida glabrata NOT DETECTED NOT DETECTED Final   Candida krusei NOT DETECTED NOT DETECTED Final   Candida parapsilosis NOT DETECTED NOT DETECTED Final   Candida tropicalis NOT DETECTED NOT DETECTED Final  Culture, blood (Routine X 2) w Reflex to ID Panel     Status: None   Collection Time: 08/10/16  5:00 PM  Result Value Ref Range Status  Specimen Description BLOOD RIGHT JUGLAR  Final   Special Requests BOTTLES DRAWN AEROBIC AND ANAEROBIC 5CC  Final   Culture NO GROWTH 5 DAYS  Final   Report Status 08/15/2016 FINAL  Final  MRSA PCR Screening     Status: None   Collection Time: 08/10/16  7:08 PM  Result Value Ref Range Status   MRSA by PCR NEGATIVE NEGATIVE Final    Comment:        The GeneXpert MRSA Assay (FDA approved for NASAL specimens only), is one component of a comprehensive MRSA colonization surveillance program. It is not intended to diagnose MRSA infection nor to guide or monitor treatment for MRSA infections.       Radiology Studies: Dg Chest Port 1 View  Result Date: 08/14/2016 CLINICAL DATA:  61 y/o  M; shortness of breath and congestion today. EXAM: PORTABLE CHEST 1 VIEW COMPARISON:  08/12/2016 chest radiograph. FINDINGS: Stable cardiac silhouette within normal limits. Interval development of diffuse hazy opacities of the lungs. Possible trace bilateral pleural effusions. Bones are unremarkable. IMPRESSION: Interval development of diffuse hazy opacities of the lungs probably represents pulmonary edema. Possible trace bilateral pleural effusions. Electronically Signed   By: Mitzi HansenLance  Furusawa-Stratton M.D.   On: 08/14/2016 14:36     Medications:  Scheduled: . folic acid  1 mg Oral Daily  . hydrochlorothiazide  25 mg Oral Daily  . [START ON 08/17/2016] Influenza vac split quadrivalent PF  0.5 mL Intramuscular Tomorrow-1000  . mouth  rinse  15 mL Mouth Rinse BID  . pantoprazole  40 mg Oral BID  . [START ON 08/17/2016] pneumococcal 23 valent vaccine  0.5 mL Intramuscular Tomorrow-1000  . thiamine  100 mg Oral Daily   Continuous:  ZOX:WRUEAVPRN:sodium chloride, hydrALAZINE, lidocaine (PF), morphine injection, ondansetron (ZOFRAN) IV, oxyCODONE, simethicone  Assessment/Plan:  Active Problems:   Gastrointestinal hemorrhage with hematemesis   Acute blood loss anemia   Acute duodenal ulcer with hemorrhage   Ulcer of esophagus without bleeding   Acute upper GI bleeding   ARF (acute renal failure) (HCC)   Encephalopathy   Essential hypertension   Acute respiratory failure with hypoxia (HCC)    Upper GI bleed secondary to duodenal ulcer Patient was seen by gastroenterology. Underwent upper endoscopy on 1/7. Site was injected with epinephrine and treated with bipolar cautery. Patient continued to bleed. General surgery and IR consulted. Patient underwent embolization of GDA. Bleeding appears to have subsided. Continue PPI. Will need PPI twice a day for 12 weeks and then daily thereafter per gastroenterology. H. pylori serology is 4.6. Will need to be treated at the time of discharge. Can use Prevpac. Advance to full liquid diet.  Acute blood loss anemia Hemoglobin stable. He was transfused in the ICU.  Acute respiratory failure with aspiration pneumonia Patient was intubated in the intensive care unit. Extubated few days ago. Doing well. He will complete a course of ceftriaxone today.  Essential hypertension with evidence for fluid overload.  Stable. Asian started on hydrochlorothiazide. Blood pressure is still elevated. Continue to monitor for now. May need to add additional agents. Patient was diuresed in the ICU. No need to give more Lasix. Echocardiogram shows normal systolic function.  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 The balloon was inflated, while it was  still in the bulbar urethra. This caused hematuria. Renal function is now improving. Continue Foley catheter.  Urethral injury secondary to misplaced Foley  Seen by urology. Foley catheter to remain in place  at least until January 18. Will need to be seen at urology office to have catheter removed.  Positive blood cultures with 1 out of 2 gram-negative rods Likely contaminant.  DVT Prophylaxis: SCDs    Code Status: Full code  Family Communication: Discussed with the patient  Disposition Plan: Mobilize. In the physical therapy. Plan is for discharge to skilled nursing facility when stable. Anticipate discharge in 2-3 days. Okay for transfer to MedSurg.    LOS: 6 days   [facility]  [facility] Pager [phone] 08/16/2016, 1:45 PM  If 7PM-7AM, please contact night-coverage at [email or web address], password [facility]

## 2016-08-16 NOTE — Care Management Note (Signed)
Case Management Note  Patient Details  Name: Johnny [name] MRN: [id number] Date of Birth: [birth year]  Subjective/Objective:  Pod 5 egd with clipping/injection, s/p IR angio embolization, he is off protonix drip. On oral protonix.  Plan is for SNF.  CSW aware.                  Action/Plan:   Expected Discharge Date:                  Expected Discharge Plan:  Skilled Nursing Facility  In-House Referral:  Clinical Social Work  Discharge planning Services  CM Consult  Post Acute Care Choice:    Choice offered to:     DME Arranged:    DME Agency:     HH Arranged:    HH Agency:     Status of Service:  In process, will continue to follow  If discussed at Long Length of Stay Meetings, dates discussed:    Additional Comments:  Johnny [name], Johnny Spranger [place], RN 08/16/2016, 4:11 PM

## 2016-08-16 NOTE — Clinical Social Work Note (Signed)
CSW presented bed offers to patient. He has chosen [facility]. SNF notified.  Charlynn CourtSarah Kimmy Parish, CSW [phone]

## 2016-08-17 LAB — CBC
HEMATOCRIT: 25.6 % — AB (ref 39.0–52.0)
Hemoglobin: 8.3 g/dL — ABNORMAL LOW (ref 13.0–17.0)
MCH: 27.7 pg (ref 26.0–34.0)
MCHC: 32.4 g/dL (ref 30.0–36.0)
MCV: 85.3 fL (ref 78.0–100.0)
PLATELETS: 242 10*3/uL (ref 150–400)
RBC: 3 MIL/uL — ABNORMAL LOW (ref 4.22–5.81)
RDW: 16.5 % — AB (ref 11.5–15.5)
WBC: 8.2 10*3/uL (ref 4.0–10.5)

## 2016-08-17 LAB — BASIC METABOLIC PANEL
ANION GAP: 7 (ref 5–15)
BUN: 9 mg/dL (ref 6–20)
CALCIUM: 8.3 mg/dL — AB (ref 8.9–10.3)
CO2: 25 mmol/L (ref 22–32)
Chloride: 105 mmol/L (ref 101–111)
Creatinine, Ser: 1.12 mg/dL (ref 0.61–1.24)
GFR calc Af Amer: >60 mL/min (ref >60)
GLUCOSE: 96 mg/dL (ref 65–99)
POTASSIUM: 3.7 mmol/L (ref 3.5–5.1)
Sodium: 137 mmol/L (ref 135–145)

## 2016-08-17 LAB — GLUCOSE, CAPILLARY
GLUCOSE-CAPILLARY: 115 mg/dL — AB (ref 65–99)
GLUCOSE-CAPILLARY: 95 mg/dL (ref 65–99)
Glucose-Capillary: 104 mg/dL — ABNORMAL HIGH (ref 65–99)
Glucose-Capillary: 116 mg/dL — ABNORMAL HIGH (ref 65–99)
Glucose-Capillary: 133 mg/dL — ABNORMAL HIGH (ref 65–99)

## 2016-08-17 NOTE — Clinical Social Work Placement (Signed)
   CLINICAL SOCIAL WORK PLACEMENT  NOTE  Date:  08/17/2016  Patient Details  Name: Johnny [name] MRN: [id number] Date of Birth: Jun 21, 1956  Clinical Social Work is seeking post-discharge placement for this patient at the Skilled  Nursing Facility level of care (*CSW will initial, date and re-position this form in  chart as items are completed):      Patient/family provided with [facility] Clinical Social Work Department's list of facilities offering this level of care within the geographic area requested by the patient (or if unable, by the patient's family).      Patient/family informed of their freedom to choose among providers that offer the needed level of care, that participate in Medicare, Medicaid or managed care program needed by the patient, have an available bed and are willing to accept the patient.      Patient/family informed of [facility]'s ownership interest in [facility] and [facility], as well as of the fact that they are under no obligation to receive care at these facilities.  PASRR submitted to EDS on       PASRR number received on 08/15/16     Existing PASRR number confirmed on       FL2 transmitted to all facilities in geographic area requested by pt/family on 08/15/16     FL2 transmitted to all facilities within larger geographic area on       Patient informed that his/her managed care company has contracts with or will negotiate with certain facilities, including the following:        Yes   Patient/family informed of bed offers received.  [facility] bed [facility]     Physician recommends and patient chooses bed at      Patient to be [facility] on  .  Patient to be transferred to facility by [facility]     Patient family notified on   of transfer.  Name of family member notified:        PHYSICIAN Please prepare prescriptions, Please sign FL2, Please prepare priority discharge summary, including  medications     Additional Comment:    _______________________________________________ Volney AmericanBridget A Mayton, LCSW 08/17/2016, 8:14 AM

## 2016-08-17 NOTE — Progress Notes (Signed)
[facility] PROGRESS NOTE  DANZEL MARSZALEK ZOX:[id number] DOB: February 11, 1956 DOA: 08/10/2016  PCP: [facility]  Brief History/Interval Summary: 61 yr old with PMHx of drug abuse with cocaine and etoh presented on 1/6 was found down with hematochezia and found to have an UGIB 2/2 bleeding duodenal ulcer s/p coil embolization of GDA.  Transferred to Floor 1/13  Reason for Visit: Upper GI bleed  Consultants: Gastroenterology. General surgery. Interventional radiology. Critical care medicine  SIGNIFICANT EVENTS: 1/6- GI bleed, change in MS, ett 1/7 > EGD revealed duodenal ulcer 1/8 > ETT removed, new foley placed 2/2 urethral injury 1/10 > BRBPR, underwent coil embolization of GDA  Subjective/Interval History: Feels ok, mild intermittent abd pain, just started eating solid food today  Assessment/Plan:  Upper GI bleed secondary to duodenal ulcer Patient was seen by gastroenterology. Underwent upper endoscopy on 1/7. Site was injected with epinephrine and treated with bipolar cautery. Patient continued to bleed.  -General surgery and IR consulted.  -s/p IR angioembolization of GDA pseudoaneurysm on 1/10 -Bleeding appears to have subsided. Continue PPI. Will need PPI twice a day for 12 weeks and then daily thereafter per gastroenterology. H. pylori serology is 4.6. Will need to be treated at the time of discharge. Can use Prevpac.  -advance diet -CBC in am and SNF tomorrow if stable  Acute blood loss anemia -Hemoglobin stable now -s/p 7units of PRBC this admission, last on 1/10  Acute respiratory failure with aspiration pneumonia Patient was intubated in the intensive care unit. Extubated on 1/8 -stable, weaned off O2 -completed a course of ceftriaxone for ? Aspiration pneumonia  Essential hypertension with evidence for fluid overload.  Stable now, s/p lasix in ICU -now on hydrochlorothiazide, will continue -Echocardiogram shows normal systolic  function.  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 The balloon was inflated, while it was still in the bulbar urethra while in ICU causing hematuria.  -improved, Renal function is now improving. Continue Foley catheter. -will need to be discharged to SNF with Foley catheter  Urethral injury secondary to Foley trauma -Seen by urology. Foley catheter to remain in place at least until January 18. Will need to be seen at urology office to have catheter removed.  Positive blood cultures with 1 out of 2 gram-posittive rods -diphtheroids c/w contamination  DVT Prophylaxis: SCDs    Code Status: Full code  Family Communication: Discussed with the patient  Disposition Plan: SNF tomorrow if stable   Transthoracic echocardiogram Study Conclusions - Left ventricle: The cavity size was normal. Systolic function was   normal. The estimated ejection fraction was in the range of 55%   to 60%. Wall motion was normal; there were no regional wall   motion abnormalities. Left ventricular diastolic function   parameters were normal.  Antibiotics: Ceftriaxone 1/10-1/12  Objective:  Vital Signs  Vitals:   08/16/16 1550 08/16/16 1826 08/16/16 2010 08/17/16 0443  BP: (!) 142/81 (!) 147/76 (!) 154/85 (!) 153/89  Pulse:  93 93 90  Resp: (!) 26 20 20 18   Temp: 99.3 F (37.4 C) 98.2 F (36.8 C) 98.3 F (36.8 C) 98.3 F (36.8 C)  TempSrc: Oral Oral Oral Oral  SpO2: 97% 96% 95% 94%  Weight:  98.5 kg (217 lb 2.5 oz)  96.5 kg (212 lb 11.9 oz)  Height:        Intake/Output Summary (Last 24 hours) at 08/17/16 0901 Last data filed at 08/17/16 0557  Gross per  24 hour  Intake             1960 ml  Output             2700 ml  Net             -740 ml   Filed Weights   08/16/16 0425 08/16/16 1826 08/17/16 0443  Weight: 96.5 kg (212 lb 11.9 oz) 98.5 kg (217 lb 2.5 oz) 96.5 kg (212 lb 11.9 oz)    General appearance: AAOx3, no  distress Resp: CTAB Cardio: S1S2/regular rate and rhythm no murmur, click, rub or gallop GI: soft, non-tender; bowel sounds normal; no masses,  no organomegaly Extremities: trace edema Neurologic: Awake and alert. Oriented 3. No focal neurological deficits.  Lab Results:  Data Reviewed: I have personally reviewed following labs and imaging studies  CBC:  Recent Labs Lab 08/10/16 1455  08/15/16 0142 08/15/16 0644 08/15/16 1647 08/16/16 0851 08/17/16 0509  WBC 11.6*  < > 9.9 9.1 8.7 8.4 8.2  NEUTROABS 8.6*  --   --   --   --   --   --   HGB 6.3*  < > 7.7* 7.9* 8.1* 8.1* 8.3*  HCT 20.0*  < > 23.6* 24.2* 24.6* 25.8* 25.6*  MCV 81.3  < > 84.6 84.3 85.4 84.9 85.3  PLT 295  < > 166 184 169 229 242  < > = values in this interval not displayed.  Basic Metabolic Panel:  Recent Labs Lab 08/13/16 0815 08/14/16 0451 08/15/16 0644 08/16/16 0446 08/17/16 0509  NA 142 141 142 140 137  K 4.9 4.6 3.9 3.7 3.7  CL 105 107 110 107 105  CO2 30 28 25 26 25   GLUCOSE 92 109* 96 96 96  BUN 31* 20 13 10 9   CREATININE 2.15* 1.41* 1.06 1.10 1.12  CALCIUM 8.3* 8.3* 8.2* 8.1* 8.3*    GFR: Estimated Creatinine Clearance: 83.7 mL/min (by C-G formula based on SCr of 1.12 mg/dL).  Liver Function Tests:  Recent Labs Lab 08/10/16 1455 08/10/16 1700  AST 28 24  ALT 18 18  ALKPHOS 53 57  BILITOT <0.1* 0.2*  PROT 5.4* 5.4*  ALBUMIN 2.7* 2.6*     Recent Labs Lab 08/10/16 1455 08/10/16 1700  LIPASE 19 18  AMYLASE  --  56    Recent Labs Lab 08/10/16 1700  AMMONIA 37*    Coagulation Profile:  Recent Labs Lab 08/10/16 1455 08/10/16 1700 08/14/16 0918 08/14/16 1243 08/15/16 0644  INR 1.10 1.16 1.15 1.11 1.13    CBG:  Recent Labs Lab 08/16/16 1548 08/16/16 1941 08/17/16 0012 08/17/16 0447 08/17/16 0818  GLUCAP 147* 107* 104* 115* 95    Lipid Profile: No results for input(s): CHOL, HDL, LDLCALC, TRIG, CHOLHDL, LDLDIRECT in the last 72 hours.   Recent  Results (from the past 240 hour(s))  Blood culture (routine x 2)     Status: Abnormal   Collection Time: 08/10/16  3:30 PM  Result Value Ref Range Status   Specimen Description BLOOD RIGHT ANTECUBITAL  Final   Special Requests BOTTLES DRAWN AEROBIC AND ANAEROBIC 5 CC  Final   Culture  Setup Time   Final    GRAM POSITIVE RODS AEROBIC BOTTLE ONLY CRITICAL RESULT CALLED TO, READ BACK BY AND VERIFIED WITH: ADagoberto Ligas. Johnston Pharm.D. 10:20 08/13/16 ([email or web address])    Culture (A)  Final    DIPHTHEROIDS(CORYNEBACTERIUM SPECIES) Standardized susceptibility testing for this organism is not available.    Report Status 08/14/2016 FINAL  Final  Blood Culture ID Panel (Reflexed)     Status: None   Collection Time: 08/10/16  3:30 PM  Result Value Ref Range Status   Enterococcus species NOT DETECTED NOT DETECTED Final   Listeria monocytogenes NOT DETECTED NOT DETECTED Final   Staphylococcus species NOT DETECTED NOT DETECTED Final   Staphylococcus aureus NOT DETECTED NOT DETECTED Final   Streptococcus species NOT DETECTED NOT DETECTED Final   Streptococcus agalactiae NOT DETECTED NOT DETECTED Final   Streptococcus pneumoniae NOT DETECTED NOT DETECTED Final   Streptococcus pyogenes NOT DETECTED NOT DETECTED Final   Acinetobacter baumannii NOT DETECTED NOT DETECTED Final   Enterobacteriaceae species NOT DETECTED NOT DETECTED Final   Enterobacter cloacae complex NOT DETECTED NOT DETECTED Final   Escherichia coli NOT DETECTED NOT DETECTED Final   Klebsiella oxytoca NOT DETECTED NOT DETECTED Final   Klebsiella pneumoniae NOT DETECTED NOT DETECTED Final   Proteus species NOT DETECTED NOT DETECTED Final   Serratia marcescens NOT DETECTED NOT DETECTED Final   Haemophilus influenzae NOT DETECTED NOT DETECTED Final   Neisseria meningitidis NOT DETECTED NOT DETECTED Final   Pseudomonas aeruginosa NOT DETECTED NOT DETECTED Final   Candida albicans NOT DETECTED NOT DETECTED Final   Candida glabrata NOT DETECTED NOT  DETECTED Final   Candida krusei NOT DETECTED NOT DETECTED Final   Candida parapsilosis NOT DETECTED NOT DETECTED Final   Candida tropicalis NOT DETECTED NOT DETECTED Final  Culture, blood (Routine X 2) w Reflex to ID Panel     Status: None   Collection Time: 08/10/16  5:00 PM  Result Value Ref Range Status   Specimen Description BLOOD RIGHT JUGLAR  Final   Special Requests BOTTLES DRAWN AEROBIC AND ANAEROBIC 5CC  Final   Culture NO GROWTH 5 DAYS  Final   Report Status 08/15/2016 FINAL  Final  MRSA PCR Screening     Status: None   Collection Time: 08/10/16  7:08 PM  Result Value Ref Range Status   MRSA by PCR NEGATIVE NEGATIVE Final    Comment:        The GeneXpert MRSA Assay (FDA approved for NASAL specimens only), is one component of a comprehensive MRSA colonization surveillance program. It is not intended to diagnose MRSA infection nor to guide or monitor treatment for MRSA infections.       Radiology Studies: No results found.   Medications:  Scheduled: . folic acid  1 mg Oral Daily  . hydrochlorothiazide  25 mg Oral Daily  . Influenza vac split quadrivalent PF  0.5 mL Intramuscular Tomorrow-1000  . mouth rinse  15 mL Mouth Rinse BID  . pantoprazole  40 mg Oral BID  . pneumococcal 23 valent vaccine  0.5 mL Intramuscular Tomorrow-1000  . thiamine  100 mg Oral Daily   Continuous:  WUJ:[id number], morphine injection, ondansetron (ZOFRAN) IV, oxyCODONE, simethicone    LOS: 7 days   [facility]  [facility] Pager [phone] 08/17/2016, 9:01 AM  If 7PM-7AM, please contact night-coverage at [email or web address], password [facility]

## 2016-08-18 LAB — GLUCOSE, CAPILLARY
GLUCOSE-CAPILLARY: 96 mg/dL (ref 65–99)
GLUCOSE-CAPILLARY: 110 mg/dL — AB (ref 65–99)
Glucose-Capillary: 106 mg/dL — ABNORMAL HIGH (ref 65–99)
Glucose-Capillary: 116 mg/dL — ABNORMAL HIGH (ref 65–99)

## 2016-08-18 LAB — CBC
HEMATOCRIT: 28.7 % — AB (ref 39.0–52.0)
HEMOGLOBIN: 9.1 g/dL — AB (ref 13.0–17.0)
MCH: 27.6 pg (ref 26.0–34.0)
MCHC: 31.7 g/dL (ref 30.0–36.0)
MCV: 87 fL (ref 78.0–100.0)
Platelets: 362 10*3/uL (ref 150–400)
RBC: 3.3 MIL/uL — AB (ref 4.22–5.81)
RDW: 16.4 % — ABNORMAL HIGH (ref 11.5–15.5)
WBC: 8 10*3/uL (ref 4.0–10.5)

## 2016-08-18 LAB — BASIC METABOLIC PANEL
Anion gap: 11 (ref 5–15)
BUN: 17 mg/dL (ref 6–20)
CHLORIDE: 103 mmol/L (ref 101–111)
CO2: 25 mmol/L (ref 22–32)
Calcium: 8.7 mg/dL — ABNORMAL LOW (ref 8.9–10.3)
Creatinine, Ser: 1.22 mg/dL (ref 0.61–1.24)
GFR calc Af Amer: >60 mL/min (ref >60)
GFR calc non Af Amer: >60 mL/min (ref >60)
GLUCOSE: 99 mg/dL (ref 65–99)
POTASSIUM: 4.3 mmol/L (ref 3.5–5.1)
Sodium: 139 mmol/L (ref 135–145)

## 2016-08-18 MED ORDER — ENOXAPARIN SODIUM 40 MG/0.4ML ~~LOC~~ SOLN
40.0000 mg | SUBCUTANEOUS | Status: DC
  Administered 2016-08-18 – 2016-08-21 (×4): 40 mg via SUBCUTANEOUS
  Filled 2016-08-18 (×4): qty 0.4

## 2016-08-18 MED ORDER — POLYETHYLENE GLYCOL 3350 17 G PO PACK
17.0000 g | PACK | Freq: Every day | ORAL | Status: DC
  Administered 2016-08-18 – 2016-08-21 (×4): 17 g via ORAL
  Filled 2016-08-18 (×4): qty 1

## 2016-08-18 NOTE — Progress Notes (Signed)
[facility] PROGRESS NOTE  Johnny [name] EPP:[id number] DOB: 1956-01-02 DOA: 08/10/2016  PCP: [facility]  Brief History/Interval Summary: 61 yr old with PMHx of drug abuse with cocaine and etoh presented on 1/6 was found down with hematochezia and found to have an UGIB 2/2 bleeding duodenal ulcer s/p coil embolization of GDA.  Transferred to Floor 1/13  Reason for Visit: Upper GI bleed  Consultants: Gastroenterology. General surgery. Interventional radiology. Critical care medicine  SIGNIFICANT EVENTS: 1/6- GI bleed, change in MS, ett 1/7 > EGD revealed duodenal ulcer 1/8 > ETT removed, new foley placed 2/2 urethral injury 1/10 > BRBPR, underwent coil embolization of GDA  Subjective/Interval History: Having more abd pain today, feel uncomfortable, last BM 2days ago  Assessment/Plan:  Upper GI bleed secondary to duodenal ulcer Patient was seen by gastroenterology. Underwent upper endoscopy on 1/7. Site was injected with epinephrine and treated with bipolar cautery. Patient continued to bleed.  -General surgery and IR consulted.  -s/p IR angioembolization of GDA pseudoaneurysm on 1/10 -Bleeding appears to have subsided. Continue PPI. Will need PPI twice a day for 12 weeks and then daily thereafter per gastroenterology. H. pylori serology is 4.6. Will need to be treated at the time of discharge. Can use Prevpac.  -check labs today due to increased abd discomfort will watch another day in hospital  Acute blood loss anemia -Hemoglobin stable now -s/p 7units of PRBC this admission, last on 1/10  Acute respiratory failure with aspiration pneumonia Patient was intubated in the intensive care unit. Extubated on 1/8 -stable, weaned off O2 -completed a course of ceftriaxone for ? Aspiration pneumonia  Essential hypertension with evidence for fluid overload.  Stable now, s/p lasix in ICU -now on hydrochlorothiazide, will continue -Echocardiogram shows  normal systolic function.  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 The balloon was inflated, while it was still in the bulbar urethra while in ICU causing hematuria.  -improved, Renal function is now improving. Continue Foley catheter. -will need to be discharged to SNF with Foley catheter  Urethral injury secondary to Foley trauma -Seen by urology. Foley catheter to remain in place at least until January 18. Will need to be seen at urology office to have catheter removed.  Positive blood cultures with 1 out of 2 gram-posittive rods -diphtheroids c/w contamination  DVT Prophylaxis: SCDs    Code Status: Full code  Family Communication: Discussed with the patient  Disposition Plan: SNF tomorrow if stable   Transthoracic echocardiogram Study Conclusions - Left ventricle: The cavity size was normal. Systolic function was   normal. The estimated ejection fraction was in the range of 55%   to 60%. Wall motion was normal; there were no regional wall   motion abnormalities. Left ventricular diastolic function   parameters were normal.  Antibiotics: Ceftriaxone 1/10-1/12  Objective:  Vital Signs  Vitals:   08/17/16 0443 08/17/16 1315 08/17/16 2100 08/18/16 0613  BP: (!) 153/89 139/86 124/70 134/71  Pulse: 90 (!) 102 100 86  Resp: 18 18 19 17   Temp: 98.3 F (36.8 C) 97.9 F (36.6 C) 98.5 F (36.9 C) 98.3 F (36.8 C)  TempSrc: Oral Oral Oral   SpO2: 94% 100% 96% 99%  Weight: 96.5 kg (212 lb 11.9 oz)   97.1 kg (214 lb 1.1 oz)  Height:        Intake/Output Summary (Last 24 hours) at 08/18/16 1304 Last data filed at 08/18/16 0926  Gross per  24 hour  Intake              480 ml  Output             1800 ml  Net            -1320 ml   Filed Weights   08/16/16 1826 08/17/16 0443 08/18/16 0613  Weight: 98.5 kg (217 lb 2.5 oz) 96.5 kg (212 lb 11.9 oz) 97.1 kg (214 lb 1.1 oz)    General appearance: AAOx3, no  distress Resp: CTAB Cardio: S1S2/regular rate and rhythm no murmur, click, rub or gallop GI: soft, mild upper abd tenderness; bowel sounds normal; no masses,  no organomegaly Extremities: trace edema Neurologic: Awake and alert. Oriented 3. No focal neurological deficits.  Lab Results:  Data Reviewed: I have personally reviewed following labs and imaging studies  CBC:  Recent Labs Lab 08/15/16 0644 08/15/16 1647 08/16/16 0851 08/17/16 0509 08/18/16 1048  WBC 9.1 8.7 8.4 8.2 8.0  HGB 7.9* 8.1* 8.1* 8.3* 9.1*  HCT 24.2* 24.6* 25.8* 25.6* 28.7*  MCV 84.3 85.4 84.9 85.3 87.0  PLT 184 169 229 242 362    Basic Metabolic Panel:  Recent Labs Lab 08/14/16 0451 08/15/16 0644 08/16/16 0446 08/17/16 0509 08/18/16 1048  NA 141 142 140 137 139  K 4.6 3.9 3.7 3.7 4.3  CL 107 110 107 105 103  CO2 28 25 26 25 25   GLUCOSE 109* 96 96 96 99  BUN 20 13 10 9 17   CREATININE 1.41* 1.06 1.10 1.12 1.22  CALCIUM 8.3* 8.2* 8.1* 8.3* 8.7*    GFR: Estimated Creatinine Clearance: 77.1 mL/min (by C-G formula based on SCr of 1.22 mg/dL).  Liver Function Tests: No results for input(s): AST, ALT, ALKPHOS, BILITOT, PROT, ALBUMIN in the last 168 hours.  No results for input(s): LIPASE, AMYLASE in the last 168 hours. No results for input(s): AMMONIA in the last 168 hours.  Coagulation Profile:  Recent Labs Lab 08/14/16 0918 08/14/16 1243 08/15/16 0644  INR 1.15 1.11 1.13    CBG:  Recent Labs Lab 08/17/16 0818 08/17/16 1558 08/17/16 2012 08/18/16 0006 08/18/16 1152  GLUCAP 95 116* 133* 106* 96    Lipid Profile: No results for input(s): CHOL, HDL, LDLCALC, TRIG, CHOLHDL, LDLDIRECT in the last 72 hours.   Recent Results (from the past 240 hour(s))  Blood culture (routine x 2)     Status: Abnormal   Collection Time: 08/10/16  3:30 PM  Result Value Ref Range Status   Specimen Description BLOOD RIGHT ANTECUBITAL  Final   Special Requests BOTTLES DRAWN AEROBIC AND ANAEROBIC  5 CC  Final   Culture  Setup Time   Final    GRAM POSITIVE RODS AEROBIC BOTTLE ONLY CRITICAL RESULT CALLED TO, READ BACK BY AND VERIFIED WITH: ADagoberto Ligas.D. 10:20 08/13/16 ([email or web address])    Culture (A)  Final    DIPHTHEROIDS(CORYNEBACTERIUM SPECIES) Standardized susceptibility testing for this organism is not available.    Report Status 08/14/2016 FINAL  Final  Blood Culture ID Panel (Reflexed)     Status: None   Collection Time: 08/10/16  3:30 PM  Result Value Ref Range Status   Enterococcus species NOT DETECTED NOT DETECTED Final   Listeria monocytogenes NOT DETECTED NOT DETECTED Final   Staphylococcus species NOT DETECTED NOT DETECTED Final   Staphylococcus aureus NOT DETECTED NOT DETECTED Final   Streptococcus species NOT DETECTED NOT DETECTED Final   Streptococcus agalactiae NOT DETECTED NOT DETECTED Final   Streptococcus  pneumoniae NOT DETECTED NOT DETECTED Final   Streptococcus pyogenes NOT DETECTED NOT DETECTED Final   Acinetobacter baumannii NOT DETECTED NOT DETECTED Final   Enterobacteriaceae species NOT DETECTED NOT DETECTED Final   Enterobacter cloacae complex NOT DETECTED NOT DETECTED Final   Escherichia coli NOT DETECTED NOT DETECTED Final   Klebsiella oxytoca NOT DETECTED NOT DETECTED Final   Klebsiella pneumoniae NOT DETECTED NOT DETECTED Final   Proteus species NOT DETECTED NOT DETECTED Final   Serratia marcescens NOT DETECTED NOT DETECTED Final   Haemophilus influenzae NOT DETECTED NOT DETECTED Final   Neisseria meningitidis NOT DETECTED NOT DETECTED Final   Pseudomonas aeruginosa NOT DETECTED NOT DETECTED Final   Candida albicans NOT DETECTED NOT DETECTED Final   Candida glabrata NOT DETECTED NOT DETECTED Final   Candida krusei NOT DETECTED NOT DETECTED Final   Candida parapsilosis NOT DETECTED NOT DETECTED Final   Candida tropicalis NOT DETECTED NOT DETECTED Final  Culture, blood (Routine X 2) w Reflex to ID Panel     Status: None   Collection Time: 08/10/16   5:00 PM  Result Value Ref Range Status   Specimen Description BLOOD RIGHT JUGLAR  Final   Special Requests BOTTLES DRAWN AEROBIC AND ANAEROBIC 5CC  Final   Culture NO GROWTH 5 DAYS  Final   Report Status 08/15/2016 FINAL  Final  MRSA PCR Screening     Status: None   Collection Time: 08/10/16  7:08 PM  Result Value Ref Range Status   MRSA by PCR NEGATIVE NEGATIVE Final    Comment:        The GeneXpert MRSA Assay (FDA approved for NASAL specimens only), is one component of a comprehensive MRSA colonization surveillance program. It is not intended to diagnose MRSA infection nor to guide or monitor treatment for MRSA infections.       Radiology Studies: No results found.   Medications:  Scheduled: . enoxaparin (LOVENOX) injection  40 mg Subcutaneous Q24H  . folic acid  1 mg Oral Daily  . hydrochlorothiazide  25 mg Oral Daily  . mouth rinse  15 mL Mouth Rinse BID  . pantoprazole  40 mg Oral BID  . thiamine  100 mg Oral Daily   Continuous:  ZOX:[id number]:hydrALAZINE, morphine injection, ondansetron (ZOFRAN) IV, oxyCODONE, simethicone    LOS: 8 days   Malcom Randall Va Medical CenterJOSEPH,Analiya Porco  [facility] Pager [phone] 08/18/2016, 1:04 PM  If 7PM-7AM, please contact night-coverage at [email or web address], password [email or web address]

## 2016-08-19 LAB — BASIC METABOLIC PANEL
Anion gap: 6 (ref 5–15)
BUN: 16 mg/dL (ref 6–20)
CALCIUM: 8.6 mg/dL — AB (ref 8.9–10.3)
CO2: 28 mmol/L (ref 22–32)
Chloride: 105 mmol/L (ref 101–111)
Creatinine, Ser: 1.19 mg/dL (ref 0.61–1.24)
GFR calc Af Amer: >60 mL/min (ref >60)
GLUCOSE: 85 mg/dL (ref 65–99)
POTASSIUM: 4.1 mmol/L (ref 3.5–5.1)
SODIUM: 139 mmol/L (ref 135–145)

## 2016-08-19 LAB — CBC
HEMATOCRIT: 26.1 % — AB (ref 39.0–52.0)
Hemoglobin: 8 g/dL — ABNORMAL LOW (ref 13.0–17.0)
MCH: 26.9 pg (ref 26.0–34.0)
MCHC: 30.7 g/dL (ref 30.0–36.0)
MCV: 87.9 fL (ref 78.0–100.0)
PLATELETS: 381 10*3/uL (ref 150–400)
RBC: 2.97 MIL/uL — ABNORMAL LOW (ref 4.22–5.81)
RDW: 16.5 % — AB (ref 11.5–15.5)
WBC: 6.9 10*3/uL (ref 4.0–10.5)

## 2016-08-19 LAB — GLUCOSE, CAPILLARY
Glucose-Capillary: 108 mg/dL — ABNORMAL HIGH (ref 65–99)
Glucose-Capillary: 114 mg/dL — ABNORMAL HIGH (ref 65–99)
Glucose-Capillary: 114 mg/dL — ABNORMAL HIGH (ref 65–99)
Glucose-Capillary: 121 mg/dL — ABNORMAL HIGH (ref 65–99)
Glucose-Capillary: 93 mg/dL (ref 65–99)
Glucose-Capillary: 97 mg/dL (ref 65–99)
Glucose-Capillary: 99 mg/dL (ref 65–99)

## 2016-08-19 MED ORDER — OXYCODONE-ACETAMINOPHEN 5-325 MG PO TABS: 1.0000 | ORAL_TABLET | Freq: Four times a day (QID) | ORAL | 0 refills | Status: DC | PRN

## 2016-08-19 MED ORDER — HYDROCHLOROTHIAZIDE 25 MG PO TABS: 25.0000 mg | ORAL_TABLET | Freq: Every day | ORAL | Status: DC

## 2016-08-19 MED ORDER — AMOXICILL-CLARITHRO-LANSOPRAZ PO MISC: Freq: Two times a day (BID) | ORAL | 0 refills | Status: DC

## 2016-08-19 MED ORDER — POLYETHYLENE GLYCOL 3350 17 G PO PACK: 17.0000 g | PACK | Freq: Every day | ORAL | 0 refills | Status: DC | PRN

## 2016-08-19 MED ORDER — PANTOPRAZOLE SODIUM 40 MG PO TBEC: 40.0000 mg | DELAYED_RELEASE_TABLET | Freq: Two times a day (BID) | ORAL | Status: DC

## 2016-08-19 NOTE — Progress Notes (Signed)
Pt. Stated he did not have any of his belongings and was wondering where they were at. ER department and original unit pt. Came from were called and both departments stated they did not have any of his belongings. Pt. Also stated that his family may have his belongings. I instructed the pt. To call his family members to see if they have any of his belongings at home.

## 2016-08-19 NOTE — Discharge Summary (Addendum)
Physician Discharge Summary  Johnny [name]:[id number] DOB: 1956-06-14 DOA: 08/10/2016  PCP: [facility]  Admit date: 08/10/2016 Discharge date: 08/20/2016  Time spent: 35 minutes  Recommendations for Outpatient Follow-up:  1. PCP at [facility] in 1 week, after Prevpac completed needs Protonix BID for 10-12weeks 2. Urology Dr.Benjamin Louis Meckel in 1 week for Voiding Trial/DC foley etc 3.  Foley Care 4. FU with [facility] Dr.Gessnar   Discharge Diagnoses:  Active Problems:   Gastrointestinal hemorrhage with hematemesis   Acute blood loss anemia   Acute duodenal ulcer with hemorrhage   Ulcer of esophagus without bleeding   Acute upper GI bleeding   ARF (acute renal failure) (HCC)   Encephalopathy   Essential hypertension   Acute respiratory failure with hypoxia [facility])    Iatrogenic bulbar urethral injury  Discharge Condition: stable  Diet recommendation: regular diet  Filed Weights   08/16/16 1826 08/17/16 0443 08/18/16 0613  Weight: 98.5 kg (217 lb 2.5 oz) 96.5 kg (212 lb 11.9 oz) 97.1 kg (214 lb 1.1 oz)    History of present illness:  61 yr old with PMHx of drug abuse with cocaine and etoh presented on 1/6 was found down with hematochezia and found to have an UGIB, intubated in ER and admitted to ICU  Hospital Course:  Upper GI bleed secondary to duodenal ulcer Patient was seen by gastroenterology. Underwent upper endoscopy on 1/7. Site was injected with epinephrine and treated with bipolar cautery. Patient however continued to bleed hence General surgery and IR were consulted.  -Then underwent Angioembolization of GDA pseudoaneurysm on 1/10 by IR -Bleeding appears to have subsided. Continue PPI. Will need PPI twice a day for 12 weeks and then daily thereafter per gastroenterology. H.pylori serology positive and hence started prevpac since this has lansoprazole, he doesn't need duplication of PPI but when prevpac is completed Protonix BID needs to be  resumed -FU with [facility] Dr.gessnar  Acute blood loss anemia -Hemoglobin stable now -s/p 7units of PRBC this admission, last on 1/10 -Hb 8-9 range now  Acute respiratory failure with aspiration pneumonia Patient was intubated in the intensive care unit. Extubated on 1/8 -stable, weaned off O2 -completed a course of ceftriaxone for ? Aspiration pneumonia  Essential hypertension with evidence for fluid overload.  Stable now, s/p lasix in ICU -now on hydrochlorothiazide, will be continued -Echocardiogram shows normal systolic function.  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  -improved, Renal function is now improving. Continue Foley catheter. -discharged to SNF with Foley catheter with need Urology FU in 1 week to DC foley/coiding trial  Urethral injury secondary to Foley trauma -Seen by urology. Foley catheter to remain in place at least until January 18. Will need to be seen at urology office to have catheter removed.  Positive blood cultures with 1 out of 2 gram-posittive rods -diphtheroids c/w contamination  Procedures: 1/7 > EGD revealed duodenal ulcer Site was injected with epinephrine and treated with bipolar cautery 1/8 > ETT removed, new foley placed 2/2 urethral injury 1/10 > BRBPR, underwent coil embolization of GDA   Consultations:  GI, Urology, PCCM  Discharge Exam: Vitals:   08/18/16 2221 08/19/16 0422  BP: 132/77 130/80  Pulse: 97 89  Resp: 18 17  Temp: 98.1 F (36.7 C) 98 F (36.7 C)    General: AAOx3 Cardiovascular:  S1S2/RRR Respiratory: CTAB  Discharge Instructions   Discharge Instructions    Diet - low sodium heart healthy    Complete by:  As directed    Increase activity slowly    Complete by:  As directed      Current Discharge Medication List    START  taking these medications   Details  amoxicillin-clarithromycin-lansoprazole (PREVPAC) combo pack Take by mouth 2 (two) times daily. Follow package directions. Qty: 1 kit, Refills: 0    hydrochlorothiazide (HYDRODIURIL) 25 MG tablet Take 1 tablet (25 mg total) by mouth daily.    polyethylene glycol (MIRALAX / GLYCOLAX) packet Take 17 g by mouth daily as needed. Qty: 14 each, Refills: 0      CONTINUE these medications which have CHANGED   Details  oxyCODONE-acetaminophen (PERCOCET/ROXICET) 5-325 MG tablet Take 1-2 tablets by mouth every 6 (six) hours as needed for severe pain. Qty: 20 tablet, Refills: 0      STOP taking these medications     docusate sodium (COLACE) 100 MG capsule      methocarbamol (ROBAXIN) 500 MG tablet      oxyCODONE (ROXICODONE) 5 MG immediate release tablet        Allergies  Allergen Reactions  . Ketamine Other (See Comments)    Noted "bad reaction" during intubation 08/10/2016 - MD recommended to not use on this patient ever again.     Contact information for follow-up providers    Call Ardis Hughs, MD.   Specialty:  Urology Why:  For an appointment in 1 week when you get home. Contact information: [place] [address] [phone]            Contact information for after-discharge care    [facility] Follow up.   Specialty:  [place] information: [address] [place] [phone]                   The results of significant diagnostics from this hospitalization (including imaging, microbiology, ancillary and laboratory) are listed below for reference.    Significant Diagnostic Studies: Ct Head Wo Contrast  Result Date: 08/10/2016 CLINICAL DATA:  Hypertension.  Probable GI bleed. EXAM: CT HEAD WITHOUT CONTRAST TECHNIQUE: Contiguous axial images were obtained from the base of the skull through the vertex without intravenous  contrast. COMPARISON:  10/15/2013 FINDINGS: Brain: There is no intracranial hemorrhage, mass or evidence of acute infarction. There is no extra-axial fluid collection. Gray matter and white matter appear normal. Cerebral volume is normal for age. Brainstem and posterior fossa are unremarkable. The CSF spaces appear normal. Vascular: No hyperdense vessel or unexpected calcification. Skull: Normal. Negative for fracture or focal lesion. Sinuses/Orbits: No acute finding. Other: None. IMPRESSION: Normal brain Electronically Signed   By: Andreas Newport M.D.   On: 08/10/2016 21:16   Ir Angiogram Visceral Selective  Result Date: 08/14/2016 INDICATION: Known active duodenal ulcer, status post endoscopic epi injection, cautery and endoscopic clipping. Recurrent GI bleeding with acute bright red blood per rectum. EXAM: SELECTIVE VISCERAL ARTERIOGRAPHY; ADDITIONAL ARTERIOGRAPHY; IR ULTRASOUND GUIDANCE VASC ACCESS RIGHT; IR EMBO ART VEN HEMORR LYMPH EXTRAV INC GUIDE ROADMAPPING; ARTERIOGRAPHY MEDICATIONS: None. ANESTHESIA/SEDATION: Moderate (conscious) sedation was employed during this procedure. A total of Versed 6.0 mg and Fentanyl 250 mcg was administered intravenously. Moderate Sedation Time: 70 Minutes. The patient's level of consciousness and vital signs were monitored continuously by radiology nursing throughout the procedure under my direct  supervision. CONTRAST:  27m ISOVUE-300 IOPAMIDOL (ISOVUE-300) INJECTION 61%, 578mISOVUE-300 IOPAMIDOL (ISOVUE-300) INJECTION 61%, 1040mSOVUE-300 IOPAMIDOL (ISOVUE-300) INJECTION 61% FLUOROSCOPY TIME:  Fluoroscopy Time: 28 minutes 48 seconds (1,118 mGy). COMPLICATIONS: None immediate. PROCEDURE: Informed consent was obtained from the patient following explanation of the procedure, risks, benefits and alternatives. The patient understands, agrees and consents for the procedure. All questions were addressed. A time out was performed prior to the initiation of the procedure.  Maximal barrier sterile technique utilized including caps, mask, sterile gowns, sterile gloves, large sterile drape, hand hygiene, and Betadine prep. Previous imaging and endoscopic pictures reviewed. Under sterile conditions and local anesthesia, ultrasound micropuncture access performed of the right common femoral artery. Five French sheath inserted. C2 catheter utilized to select the celiac origin. Selective celiac angiogram performed. Celiac: Celiac origin is widely patent. Splenic and hepatic vasculature are patent. Over a Glidewire, the C2 catheter was advanced into the common hepatic artery. Common hepatic angiogram performed. Common hepatic: Common hepatic, GDA, proper hepatic, right hepatic and left hepatic arteries are all patent. Lantern microcatheter utilized over a fathom guidewire to eventually selected the gastroduodenal artery. Selected GDA angiogram performed. GDA: Gastroduodenal artery is patent as well as the gastroepiploic artery. 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 GDA embolization: Through the microcatheter, 3 mm x 30 mm (2) and 3 mm right 20 mm(1) Ruby micro coils were deployed within the GDA occluding the mid GDA pseudoaneurysm. Following embolization, final angiogram confirms occlusion of the GDA with no longer visualization of the pseudoaneurysm. Repeat common hepatic angiogram confirms patency of the hepatic vasculature. Access removed. Hemostasis obtained with a Exoseal device. No immediate complication. Patient tolerated the procedure well. IMPRESSION: Successful celiac, common hepatic, gastroduodenal angiograms. 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 Electronically Signed   By: M. Jerilynn MagesShick M.D.   On: 08/14/2016 12:47   Ir Angiogram Selective Each Additional Vessel  Result Date: 08/14/2016 INDICATION: Known active duodenal ulcer, status post endoscopic epi injection, cautery and endoscopic  clipping. Recurrent GI bleeding with acute bright red blood per rectum. EXAM: SELECTIVE VISCERAL ARTERIOGRAPHY; ADDITIONAL ARTERIOGRAPHY; IR ULTRASOUND GUIDANCE VASC ACCESS RIGHT; IR EMBO ART VEN HEMORR LYMPH EXTRAV INC GUIDE ROADMAPPING; ARTERIOGRAPHY MEDICATIONS: None. ANESTHESIA/SEDATION: Moderate (conscious) sedation was employed during this procedure. A total of Versed 6.0 mg and Fentanyl 250 mcg was administered intravenously. Moderate Sedation Time: 70 Minutes. The patient's level of consciousness and vital signs were monitored continuously by radiology nursing throughout the procedure under my direct supervision. CONTRAST:  21m28mOVUE-300 IOPAMIDOL (ISOVUE-300) INJECTION 61%, 21mL12mVUE-300 IOPAMIDOL (ISOVUE-300) INJECTION 61%, 10mL 21mUE-300 IOPAMIDOL (ISOVUE-300) INJECTION 61% FLUOROSCOPY TIME:  Fluoroscopy Time: 28 minutes 48 seconds (1,118 mGy). COMPLICATIONS: None immediate. PROCEDURE: Informed consent was obtained from the patient following explanation of the procedure, risks, benefits and alternatives. The patient understands, agrees and consents for the procedure. All questions were addressed. A time out was performed prior to the initiation of the procedure. Maximal barrier sterile technique utilized including caps, mask, sterile gowns, sterile gloves, large sterile drape, hand hygiene, and Betadine prep. Previous imaging and endoscopic pictures reviewed. Under sterile conditions and local anesthesia, ultrasound micropuncture access performed of the right common femoral artery. Five French sheath inserted. C2 catheter utilized to select the celiac origin. Selective celiac angiogram performed. Celiac: Celiac origin is widely patent. Splenic and hepatic vasculature are patent. Over a Glidewire, the C2 catheter was advanced into the common hepatic artery. Common hepatic angiogram performed. Common  hepatic: Common hepatic, GDA, proper hepatic, right hepatic and left hepatic arteries are all patent.  Lantern microcatheter utilized over a fathom guidewire to eventually selected the gastroduodenal artery. Selected GDA angiogram performed. GDA: Gastroduodenal artery is patent as well as the gastroepiploic artery. 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 GDA embolization: Through the microcatheter, 3 mm x 30 mm (2) and 3 mm right 20 mm(1) Ruby micro coils were deployed within the GDA occluding the mid GDA pseudoaneurysm. Following embolization, final angiogram confirms occlusion of the GDA with no longer visualization of the pseudoaneurysm. Repeat common hepatic angiogram confirms patency of the hepatic vasculature. Access removed. Hemostasis obtained with a Exoseal device. No immediate complication. Patient tolerated the procedure well. IMPRESSION: Successful celiac, common hepatic, gastroduodenal angiograms. 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 Electronically Signed   By: Jerilynn Mages.  Shick M.D.   On: 08/14/2016 12:47   Ir Angiogram Selective Each Additional Vessel  Result Date: 08/14/2016 INDICATION: Known active duodenal ulcer, status post endoscopic epi injection, cautery and endoscopic clipping. Recurrent GI bleeding with acute bright red blood per rectum. EXAM: SELECTIVE VISCERAL ARTERIOGRAPHY; ADDITIONAL ARTERIOGRAPHY; IR ULTRASOUND GUIDANCE VASC ACCESS RIGHT; IR EMBO ART VEN HEMORR LYMPH EXTRAV INC GUIDE ROADMAPPING; ARTERIOGRAPHY MEDICATIONS: None. ANESTHESIA/SEDATION: Moderate (conscious) sedation was employed during this procedure. A total of Versed 6.0 mg and Fentanyl 250 mcg was administered intravenously. Moderate Sedation Time: 70 Minutes. The patient's level of consciousness and vital signs were monitored continuously by radiology nursing throughout the procedure under my direct supervision. CONTRAST:  51m ISOVUE-300 IOPAMIDOL (ISOVUE-300) INJECTION 61%, 513mISOVUE-300 IOPAMIDOL (ISOVUE-300) INJECTION 61%, 10107mISOVUE-300 IOPAMIDOL (ISOVUE-300) INJECTION 61% FLUOROSCOPY TIME:  Fluoroscopy Time: 28 minutes 48 seconds (1,118 mGy). COMPLICATIONS: None immediate. PROCEDURE: Informed consent was obtained from the patient following explanation of the procedure, risks, benefits and alternatives. The patient understands, agrees and consents for the procedure. All questions were addressed. A time out was performed prior to the initiation of the procedure. Maximal barrier sterile technique utilized including caps, mask, sterile gowns, sterile gloves, large sterile drape, hand hygiene, and Betadine prep. Previous imaging and endoscopic pictures reviewed. Under sterile conditions and local anesthesia, ultrasound micropuncture access performed of the right common femoral artery. Five French sheath inserted. C2 catheter utilized to select the celiac origin. Selective celiac angiogram performed. Celiac: Celiac origin is widely patent. Splenic and hepatic vasculature are patent. Over a Glidewire, the C2 catheter was advanced into the common hepatic artery. Common hepatic angiogram performed. Common hepatic: Common hepatic, GDA, proper hepatic, right hepatic and left hepatic arteries are all patent. Lantern microcatheter utilized over a fathom guidewire to eventually selected the gastroduodenal artery. Selected GDA angiogram performed. GDA: Gastroduodenal artery is patent as well as the gastroepiploic artery. 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 GDA embolization: Through the microcatheter, 3 mm x 30 mm (2) and 3 mm right 20 mm(1) Ruby micro coils were deployed within the GDA occluding the mid GDA pseudoaneurysm. Following embolization, final angiogram confirms occlusion of the GDA with no longer visualization of the pseudoaneurysm. Repeat common hepatic angiogram confirms patency of the hepatic vasculature. Access removed. Hemostasis obtained with a Exoseal device. No immediate complication. Patient tolerated  the procedure well. IMPRESSION: Successful celiac, common hepatic, gastroduodenal angiograms. 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 Electronically Signed   By: M. Jerilynn MagesShick M.D.   On: 08/14/2016 12:47   Ir Angiogram  Follow Up Study  Result Date: 08/14/2016 INDICATION: Known active duodenal ulcer, status post endoscopic epi injection, cautery and endoscopic clipping. Recurrent GI bleeding with acute bright red blood per rectum. EXAM: SELECTIVE VISCERAL ARTERIOGRAPHY; ADDITIONAL ARTERIOGRAPHY; IR ULTRASOUND GUIDANCE VASC ACCESS RIGHT; IR EMBO ART VEN HEMORR LYMPH EXTRAV INC GUIDE ROADMAPPING; ARTERIOGRAPHY MEDICATIONS: None. ANESTHESIA/SEDATION: Moderate (conscious) sedation was employed during this procedure. A total of Versed 6.0 mg and Fentanyl 250 mcg was administered intravenously. Moderate Sedation Time: 70 Minutes. The patient's level of consciousness and vital signs were monitored continuously by radiology nursing throughout the procedure under my direct supervision. CONTRAST:  92m ISOVUE-300 IOPAMIDOL (ISOVUE-300) INJECTION 61%, 519mISOVUE-300 IOPAMIDOL (ISOVUE-300) INJECTION 61%, 1042mSOVUE-300 IOPAMIDOL (ISOVUE-300) INJECTION 61% FLUOROSCOPY TIME:  Fluoroscopy Time: 28 minutes 48 seconds (1,118 mGy). COMPLICATIONS: None immediate. PROCEDURE: Informed consent was obtained from the patient following explanation of the procedure, risks, benefits and alternatives. The patient understands, agrees and consents for the procedure. All questions were addressed. A time out was performed prior to the initiation of the procedure. Maximal barrier sterile technique utilized including caps, mask, sterile gowns, sterile gloves, large sterile drape, hand hygiene, and Betadine prep. Previous imaging and endoscopic pictures reviewed. Under sterile conditions and local anesthesia, ultrasound micropuncture access performed of the right common femoral artery.  Five French sheath inserted. C2 catheter utilized to select the celiac origin. Selective celiac angiogram performed. Celiac: Celiac origin is widely patent. Splenic and hepatic vasculature are patent. Over a Glidewire, the C2 catheter was advanced into the common hepatic artery. Common hepatic angiogram performed. Common hepatic: Common hepatic, GDA, proper hepatic, right hepatic and left hepatic arteries are all patent. Lantern microcatheter utilized over a fathom guidewire to eventually selected the gastroduodenal artery. Selected GDA angiogram performed. GDA: Gastroduodenal artery is patent as well as the gastroepiploic artery. 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 GDA embolization: Through the microcatheter, 3 mm x 30 mm (2) and 3 mm right 20 mm(1) Ruby micro coils were deployed within the GDA occluding the mid GDA pseudoaneurysm. Following embolization, final angiogram confirms occlusion of the GDA with no longer visualization of the pseudoaneurysm. Repeat common hepatic angiogram confirms patency of the hepatic vasculature. Access removed. Hemostasis obtained with a Exoseal device. No immediate complication. Patient tolerated the procedure well. IMPRESSION: Successful celiac, common hepatic, gastroduodenal angiograms. 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 Electronically Signed   By: M. Jerilynn MagesShick M.D.   On: 08/14/2016 12:47   Ir Us [place] Vasc Access Right  Result Date: 08/14/2016 INDICATION: Known active duodenal ulcer, status post endoscopic epi injection, cautery and endoscopic clipping. Recurrent GI bleeding with acute bright red blood per rectum. EXAM: SELECTIVE VISCERAL ARTERIOGRAPHY; ADDITIONAL ARTERIOGRAPHY; IR ULTRASOUND GUIDANCE VASC ACCESS RIGHT; IR EMBO ART VEN HEMORR LYMPH EXTRAV INC GUIDE ROADMAPPING; ARTERIOGRAPHY MEDICATIONS: None. ANESTHESIA/SEDATION: Moderate (conscious) sedation was employed during  this procedure. A total of Versed 6.0 mg and Fentanyl 250 mcg was administered intravenously. Moderate Sedation Time: 70 Minutes. The patient's level of consciousness and vital signs were monitored continuously by radiology nursing throughout the procedure under my direct supervision. CONTRAST:  6m80mOVUE-300 IOPAMIDOL (ISOVUE-300) INJECTION 61%, 6mL59mVUE-300 IOPAMIDOL (ISOVUE-300) INJECTION 61%, 10mL 99mUE-300 IOPAMIDOL (ISOVUE-300) INJECTION 61% FLUOROSCOPY TIME:  Fluoroscopy Time: 28 minutes 48 seconds (1,118 mGy). COMPLICATIONS: None immediate. PROCEDURE: Informed consent was obtained from the patient following explanation of the procedure, risks, benefits and alternatives. The patient understands, agrees and consents for the procedure. All  questions were addressed. A time out was performed prior to the initiation of the procedure. Maximal barrier sterile technique utilized including caps, mask, sterile gowns, sterile gloves, large sterile drape, hand hygiene, and Betadine prep. Previous imaging and endoscopic pictures reviewed. Under sterile conditions and local anesthesia, ultrasound micropuncture access performed of the right common femoral artery. Five French sheath inserted. C2 catheter utilized to select the celiac origin. Selective celiac angiogram performed. Celiac: Celiac origin is widely patent. Splenic and hepatic vasculature are patent. Over a Glidewire, the C2 catheter was advanced into the common hepatic artery. Common hepatic angiogram performed. Common hepatic: Common hepatic, GDA, proper hepatic, right hepatic and left hepatic arteries are all patent. Lantern microcatheter utilized over a fathom guidewire to eventually selected the gastroduodenal artery. Selected GDA angiogram performed. GDA: Gastroduodenal artery is patent as well as the gastroepiploic artery. 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 GDA embolization: Through the microcatheter, 3 mm x 30 mm (2)  and 3 mm right 20 mm(1) Ruby micro coils were deployed within the GDA occluding the mid GDA pseudoaneurysm. Following embolization, final angiogram confirms occlusion of the GDA with no longer visualization of the pseudoaneurysm. Repeat common hepatic angiogram confirms patency of the hepatic vasculature. Access removed. Hemostasis obtained with a Exoseal device. No immediate complication. Patient tolerated the procedure well. IMPRESSION: Successful celiac, common hepatic, gastroduodenal angiograms. 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 Electronically Signed   By: Jerilynn Mages.  Shick M.D.   On: 08/14/2016 12:47   Dg Chest Port 1 View  Result Date: 08/14/2016 CLINICAL DATA:  61 y/o  M; shortness of breath and congestion today. EXAM: PORTABLE CHEST 1 VIEW COMPARISON:  08/12/2016 chest radiograph. FINDINGS: Stable cardiac silhouette within normal limits. Interval development of diffuse hazy opacities of the lungs. Possible trace bilateral pleural effusions. Bones are unremarkable. IMPRESSION: Interval development of diffuse hazy opacities of the lungs probably represents pulmonary edema. Possible trace bilateral pleural effusions. Electronically Signed   By: Kristine Garbe M.D.   On: 08/14/2016 14:36   Dg Chest Port 1 View  Result Date: 08/12/2016 CLINICAL DATA:  Intubation. EXAM: PORTABLE CHEST 1 VIEW COMPARISON:  08/10/2016. FINDINGS: Interim removal of NG tube. Endotracheal tube and right IJ line stable position. Heart size stable. Low lung volumes with mild basilar atelectasis. Persistent left base infiltrate. Tiny left pleural effusion. No pneumothorax. IMPRESSION: 1. An removal of NG tube. Endotracheal tube and right IJ line stable position. 2. Low lung volumes. Persistent left base infiltrate suggesting pneumonia. Small left pleural effusion. Electronically Signed   By: Marcello Moores  Register   On: 08/12/2016 06:58   Dg Chest Portable 1  View  Result Date: 08/10/2016 CLINICAL DATA:  61 year old male status post central line placement. EXAM: PORTABLE CHEST 1 VIEW COMPARISON:  Chest x-ray 08/10/2014. FINDINGS: New right IJ central line with tip terminating in the mid superior vena cava. A nasogastric tube is seen extending into the stomach, however, the tip of the nasogastric tube extends below the lower margin of the image. An endotracheal tube is in place with tip 4.8 cm above the carina. Low lung volumes. Ill-defined opacity in the left lung base partially obscuring the left hemidiaphragm, concerning for airspace consolidation. Right lung appears clear. No pleural effusions. No evidence pulmonary edema. No pneumothorax. Heart size is borderline enlarged. Upper mediastinal contours are within normal limits. IMPRESSION: 1. Support apparatus, as above. 2. Low lung volumes with probable airspace consolidation in the left lower  lobe which may reflect pneumonia or sequela of aspiration. Electronically Signed   By: Vinnie Langton M.D.   On: 08/10/2016 16:00   Dg Chest Portable 1 View  Result Date: 08/10/2016 CLINICAL DATA:  Status post intubation today. EXAM: PORTABLE CHEST 1 VIEW COMPARISON:  PA and lateral chest 05/09/2011. FINDINGS: Endotracheal tube is in place with tip in good position at the level of the clavicular heads. NG tube courses into the stomach. Side port is in the distal esophagus. The tube should be advanced 5 cm for better positioning. Mild left basilar atelectasis is seen. Lungs are otherwise clear. No pneumothorax or pleural effusion. IMPRESSION: ETT in good position. NG tube should be advanced approximately 5 cm for better positioning. Subsegmental atelectasis left lung base. Electronically Signed   By: Inge Rise M.D.   On: 08/10/2016 15:28   [place] Guide Roadmapping  Result Date: 08/14/2016 INDICATION: Known active duodenal ulcer, status post endoscopic epi injection, cautery and  endoscopic clipping. Recurrent GI bleeding with acute bright red blood per rectum. EXAM: SELECTIVE VISCERAL ARTERIOGRAPHY; ADDITIONAL ARTERIOGRAPHY; IR ULTRASOUND GUIDANCE VASC ACCESS RIGHT; IR EMBO ART VEN HEMORR LYMPH EXTRAV INC GUIDE ROADMAPPING; ARTERIOGRAPHY MEDICATIONS: None. ANESTHESIA/SEDATION: Moderate (conscious) sedation was employed during this procedure. A total of Versed 6.0 mg and Fentanyl 250 mcg was administered intravenously. Moderate Sedation Time: 70 Minutes. The patient's level of consciousness and vital signs were monitored continuously by radiology nursing throughout the procedure under my direct supervision. CONTRAST:  60m ISOVUE-300 IOPAMIDOL (ISOVUE-300) INJECTION 61%, 588mISOVUE-300 IOPAMIDOL (ISOVUE-300) INJECTION 61%, 1038mSOVUE-300 IOPAMIDOL (ISOVUE-300) INJECTION 61% FLUOROSCOPY TIME:  Fluoroscopy Time: 28 minutes 48 seconds (1,118 mGy). COMPLICATIONS: None immediate. PROCEDURE: Informed consent was obtained from the patient following explanation of the procedure, risks, benefits and alternatives. The patient understands, agrees and consents for the procedure. All questions were addressed. A time out was performed prior to the initiation of the procedure. Maximal barrier sterile technique utilized including caps, mask, sterile gowns, sterile gloves, large sterile drape, hand hygiene, and Betadine prep. Previous imaging and endoscopic pictures reviewed. Under sterile conditions and local anesthesia, ultrasound micropuncture access performed of the right common femoral artery. Five French sheath inserted. C2 catheter utilized to select the celiac origin. Selective celiac angiogram performed. Celiac: Celiac origin is widely patent. Splenic and hepatic vasculature are patent. Over a Glidewire, the C2 catheter was advanced into the common hepatic artery. Common hepatic angiogram performed. Common hepatic: Common hepatic, GDA, proper hepatic, right hepatic and left hepatic arteries are  all patent. Lantern microcatheter utilized over a fathom guidewire to eventually selected the gastroduodenal artery. Selected GDA angiogram performed. GDA: Gastroduodenal artery is patent as well as the gastroepiploic artery. 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 GDA embolization: Through the microcatheter, 3 mm x 30 mm (2) and 3 mm right 20 mm(1) Ruby micro coils were deployed within the GDA occluding the mid GDA pseudoaneurysm. Following embolization, final angiogram confirms occlusion of the GDA with no longer visualization of the pseudoaneurysm. Repeat common hepatic angiogram confirms patency of the hepatic vasculature. Access removed. Hemostasis obtained with a Exoseal device. No immediate complication. Patient tolerated the procedure well. IMPRESSION: Successful celiac, common hepatic, gastroduodenal angiograms. 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 Electronically Signed   By: M. Jerilynn MagesShick M.D.   On: 08/14/2016 12:47    Microbiology: Recent Results (from the past 240 hour(s))  Blood culture (routine  x 2)     Status: Abnormal   Collection Time: 08/10/16  3:30 PM  Result Value Ref Range Status   Specimen Description BLOOD RIGHT ANTECUBITAL  Final   Special Requests BOTTLES DRAWN AEROBIC AND ANAEROBIC 5 CC  Final   Culture  Setup Time   Final    GRAM POSITIVE RODS AEROBIC BOTTLE ONLY CRITICAL RESULT CALLED TO, READ BACK BY AND VERIFIED WITH: AElta Guadeloupe.D. 10:20 08/13/16 (wilsonm)    Culture (A)  Final    DIPHTHEROIDS(CORYNEBACTERIUM SPECIES) Standardized susceptibility testing for this organism is not available.    Report Status 08/14/2016 FINAL  Final  Blood Culture ID Panel (Reflexed)     Status: None   Collection Time: 08/10/16  3:30 PM  Result Value Ref Range Status   Enterococcus species NOT DETECTED NOT DETECTED Final   Listeria monocytogenes NOT DETECTED NOT DETECTED Final    Staphylococcus species NOT DETECTED NOT DETECTED Final   Staphylococcus aureus NOT DETECTED NOT DETECTED Final   Streptococcus species NOT DETECTED NOT DETECTED Final   Streptococcus agalactiae NOT DETECTED NOT DETECTED Final   Streptococcus pneumoniae NOT DETECTED NOT DETECTED Final   Streptococcus pyogenes NOT DETECTED NOT DETECTED Final   Acinetobacter baumannii NOT DETECTED NOT DETECTED Final   Enterobacteriaceae species NOT DETECTED NOT DETECTED Final   Enterobacter cloacae complex NOT DETECTED NOT DETECTED Final   Escherichia coli NOT DETECTED NOT DETECTED Final   Klebsiella oxytoca NOT DETECTED NOT DETECTED Final   Klebsiella pneumoniae NOT DETECTED NOT DETECTED Final   Proteus species NOT DETECTED NOT DETECTED Final   Serratia marcescens NOT DETECTED NOT DETECTED Final   Haemophilus influenzae NOT DETECTED NOT DETECTED Final   Neisseria meningitidis NOT DETECTED NOT DETECTED Final   Pseudomonas aeruginosa NOT DETECTED NOT DETECTED Final   Candida albicans NOT DETECTED NOT DETECTED Final   Candida glabrata NOT DETECTED NOT DETECTED Final   Candida krusei NOT DETECTED NOT DETECTED Final   Candida parapsilosis NOT DETECTED NOT DETECTED Final   Candida tropicalis NOT DETECTED NOT DETECTED Final  Culture, blood (Routine X 2) w Reflex to ID Panel     Status: None   Collection Time: 08/10/16  5:00 PM  Result Value Ref Range Status   Specimen Description BLOOD RIGHT JUGLAR  Final   Special Requests BOTTLES DRAWN AEROBIC AND ANAEROBIC 5CC  Final   Culture NO GROWTH 5 DAYS  Final   Report Status 08/15/2016 FINAL  Final  MRSA PCR Screening     Status: None   Collection Time: 08/10/16  7:08 PM  Result Value Ref Range Status   MRSA by PCR NEGATIVE NEGATIVE Final    Comment:        The GeneXpert MRSA Assay (FDA approved for NASAL specimens only), is one component of a comprehensive MRSA colonization surveillance program. It is not intended to diagnose MRSA infection nor to guide  or monitor treatment for MRSA infections.      Labs: Basic Metabolic Panel:  Recent Labs Lab 08/15/16 0644 08/16/16 0446 08/17/16 0509 08/18/16 1048 08/19/16 0508  NA 142 140 137 139 139  K 3.9 3.7 3.7 4.3 4.1  CL 110 107 105 103 105  CO2 '25 26 25 25 28  ' GLUCOSE 96 96 96 99 85  BUN '13 10 9 17 16  ' CREATININE 1.06 1.10 1.12 1.22 1.19  CALCIUM 8.2* 8.1* 8.3* 8.7* 8.6*   Liver Function Tests: No results for input(s): AST, ALT, ALKPHOS, BILITOT, PROT, ALBUMIN in the last 168  hours. No results for input(s): LIPASE, AMYLASE in the last 168 hours. No results for input(s): AMMONIA in the last 168 hours. CBC:  Recent Labs Lab 08/15/16 1647 08/16/16 0851 08/17/16 0509 08/18/16 1048 08/19/16 0508  WBC 8.7 8.4 8.2 8.0 6.9  HGB 8.1* 8.1* 8.3* 9.1* 8.0*  HCT 24.6* 25.8* 25.6* 28.7* 26.1*  MCV 85.4 84.9 85.3 87.0 87.9  PLT 169 229 242 362 381   Cardiac Enzymes: No results for input(s): CKTOTAL, CKMB, CKMBINDEX, TROPONINI in the last 168 hours. BNP: BNP (last 3 results) No results for input(s): BNP in the last 8760 hours.  ProBNP (last 3 results) No results for input(s): PROBNP in the last 8760 hours.  CBG:  Recent Labs Lab 08/18/16 1722 08/18/16 2014 08/19/16 0010 08/19/16 0413 08/19/16 0751  GLUCAP 116* 110* 114* 93 99       Signed:  Zalea Pete MD.  [facility] 08/19/2016, 10:47 AM

## 2016-08-19 NOTE — Clinical Social Work Note (Signed)
Pt is ready for discharge today and will go to [facility]. Discharge is pending verification of Medicare benefits by facility. Name maybe incorrect, however a copy of the card was found in chart review from 2011. CSW has attempted several times to reach facility to confirm name and number. CSW updated 2nd shift ED CSW of matter under the guidance of the Asst Director. RN and MD have been updated as well. CSW will continue to follow if pt is unable to discharge this evening.   Dede QuerySarah Delita Chiquito, MSW, LCSW  Clinical Social Worker  [phone]

## 2016-08-19 NOTE — Progress Notes (Signed)
CS[facility] [place]. Olegario MessierKathy stated patient is able to come to facility; however they needed an updated Medicare Card due to not being able to confirm insurance. Olegario MessierKathy said they would be unable to take patient until the Medicare Card information is given to their facility. CSW will Actuaryupdate assistant director and RN.   Stacy GardnerErin Zanden Colver, LCSWA Clinical Social Worker [phone]

## 2016-08-19 NOTE — Progress Notes (Signed)
CSW attempted to contact patients mother, Corrie DandyMary, with no answer. CSW will call back at a later time. CSW also attempted to contact patients nephew, Anthony,regarding medicare card; however, Ethelene Brownsnthony was unavailable at the time. CSW left phone number for return call.   Stacy GardnerErin Kabrina Christiano, LCSWA Clinical Social Worker [phone]

## 2016-08-19 NOTE — Care Management Important Message (Signed)
Important Message  Patient Details  Name: Johnny [name] MRN: [id number] Date of Birth: 06/25/1956   Medicare Important Message Given:  Yes    Elnoria Livingston Stefan ChurchBratton 08/19/2016, 4:51 PM

## 2016-08-19 NOTE — NC FL2 (Signed)
[place] MEDICAID FL2 LEVEL OF CARE SCREENING TOOL     IDENTIFICATION  Patient Name: Johnny [name] Birthdate: 05/11/1956 Sex: male Admission Date (Current Location): 08/10/2016  [facility] [place]   Facility and Address:  The . [facility], [address], [place], [place] [address]      Provider Number: [id number]  Attending Physician Name and Address:  Zannie CovePreetha Joseph, MD  Relative Name and Phone Number:  Robynn PaneMary Delpino (Mother) [phone]    Current Level of Care: Hospital Recommended Level of Care: Skilled Nursing Facility Prior Approval Number:    Date Approved/Denied:   PASRR Number: [id number]  Discharge Plan: SNF    Current Diagnoses: Patient Active Problem List   Diagnosis Date Noted  . Acute respiratory failure with hypoxia (HCC)   . Essential hypertension   . ARF (acute renal failure) (HCC)   . Encephalopathy   . Acute duodenal ulcer with hemorrhage   . Ulcer of esophagus without bleeding   . Acute upper GI bleeding   . Gastrointestinal hemorrhage with hematemesis 08/10/2016  . Acute blood loss anemia     Orientation RESPIRATION BLADDER Height & Weight     Self, Time, Situation, Place  Normal Continent Weight: 214 lb 1.1 oz (97.1 kg) Height:  6\' 1"  (185.4 cm)  BEHAVIORAL SYMPTOMS/MOOD NEUROLOGICAL BOWEL NUTRITION STATUS   (NONE)   Continent Diet (Regular Diet)  AMBULATORY STATUS COMMUNICATION OF NEEDS Skin   Extensive Assist Verbally Normal                       Personal Care Assistance Level of Assistance  Bathing, Feeding, Dressing Bathing Assistance: Limited assistance Feeding assistance: Independent Dressing Assistance: Limited assistance     Functional Limitations Info  Sight, Hearing, Speech Sight Info: Adequate Hearing Info: Adequate Speech Info: Adequate    SPECIAL CARE FACTORS FREQUENCY  PT (By licensed PT), OT (By licensed OT)     PT Frequency: 5 OT Frequency: 5             Contractures Contractures Info: Not present    Additional Factors Info  Code Status, Allergies Code Status Info: Full Code Allergies Info: ketamine           Current Medications (08/19/2016):  This is the current hospital active medication list Current Facility-Administered Medications  Medication Dose Route Frequency Provider Last Rate Last Dose  . enoxaparin (LOVENOX) injection 40 mg  40 mg Subcutaneous Q24H Zannie CovePreetha Joseph, MD   40 mg at 08/19/16 0949  . folic acid (FOLVITE) tablet 1 mg  1 mg Oral Daily Alexa R Burns, MD   1 mg at 08/19/16 0949  . hydrALAZINE (APRESOLINE) injection 10-40 mg  10-40 mg Intravenous Q4H PRN Oretha Milchakesh V Alva, MD      . hydrochlorothiazide (HYDRODIURIL) tablet 25 mg  25 mg Oral Daily Arnetha CourserSumayya Amin, MD   25 mg at 08/19/16 0949  . MEDLINE mouth rinse  15 mL Mouth Rinse BID Nelda Bucksaniel J Feinstein, MD   15 mL at 08/19/16 1000  . morphine 2 MG/ML injection 2 mg  2 mg Intravenous Q4H PRN Reymundo Pollarolyn Guilloud, MD   2 mg at 08/16/16 0139  . ondansetron (ZOFRAN) injection 4 mg  4 mg Intravenous Q6H PRN Su Hoffarly J Rivet, MD   4 mg at 08/14/16 0418  . oxyCODONE (Oxy IR/ROXICODONE) immediate release tablet 5 mg  5 mg Oral Q4H PRN Arnetha CourserSumayya Amin, MD   5 mg at 08/19/16 0948  .  pantoprazole (PROTONIX) EC tablet 40 mg  40 mg Oral BID Beverley Fiedler, MD   40 mg at 08/19/16 0948  . polyethylene glycol (MIRALAX / GLYCOLAX) packet 17 g  17 g Oral Daily Zannie Cove, MD   17 g at 08/19/16 0949  . simethicone (MYLICON) chewable tablet 80 mg  80 mg Oral QID PRN Edson Snowball, PA-C   80 mg at 08/16/16 1759  . thiamine (VITAMIN B-1) tablet 100 mg  100 mg Oral Daily Alexa Lucrezia Starch, MD   100 mg at 08/19/16 1610     Discharge Medications: Please see discharge summary for a list of discharge medications.  Relevant Imaging Results:  Relevant Lab Results:   Additional Information SSN:  [id number]  Dede Query, LCSW

## 2016-08-19 NOTE — Progress Notes (Signed)
Physical Therapy Treatment Patient Details Name: Johnny [name] MRN: [id number] DOB: 03/10/1956 Today's Date: 08/19/2016    History of Present Illness 61 yr old with PMHx of drug abuse with cocaine and etoh presented on 1/6 was found down with hematochezia and found to have an UGIB 2/2 bleeding duodenal ulcer. . H/o broken neck 1996.    PT Comments    Pt making steady progress towards goals. Able to ambulate 50 ft today with min assist for safety and to maintain balance. Pt demonstrates forward lean with ambulation and difficulty clearing feet. Also lacks heelstrike bilaterally. VCs to push down through arms to assist with ambulation. Poor balance throughout as pt was heavily reliant on RW to maintain standing balance and also demonstrated significant posterior lean upon sitting EOB. Pt continues to be a good candidate for SNF to address deficits in balance and activity tolerance. PT will continue to follow acutely.   Follow Up Recommendations  SNF;Supervision/Assistance - 24 hour     Equipment Recommendations  3in1 (PT)    Recommendations for Other Services       Precautions / Restrictions Precautions Precautions: Fall Precaution Comments: hold pressure on right groin with mobility Restrictions Weight Bearing Restrictions: No    Mobility  Bed Mobility Overal bed mobility: Needs Assistance Bed Mobility: Supine to Sit Rolling: Min guard   Supine to sit: Min assist     General bed mobility comments: Min A to upright trunk supine<>sit. Difficulty maintaining balance once sitting EOB. VCs for hand placement. Use of bed rails. HOB elevated  Transfers Overall transfer level: Needs assistance Equipment used: Rolling walker (2 wheeled) Transfers: Sit to/from Stand Sit to Stand: Mod assist;From elevated surface         General transfer comment: Pt able to stand with mod A to power to stand from elevated bed. Difficulty maintaining balance once standing. Requires extra  time.  Ambulation/Gait Ambulation/Gait assistance: Min assist;+2 safety/equipment (chair follow) Ambulation Distance (Feet): 50 Feet Assistive device: Rolling walker (2 wheeled) Gait Pattern/deviations: Step-to pattern;Decreased stance time - right;Trunk flexed;Shuffle;Decreased stride length Gait velocity: decreased Gait velocity interpretation: Below normal speed for age/gender General Gait Details: Pt with maintained R hip and knee flexion and R foot drop (present at baseline 3 months before admission). Pt with decreased heelstrike bilaterally and demonstrates difficulty clearing feet with swing phase of gait.    Stairs            Wheelchair Mobility    Modified Rankin (Stroke Patients Only)       Balance Overall balance assessment: Needs assistance Sitting-balance support: Feet supported;No upper extremity supported Sitting balance-Leahy Scale: Fair   Postural control: Posterior lean Standing balance support: Bilateral upper extremity supported Standing balance-Leahy Scale: Poor Standing balance comment: Reliant of BUE to maintain standing balance                    Cognition Arousal/Alertness: Awake/alert Behavior During Therapy: WFL for tasks assessed/performed Overall Cognitive Status: No family/caregiver present to determine baseline cognitive functioning                      Exercises      General Comments        Pertinent Vitals/Pain Pain Assessment: No/denies pain    Home Living                      Prior Function            PT  Goals (current goals can now be found in the care plan section) Acute Rehab PT Goals Patient Stated Goal: to go home PT Goal Formulation: With patient Time For Goal Achievement: 08/27/16 Potential to Achieve Goals: Good Progress towards PT goals: Progressing toward goals    Frequency    Min 3X/week      PT Plan Current plan remains appropriate    Co-evaluation              End of Session Equipment Utilized During Treatment: Gait belt Activity Tolerance: Patient tolerated treatment well Patient left: in chair;with call bell/phone within reach     Time: 1012-1038 PT Time Calculation (min) (ACUTE ONLY): 26 min  Charges:  $Gait Training: 23-37 mins                    G Codes:      Gaye PollackRebecca Sussan Meter 08/19/2016, 1:27 PM Gaye Pollackebecca Omarrion Carmer, SPT [phone]

## 2016-08-20 LAB — GLUCOSE, CAPILLARY
GLUCOSE-CAPILLARY: 109 mg/dL — AB (ref 65–99)
GLUCOSE-CAPILLARY: 89 mg/dL (ref 65–99)
Glucose-Capillary: 100 mg/dL — ABNORMAL HIGH (ref 65–99)
Glucose-Capillary: 100 mg/dL — ABNORMAL HIGH (ref 65–99)
Glucose-Capillary: 128 mg/dL — ABNORMAL HIGH (ref 65–99)

## 2016-08-20 NOTE — Clinical Social Work Note (Signed)
Pt unsafe to return home per MD. CSW will follow with Asst Director for placement options. SNFs not able to verify Medicare, even thought pt confirmed it was valid with the [facility]SA. CSW will follow up with finacial counseling for verification from hospital standpoint. CSW will continue to follow.   Dede QuerySarah Cortlin Marano, MSW, LCSW  Clinical Social Worker  [phone]

## 2016-08-21 LAB — GLUCOSE, CAPILLARY
GLUCOSE-CAPILLARY: 103 mg/dL — AB (ref 65–99)
GLUCOSE-CAPILLARY: 83 mg/dL (ref 65–99)
Glucose-Capillary: 97 mg/dL (ref 65–99)
Glucose-Capillary: 116 mg/dL — ABNORMAL HIGH (ref 65–99)

## 2016-08-21 NOTE — Progress Notes (Signed)
Report called to [facility]. Patient ready for  Discharge. Waiting on transportation

## 2016-08-21 NOTE — Progress Notes (Signed)
Physical Therapy Treatment Patient Details Name: Johnny [name] MRN: [id number] DOB: 1956-02-18 Today's Date: 08/21/2016    History of Present Illness 61 yr old with PMHx of drug abuse with cocaine and etoh presented on 1/6 was found down with hematochezia and found to have an UGIB 2/2 bleeding duodenal ulcer. . H/o broken neck 1996.    PT Comments    Pt admitted with above diagnosis. Pt currently with functional limitations due to balance and endurance deficits. Pt was able to ambulate in hall.  Still needing min assist and mod cues for safety with RW.  Pt able to judge distance back to room today but did need assist to control descent into chair as pt exhausted.  Pt also needs incr assist with sit to stand needing mod assist for stability and power up.  Pt will benefit from skilled PT to increase their independence and safety with mobility to allow discharge to the venue listed below.    Follow Up Recommendations  SNF;Supervision/Assistance - 24 hour     Equipment Recommendations  3in1 (PT)    Recommendations for Other Services       Precautions / Restrictions Precautions Precautions: Fall Restrictions Weight Bearing Restrictions: No    Mobility  Bed Mobility Overal bed mobility: Needs Assistance Bed Mobility: Supine to Sit Rolling: Min guard   Supine to sit: Min assist     General bed mobility comments: needed assist to come to EOB.  Uses momentum to come to sitting and to scoot out.  Somewhat impulsive.   Transfers Overall transfer level: Needs assistance Equipment used: Rolling walker (2 wheeled) Transfers: Sit to/from Stand Sit to Stand: Mod assist;+2 physical assistance         General transfer comment: Pt able to stand with mod A to power to stand from lower surface. Difficulty maintaining balance once standing. Requires extra time to steady himself as well as cues.  Ambulation/Gait Ambulation/Gait assistance: Min assist;+2 safety/equipment (chair  follow) Ambulation Distance (Feet): 110 Feet Assistive device: Rolling walker (2 wheeled) Gait Pattern/deviations: Step-to pattern;Decreased stance time - right;Trunk flexed;Shuffle;Decreased stride length Gait velocity: decreased Gait velocity interpretation: Below normal speed for age/gender General Gait Details: Pt with maintained R hip and knee flexion and R foot drop (present at baseline 3 months before admission). Pt with decreased heelstrike bilaterally and demonstrates difficulty clearing feet with swing phase of gait.  Pt with notable weakness in right LE as he fatigues cuing pt at times to pick LEs up more.  Pt also needed cues to stand tall as this helped with his stablity.  Pt was able to judge distance and was able to ambualte in hall and back to room even though PT had followed with the chair.  Pt had an uncontrolled descent into the chair however once back to room as he was fatigued.     Stairs            Wheelchair Mobility    Modified Rankin (Stroke Patients Only)       Balance Overall balance assessment: Needs assistance Sitting-balance support: Feet supported;No upper extremity supported Sitting balance-Leahy Scale: Fair Sitting balance - Comments: can sit EOB unsupported   Standing balance support: Bilateral upper extremity supported Standing balance-Leahy Scale: Poor Standing balance comment: Reliant of BUE to maintain standing balance with postural issues.             High level balance activites: Turns High Level Balance Comments: Pt was able to turn but needed incr cues for  safety.      Cognition Arousal/Alertness: Awake/alert Behavior During Therapy: WFL for tasks assessed/performed Overall Cognitive Status: No family/caregiver present to determine baseline cognitive functioning Area of Impairment: Safety/judgement;Problem solving Orientation Level: Disoriented to;Time;Situation     Following Commands: Follows one step commands with increased  time;Follows one step commands inconsistently Safety/Judgement: Decreased awareness of safety;Decreased awareness of deficits Awareness: Intellectual Problem Solving: Slow processing;Decreased initiation;Difficulty sequencing;Requires verbal cues;Requires tactile cues      Exercises Other Exercises Other Exercises: Right LE stretch with sheet given to pt.  Pt going to perform exercise several times a day to stretch.      General Comments        Pertinent Vitals/Pain Pain Assessment: No/denies pain  VSS    Home Living                      Prior Function            PT Goals (current goals can now be found in the care plan section) Acute Rehab PT Goals Patient Stated Goal: to go home Progress towards PT goals: Progressing toward goals    Frequency    Min 3X/week      PT Plan Current plan remains appropriate    Co-evaluation             End of Session Equipment Utilized During Treatment: Gait belt Activity Tolerance: Patient tolerated treatment well Patient left: in chair;with call bell/phone within reach     Time: 1015-1031 PT Time Calculation (min) (ACUTE ONLY): 16 min  Charges:  $Gait Training: 8-22 mins                    G Codes:      [name] [name] [name] 22-Aug-2016, 11:51 AM  [name] [phone] (pager)

## 2016-08-21 NOTE — Progress Notes (Signed)
Pt seen and examined, no changes from DC summary 1/15, awaiting authentification of Medicare card for SNF  Zannie CovePreetha Ralphine Hinks, MD

## 2016-08-21 NOTE — Progress Notes (Signed)
OT Progress Note - late entry    08/20/16 1445  OT Visit Information  Last OT Received On 08/20/16  Assistance Needed +2 (for ambulation)  History of Present Illness 61 yr old with PMHx of drug abuse with cocaine and etoh presented on 1/6 was found down with hematochezia and found to have an UGIB 2/2 bleeding duodenal ulcer. . H/o broken neck 1996.  Precautions  Precautions Fall  Pain Assessment  Pain Assessment No/denies pain  Cognition  Arousal/Alertness Awake/alert  Behavior During Therapy WFL for tasks assessed/performed  Overall Cognitive Status No family/caregiver present to determine baseline cognitive functioning  ADL  Overall ADL's  Needs assistance/impaired  Lower Body Bathing Maximal assistance;Sit to/from stand  Lower Body Dressing Maximal assistance  Lower Body Dressing Details (indicate cue type and reason) Pt able to donn sock with use of sock aid with min A  Functional mobility during ADLs Minimal assistance;Rolling walker  General ADL Comments Educated pt on use of AE for LB ADL. Pt verbalized understnading. Will need more practive.  Bed Mobility  General bed mobility comments OOB in chair  Balance  Overall balance assessment Needs assistance  Standing balance-Leahy Scale Poor  Standing balance comment Reliant of BUE to maintain standing balance  Transfers  Overall transfer level Needs assistance  Equipment used Rolling walker (2 wheeled)  Transfers Sit to/from Stand  Sit to Stand Min assist;From elevated surface  OT - End of Session  Equipment Utilized During Treatment Gait belt;Rolling walker  Activity Tolerance Patient tolerated treatment well  Patient left in chair;with call bell/phone within reach  Nurse Communication Mobility status;Other (comment)  OT Assessment/Plan  OT Plan Discharge plan remains appropriate  OT Frequency (ACUTE ONLY) Min 2X/week  Follow Up Recommendations SNF;Supervision/Assistance - 24 hour  OT Equipment 3 in 1 bedside  commode;Tub/shower bench  OT Goal Progression  Progress towards OT goals Progressing toward goals  Acute Rehab OT Goals  Patient Stated Goal to go home  OT Goal Formulation With patient  Time For Goal Achievement 08/29/16  Potential to Achieve Goals Good  ADL Goals  Pt Will Perform Grooming with set-up;with supervision;sitting  Pt Will Perform Upper Body Bathing with supervision;sitting  Pt Will Perform Lower Body Bathing with min assist;sit to/from stand  Pt Will Perform Upper Body Dressing with supervision;sitting  Pt Will Perform Lower Body Dressing with min assist;sit to/from stand  Pt Will Transfer to Toilet with min assist;ambulating;bedside commode  Pt Will Perform Toileting - Clothing Manipulation and hygiene with min assist;sit to/from stand  OT Time Calculation  OT Start Time (ACUTE ONLY) 1415  OT Stop Time (ACUTE ONLY) 1445  OT Time Calculation (min) 30 min  OT General Charges  $OT Visit 1 Procedure  [facility] Deshondra Worst, OT/L  - note transcribed over phone for Martie RoundJulie Mayberry, OTR/L [phone] 08/20/2016

## 2016-08-21 NOTE — Clinical Social Work Note (Signed)
Pt is ready for discharge to [facility] under a LOG approved by Medical Director while Medicare is addressed. Pt is aware and agreeable to discharge plan. Pt will notify his family of transfer. Facility is ready to admit pt as they have received discharge information. RN will call report. [facility] will provide transportation. CSW is signing off as no further needs identified.   Dede QuerySarah Kindsey Eblin, MSW, LCSW  Clinical Social Worker  [phone]

## 2016-08-23 ENCOUNTER — Telehealth: Payer: Self-pay | Admitting: Internal Medicine

## 2016-08-23 NOTE — Telephone Encounter (Signed)
[facility] TELEPHONE ENCOUNTER NOTE  Patient: Johnny [name] ZOX:[id number]   PCP: Jov[facility]ans [facility] DOB: Jan 01, 1956     DOS: 08/23/2016    Subjective: Received an email from the office, regarding a call from the pharmacy as agents insurance is not covering prevpac.  Assessment and plan: Called patient's pharmacy at provided [phone]. Prescribed separate prescriptions for all the 3 medications and the combination. Protonix 40 mg twice a day before meals 60 tablets 0 refill. Amoxicillin 1 g twice a day 28 tablets 0 refill. Clarithromycin 500 mg twice a day 28 tablets 0 refill.  Author: Lynden OxfordPranav Patel, MD [facility] Pager: [phone] 08/23/2016 12:58 PM   If 7PM-7AM, please contact night-coverage at [email or web address], password [email or web address]

## 2016-09-27 ENCOUNTER — Ambulatory Visit: Payer: Medicare Other | Admitting: Physician Assistant

## 2016-09-27 ENCOUNTER — Ambulatory Visit: Payer: Medicare Other | Admitting: Internal Medicine

## 2016-09-27 ENCOUNTER — Other Ambulatory Visit: Payer: Self-pay

## 2017-01-31 IMAGING — CR DG CHEST 1V PORT
1 series · 1 of 1 positions shown · non-contrast
Comparison: 08/10/2016.

CLINICAL DATA: Intubation.

EXAM:
PORTABLE CHEST 1 VIEW

[AP]
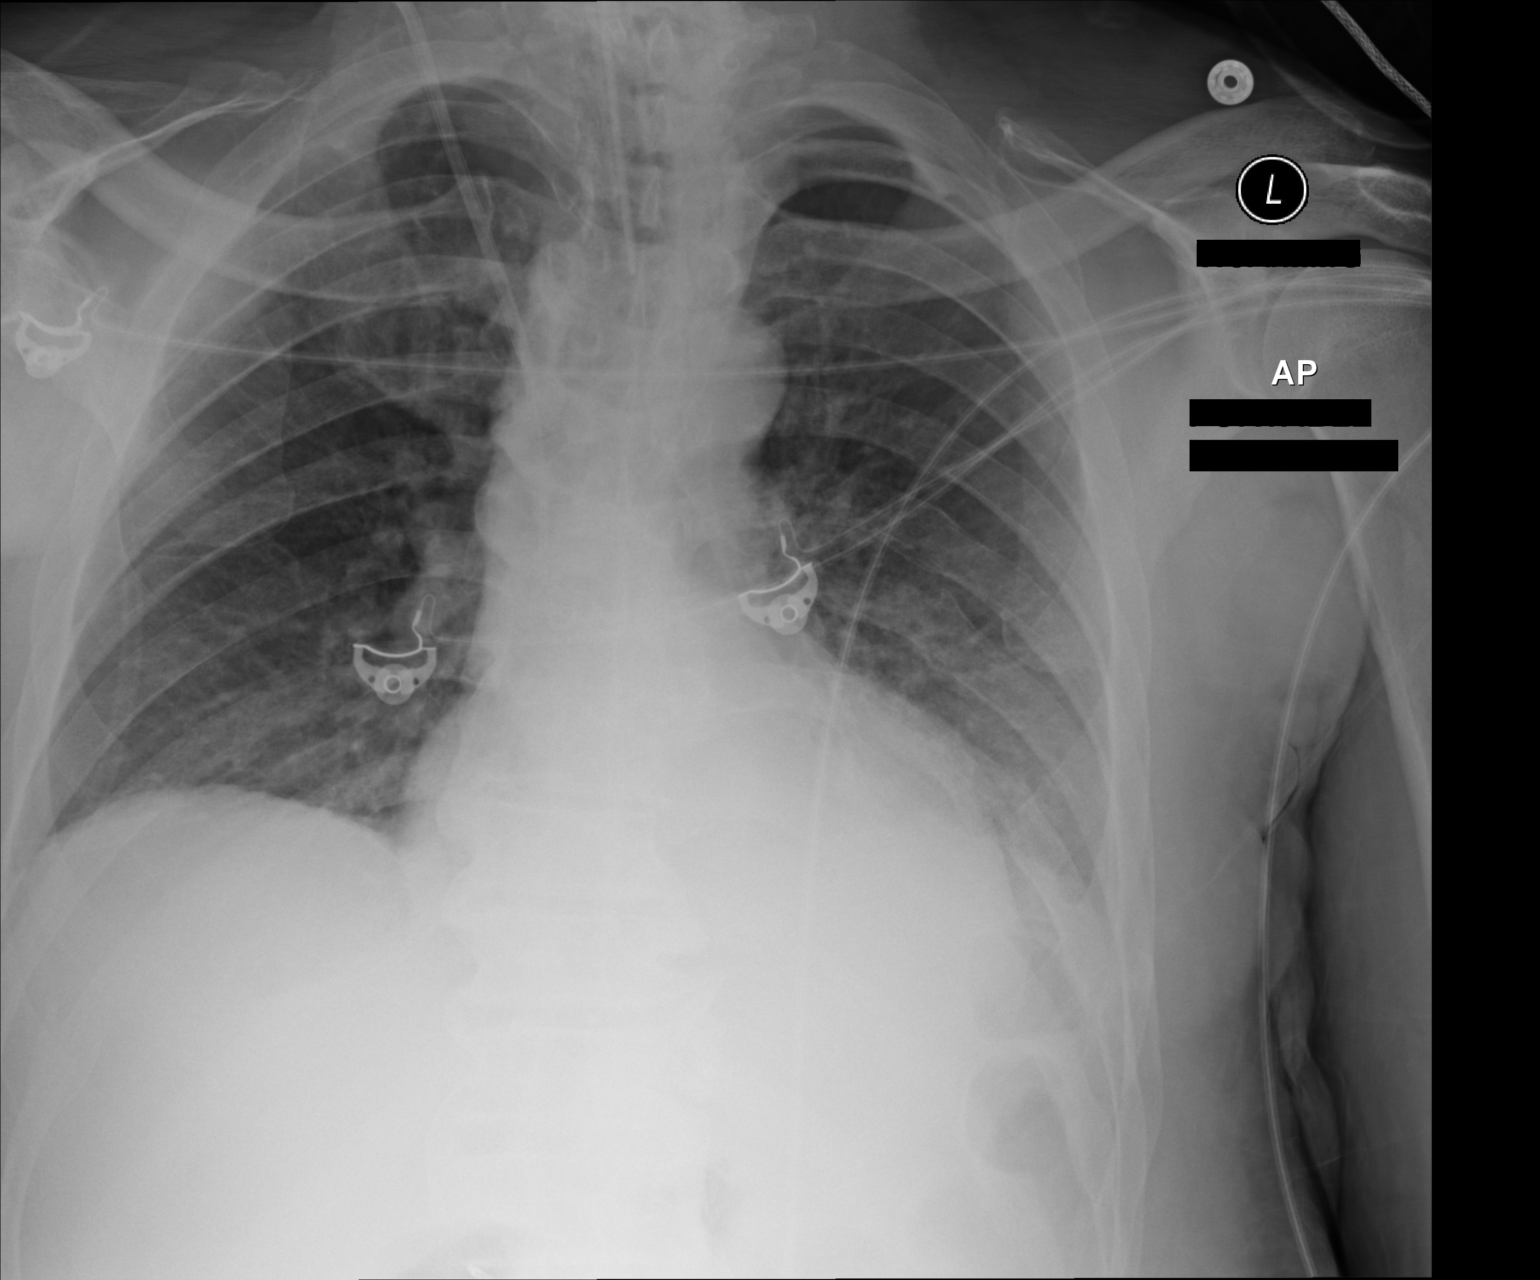

[1 of 1 positions shown; findings below may reference images not displayed]

FINDINGS: Interim removal of NG tube. Endotracheal tube and right IJ line
stable position. Heart size stable. Low lung volumes with mild
basilar atelectasis. Persistent left base infiltrate. Tiny left
pleural effusion. No pneumothorax.
IMPRESSION: 1. An removal of NG tube. Endotracheal tube and right IJ line stable
position.

2. Low lung volumes. Persistent left base infiltrate suggesting
pneumonia. Small left pleural effusion.

## 2017-02-02 IMAGING — XA IR EMBO ART  VEN HEMORR LYMPH EXTRAV  INC GUIDE ROADMAPPING
2 series · 13 of 24 positions shown · non-contrast
Comparison: none

INDICATION: Known active duodenal ulcer, status post endoscopic epi injection,
cautery and endoscopic clipping. Recurrent GI bleeding with acute
bright red blood per rectum.

[Series 100: dsa body · 1 of 2 slices shown (1 of 2)]
[im 1/2]
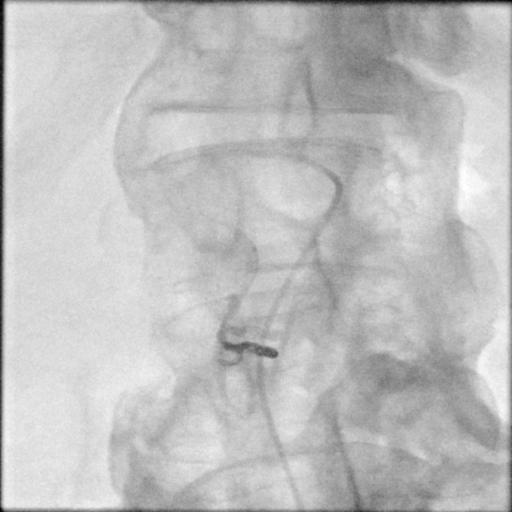

[Series 300: dsa body · 12 of 73 slices shown (2 of 2)]
[im 4/73]
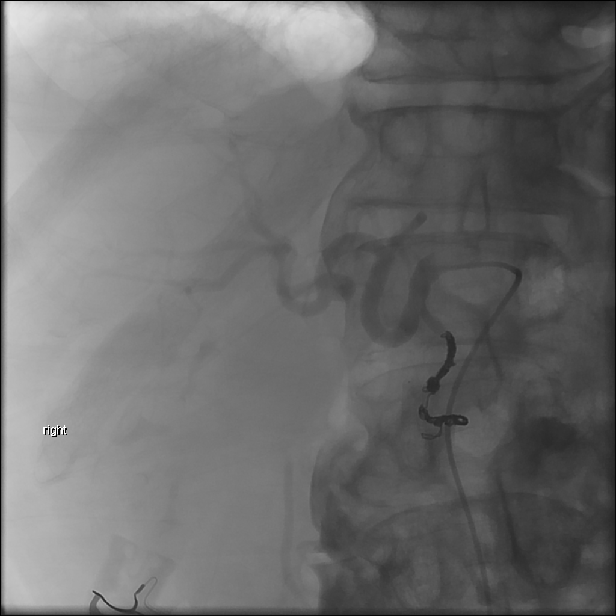
[im 10/73]
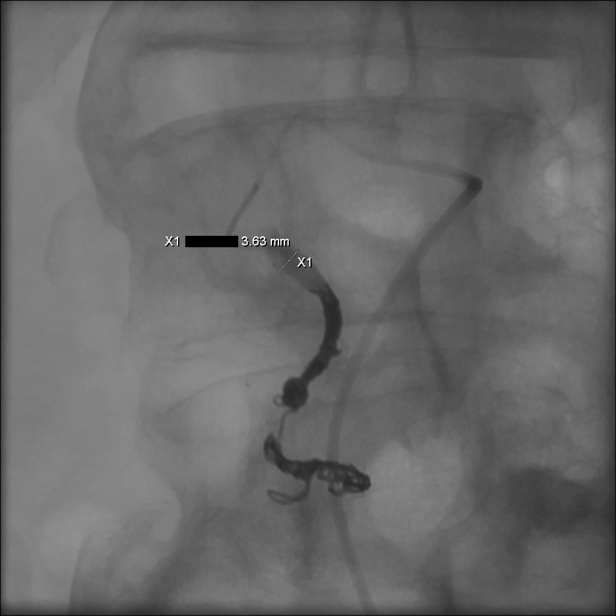
[im 17/73]
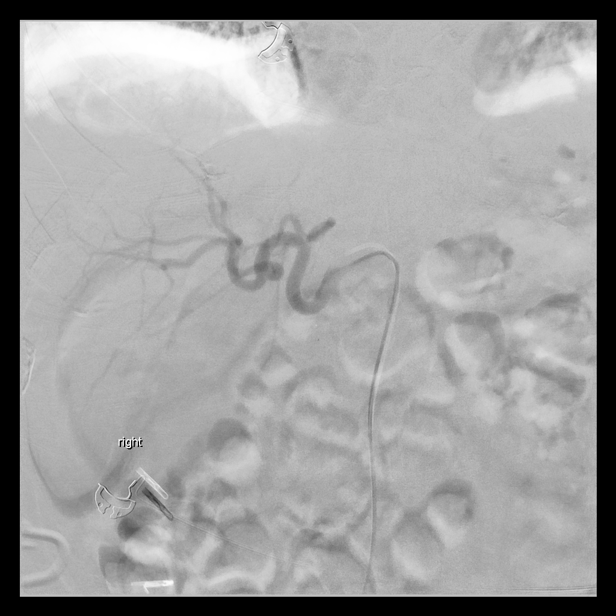
[im 23/73]
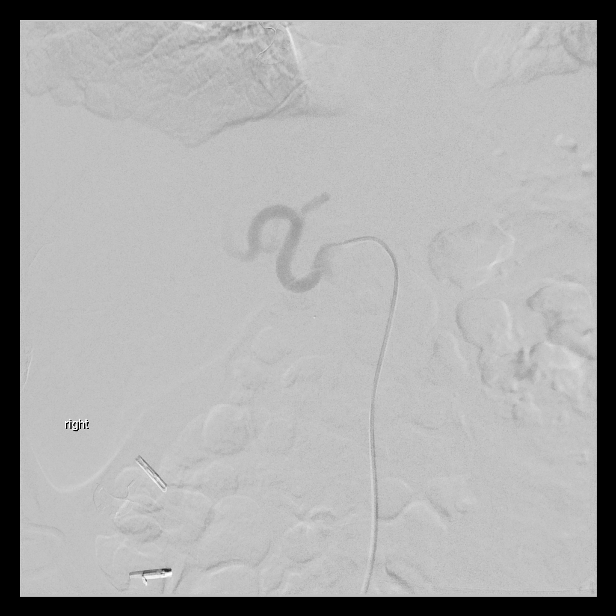
[im 30/73]
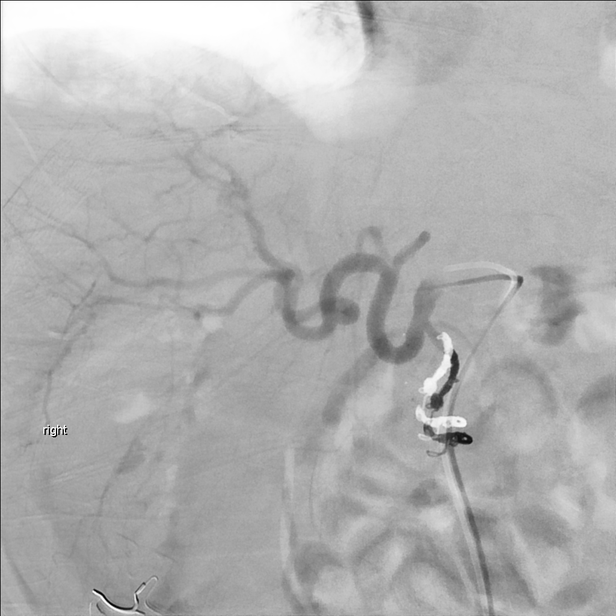
[im 37/73]
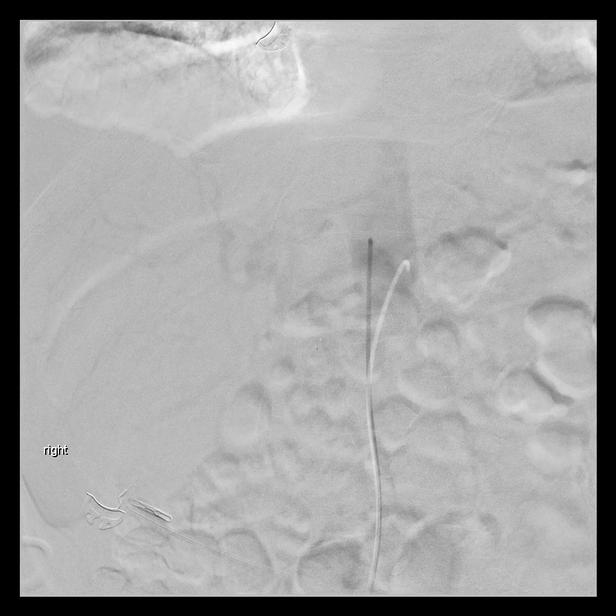
[im 40/73]
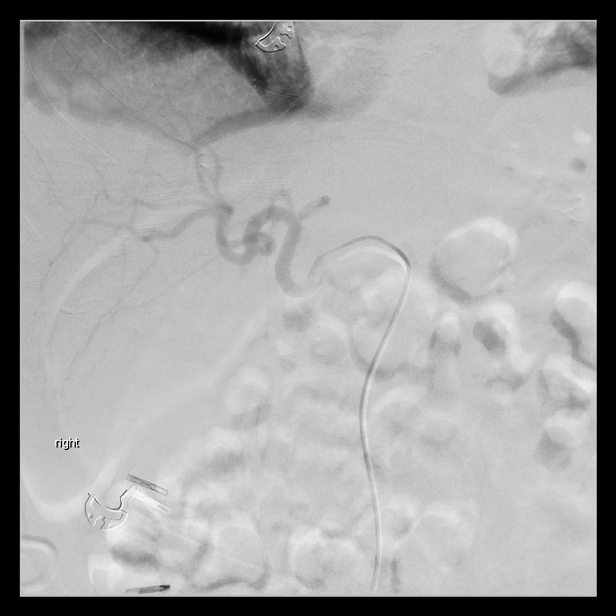
[im 46/73]
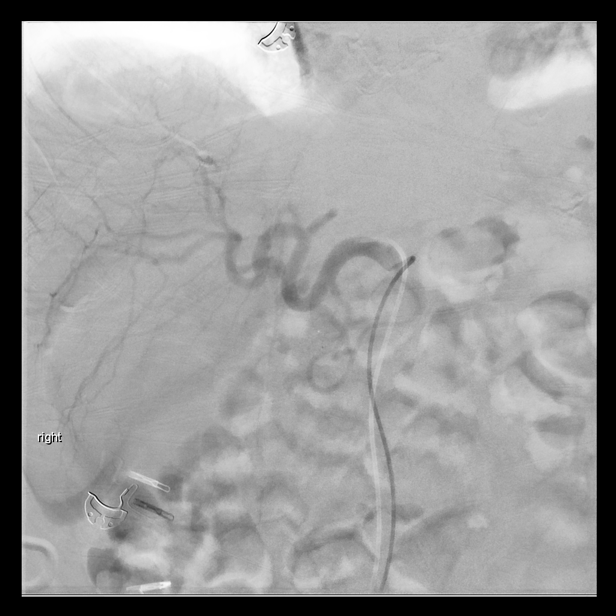
[im 53/73]
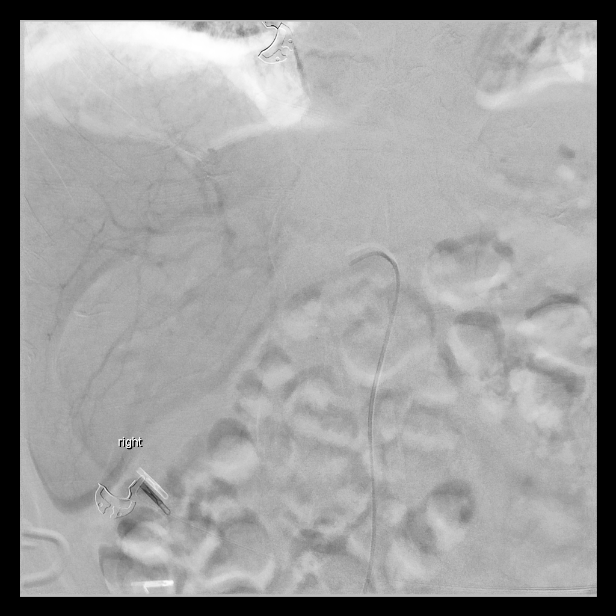
[im 59/73]
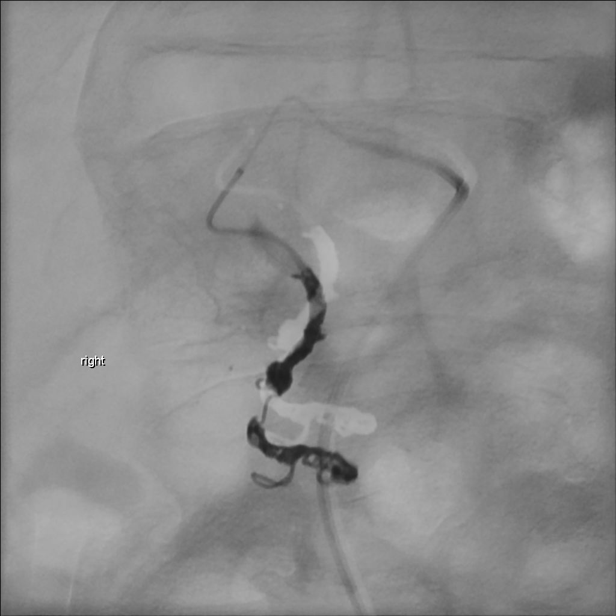
[im 66/73]
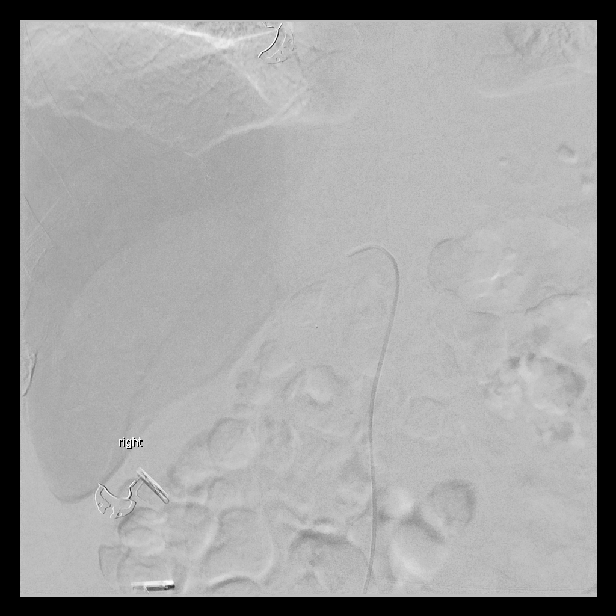
[im 73/73]
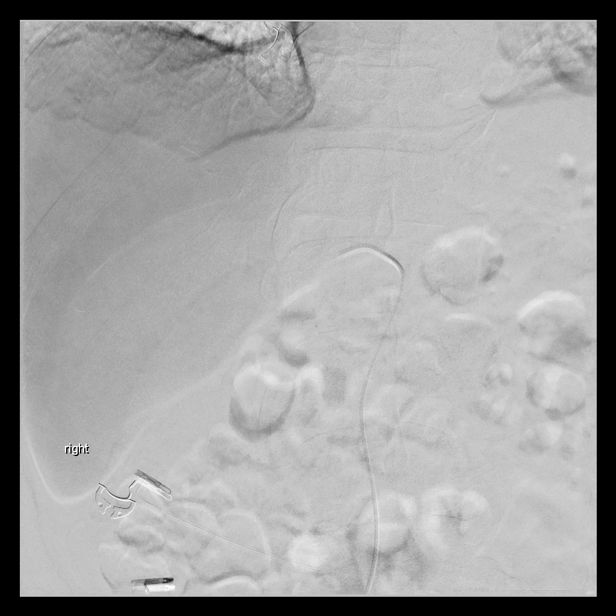

[13 of 24 positions shown; findings below may reference images not displayed]

EXAM:
SELECTIVE VISCERAL ARTERIOGRAPHY; ADDITIONAL ARTERIOGRAPHY; IR
ULTRASOUND GUIDANCE VASC ACCESS RIGHT; IR DORA ALBA IBANEZ HEMORR LYMPH
EXTRAV INC GUIDE ROADMAPPING; ARTERIOGRAPHY

MEDICATIONS:
None.

ANESTHESIA/SEDATION:
Moderate (conscious) sedation was employed during this procedure. A
total of Versed 6.0 mg and Fentanyl 250 mcg was administered
intravenously.

Moderate Sedation Time: 70 Minutes. The patient's level of
consciousness and vital signs were monitored continuously by
radiology nursing throughout the procedure under my direct
supervision.

CONTRAST:  50mL 2Y6W2L-U44 IOPAMIDOL (2Y6W2L-U44) INJECTION 61%,
50mL 2Y6W2L-U44 IOPAMIDOL (2Y6W2L-U44) INJECTION 61%, 10mL
2Y6W2L-U44 IOPAMIDOL (2Y6W2L-U44) INJECTION 61%

FLUOROSCOPY TIME:  Fluoroscopy Time: 28 minutes 48 seconds (1,118
mGy).

COMPLICATIONS:
None immediate.



Previous imaging and endoscopic pictures reviewed.

Under sterile conditions and local anesthesia, ultrasound
micropuncture access performed of the right common femoral artery.
Five French sheath inserted. C2 catheter utilized to select the
celiac origin. Selective celiac angiogram performed.

Celiac: Celiac origin is widely patent. Splenic and hepatic
vasculature are patent.

Over a Glidewire, the C2 catheter was advanced into the common
hepatic artery. Common hepatic angiogram performed.

Common hepatic: Common hepatic, GDA, proper hepatic, right hepatic
and left hepatic arteries are all patent.

Lantern microcatheter utilized over a fathom guidewire to eventually
selected the gastroduodenal artery.

Selected GDA angiogram performed.

GDA: Gastroduodenal artery is patent as well as the gastroepiploic
artery. Mid GDA focal bulging noted compatible with a GDA
pseudoaneurysm.

GDA embolization: Through the microcatheter, 3 mm x 30 mm (2) and 3
mm right 20 mm(1) Ruby micro coils were deployed within the GDA
occluding the mid GDA pseudoaneurysm.

Following embolization, final angiogram confirms occlusion of the
GDA with no longer visualization of the pseudoaneurysm. Repeat
common hepatic angiogram confirms patency of the hepatic
vasculature.

Access removed.

Hemostasis obtained with a Exoseal device. No immediate
complication. Patient tolerated the procedure well.
IMPRESSION: Successful celiac, common hepatic, gastroduodenal angiograms.

Mid GDA small pseudoaneurysm demonstrated with successful GDA micro
coil embolization as detailed above.

## 2018-09-29 ENCOUNTER — Other Ambulatory Visit: Payer: Self-pay

## 2018-09-29 ENCOUNTER — Encounter (HOSPITAL_COMMUNITY): Payer: Self-pay | Admitting: Emergency Medicine

## 2018-09-29 ENCOUNTER — Emergency Department (HOSPITAL_COMMUNITY): Payer: Medicare HMO

## 2018-09-29 ENCOUNTER — Emergency Department (HOSPITAL_COMMUNITY)
Admission: EM | Admit: 2018-09-29 | Discharge: 2018-09-30 | Disposition: A | Discharge status: Home / Self Care | Payer: Medicare HMO | Attending: Emergency Medicine | Admitting: Emergency Medicine

## 2018-09-29 DIAGNOSIS — M4815 Ankylosing hyperostosis [Forestier], thoracolumbar region: Secondary | ICD-10-CM | POA: Diagnosis not present

## 2018-09-29 DIAGNOSIS — M48061 Spinal stenosis, lumbar region without neurogenic claudication: Secondary | ICD-10-CM

## 2018-09-29 DIAGNOSIS — M4810 Ankylosing hyperostosis [Forestier], site unspecified: Secondary | ICD-10-CM

## 2018-09-29 DIAGNOSIS — I10 Essential (primary) hypertension: Secondary | ICD-10-CM | POA: Diagnosis not present

## 2018-09-29 DIAGNOSIS — F17210 Nicotine dependence, cigarettes, uncomplicated: Secondary | ICD-10-CM | POA: Diagnosis not present

## 2018-09-29 DIAGNOSIS — M25551 Pain in right hip: Secondary | ICD-10-CM | POA: Diagnosis present

## 2018-09-29 MED ORDER — KETOROLAC TROMETHAMINE 60 MG/2ML IM SOLN: 60.0000 mg | Freq: Once | INTRAMUSCULAR | Status: DC

## 2018-09-29 MED ORDER — OXYCODONE-ACETAMINOPHEN 5-325 MG PO TABS
2.0000 | ORAL_TABLET | Freq: Once | ORAL | Status: AC
  Administered 2018-09-29: 2 via ORAL
  Filled 2018-09-29: qty 2

## 2018-09-29 MED ORDER — CLONIDINE HCL 0.1 MG PO TABS
0.1000 mg | ORAL_TABLET | Freq: Once | ORAL | Status: AC
  Administered 2018-09-29: 0.1 mg via ORAL
  Filled 2018-09-29: qty 1

## 2018-09-29 MED ORDER — DEXAMETHASONE SODIUM PHOSPHATE 10 MG/ML IJ SOLN
10.0000 mg | Freq: Once | INTRAMUSCULAR | Status: AC
  Administered 2018-09-30: 10 mg via INTRAMUSCULAR
  Filled 2018-09-29: qty 1

## 2018-09-29 MED ORDER — CLONIDINE HCL 0.1 MG PO TABS
0.1000 mg | ORAL_TABLET | Freq: Once | ORAL | Status: AC
  Administered 2018-09-30: 0.1 mg via ORAL
  Filled 2018-09-29: qty 1

## 2018-09-29 MED ORDER — AMLODIPINE BESYLATE 5 MG PO TABS
5.0000 mg | ORAL_TABLET | Freq: Once | ORAL | Status: AC
  Administered 2018-09-30: 5 mg via ORAL
  Filled 2018-09-29: qty 1

## 2018-09-29 MED ORDER — PANTOPRAZOLE SODIUM 40 MG PO TBEC
40.0000 mg | DELAYED_RELEASE_TABLET | Freq: Once | ORAL | Status: AC
  Administered 2018-09-30: 40 mg via ORAL
  Filled 2018-09-29: qty 1

## 2018-09-29 MED ORDER — OXYCODONE HCL 5 MG PO TABS
5.0000 mg | ORAL_TABLET | Freq: Once | ORAL | Status: AC
  Administered 2018-09-30: 5 mg via ORAL
  Filled 2018-09-29: qty 1

## 2018-09-29 NOTE — ED Provider Notes (Signed)
[facility]-EMERGENCY DEPT Provider Note   CSN: [id number] Arrival date & time: 09/29/18  1351    History   Chief Complaint Chief Complaint  Patient presents with  . Hip Pain  . Groin Pain    HPI Johnny [name] is a 63 y.o. male.     HPI 63 year old male with past medical history as below including history of crack cocaine abuse, alcohol abuse, here with back pain.  The patient states that over the last several weeks, has had progressively worsening aching, throbbing, severe, right hip and lower back pain.  It radiates down towards his right lower quadrant.  The pain is worse with any kind of movement and palpation.  He was drinking and fell 2 weeks ago, which made all of his symptoms worse.  He is now having trouble walking due to the pain.  Pain is worse with any kind of movement or weightbearing.  No alleviating factors.  No fevers or chills.  No recent night sweats or weight loss.  No other complaints.  Past Medical History:  Diagnosis Date  . Alcoholism (HCC)   . Arthritis   . Broken neck (HCC)   . Drug abuse (HCC)    crack  . GERD (gastroesophageal reflux disease)   . H/O tinnitus   . Hydrocele   . Hypertension    no meds  . Loose, teeth     Patient Active Problem List   Diagnosis Date Noted  . Acute respiratory failure with hypoxia (HCC)   . Essential hypertension   . ARF (acute renal failure) (HCC)   . Encephalopathy   . Acute duodenal ulcer with hemorrhage   . Ulcer of esophagus without bleeding   . Acute upper GI bleeding   . Gastrointestinal hemorrhage with hematemesis 08/10/2016  . Acute blood loss anemia     Past Surgical History:  Procedure Laterality Date  . ESOPHAGOGASTRODUODENOSCOPY N/A 08/11/2016   Procedure: ESOPHAGOGASTRODUODENOSCOPY (EGD);  Surgeon: Iva Boop, MD;  Location: [facility] ENDOSCOPY;  Service: Endoscopy;  Laterality: N/A;  In ICU  . HEMORRHOID SURGERY     as a teenager  . HYDROCELE EXCISION Right 04/15/2014   Procedure: RIGHT HYDROCELECTOMY ADULT;  Surgeon: Crist Fat, MD;  Location: [facility];  Service: Urology;  Laterality: Right;  . IR GENERIC HISTORICAL  08/14/2016   IR ANGIOGRAM SELECTIVE EACH ADDITIONAL VESSEL 08/14/2016 Berdine Dance, MD [facility]-INTERV RAD  . IR GENERIC HISTORICAL  08/14/2016   IR US GUIDE VASC ACCESS RIGHT 08/14/2016 Berdine Dance, MD [facility]  . IR GENERIC HISTORICAL  08/14/2016   IR ANGIOGRAM SELECTIVE EACH ADDITIONAL VESSEL 08/14/2016 Berdine Dance, MD [facility]  . IR GENERIC HISTORICAL  08/14/2016   IR ANGIOGRAM FOLLOW UP STUDY 08/14/2016 Berdine Dance, MD [facility]  . IR GENERIC HISTORICAL  08/14/2016   IR ANGIOGRAM VISCERAL SELECTIVE 08/14/2016 Berdine Dance, MD [facility]  . IR GENERIC HISTORICAL  08/14/2016   IR EMBO ART  VEN HEMORR LYMPH EXTRAV  INC GUIDE ROADMAPPING 08/14/2016 Berdine Dance, MD [facility]  . NECK SURGERY  1996  . TESTICLE SURGERY          Home Medications    Prior to Admission medications   Medication Sig Start Date End Date Taking? Authorizing Provider  amLODipine (NORVASC) 5 MG tablet Take 1 tablet (5 mg total) by mouth daily for 30 days. 09/30/18 10/30/18  Shaune Pollack, MD  HYDROcodone-acetaminophen (NORCO/VICODIN) 5-325 MG tablet Take 1-2 tablets by mouth every 6 (  six) hours as needed for moderate pain or severe pain. 09/30/18   Shaune Pollack, MD  methocarbamol (ROBAXIN) 500 MG tablet Take 1 tablet (500 mg total) by mouth every 8 (eight) hours as needed for muscle spasms. 09/30/18   Shaune Pollack, MD  pantoprazole (PROTONIX) 20 MG tablet Take 1 tablet (20 mg total) by mouth daily for 30 days. 09/30/18 10/30/18  Shaune Pollack, MD    Family History Family History  Problem Relation Age of Onset  . Colon cancer Paternal Uncle   . Heart disease Mother        father    Social History Social History   Tobacco Use  . Smoking status: Current Every Day Smoker    Packs/day: 0.25    Types: Cigarettes  .  Smokeless tobacco: Never Used  Substance Use Topics  . Alcohol use: Yes    Alcohol/week: 84.0 standard drinks    Types: 84 Cans of beer per week  . Drug use: Yes    Comment: "crack"     Allergies   Ketamine   Review of Systems Review of Systems  Constitutional: Negative for chills and fever.  HENT: Negative for ear pain and sore throat.   Eyes: Negative for pain and visual disturbance.  Respiratory: Negative for cough and shortness of breath.   Cardiovascular: Negative for chest pain and palpitations.  Gastrointestinal: Negative for abdominal pain and vomiting.  Genitourinary: Negative for dysuria and hematuria.  Musculoskeletal: Positive for arthralgias, back pain and gait problem.  Skin: Negative for color change and rash.  Neurological: Negative for seizures and syncope.  All other systems reviewed and are negative.    Physical Exam Updated Vital Signs BP (!) 180/96 Comment: Dr. Erma Heritage aware.  Pulse 82   Temp 98.1 F (36.7 C) (Oral)   Resp 16   SpO2 100%   Physical Exam Vitals signs and nursing note reviewed.  Constitutional:      General: He is not in acute distress.    Appearance: He is well-developed.  HENT:     Head: Normocephalic and atraumatic.  Eyes:     Conjunctiva/sclera: Conjunctivae normal.  Neck:     Musculoskeletal: Neck supple.  Cardiovascular:     Rate and Rhythm: Normal rate and regular rhythm.     Heart sounds: Normal heart sounds. No murmur. No friction rub.  Pulmonary:     Effort: Pulmonary effort is normal. No respiratory distress.     Breath sounds: Normal breath sounds. No wheezing or rales.  Abdominal:     General: There is no distension.     Palpations: Abdomen is soft.     Tenderness: There is no abdominal tenderness.  Musculoskeletal:     Comments: Mild TTP R hip, no bruising or deformity.  Skin:    General: Skin is warm.     Capillary Refill: Capillary refill takes less than 2 seconds.  Neurological:     Mental Status: He  is alert and oriented to person, place, and time.     Motor: No abnormal muscle tone.     Spine Exam: Inspection/Palpation: Significant diffuse lower lumbar TTP, no bruising or deformity. TTP over R paraspinal musculature causing reproduction of his hip pain. Strength: 5/5 throughout LE bilaterally (hip flexion/extension, adduction/abduction; knee flexion/extension; foot dorsiflexion/plantarflexion, inversion/eversion; great toe inversion) Sensation: Intact to light touch in proximal and distal LE bilaterally Reflexes: 2+ quadriceps and achilles reflexes  ED Treatments / Results  Labs (all labs ordered are listed, but only abnormal results are  displayed) Labs Reviewed - No data to display  EKG None  Radiology Ct Lumbar Spine Wo Contrast  Result Date: 09/29/2018 CLINICAL DATA:  Back pain radiating to the right hip and groin. EXAM: CT LUMBAR SPINE WITHOUT CONTRAST TECHNIQUE: Multidetector CT imaging of the lumbar spine was performed without intravenous contrast administration. Multiplanar CT image reconstructions were also generated. COMPARISON:  Lumbar spine CT 07/03/2016 FINDINGS: Segmentation: 5 lumbar type vertebrae. Alignment: Normal. Vertebrae: There is no acute fracture or evidence of discitis-osteomyelitis. There are bulky anterior and bilateral osteophytes at all levels. There is anterior ankylosis of the T10-L2 levels. Paraspinal and other soft tissues: Negative. Disc levels: T12-L1: Moderate facet hypertrophy. No spinal canal stenosis. Mild-to-moderate neural foraminal stenosis on the right. L1-2: Large left-sided osteophyte. No spinal canal or neural foraminal stenosis. L2-3: Ligamentum flavum redundancy and moderate facet hypertrophy. Mild disc bulge. Mild spinal canal stenosis. Moderate right and mild left neural foraminal stenosis. L3-4: Large disc osteophyte complex with mild spinal canal narrowing. Moderate bilateral neural foraminal stenosis. L4-5: Large disc osteophyte complex  and mild facet hypertrophy. Moderate bilateral neural foraminal stenosis. L5-S1: Intermediate disc osteophyte complex with severe bilateral neural foraminal stenosis. No spinal canal stenosis. IMPRESSION: 1. No acute abnormality of the lumbar spine. 2. Large multilevel osteophytes compatible with diffuse idiopathic skeletal hyperostosis. 3. Unchanged multilevel mild-to-moderate spinal canal stenosis and moderate-to-severe neural foraminal stenosis. Electronically Signed   By: Deatra Robinson M.D.   On: 09/29/2018 22:57   Dg Hip Unilat  With Pelvis 2-3 Views Right  Result Date: 09/29/2018 CLINICAL DATA:  Initial evaluation for acute right hip pain, recent fall 2 weeks ago. EXAM: DG HIP (WITH OR WITHOUT PELVIS) 2-3V RIGHT COMPARISON:  Prior radiograph from 06/23/2016. FINDINGS: No acute fracture or dislocation. Femoral heads in normal alignment within the acetabula. Femoral head heights maintained. Bony pelvis intact. SI joints approximated symmetric. Moderate osteoarthritic changes about the hips bilaterally, stable. Prominent degenerative changes noted within lower lumbar spine. No soft tissue abnormality. IMPRESSION: 1. No acute osseous abnormality about the right hip. 2. Moderate osteoarthritic changes about the hips bilaterally. Electronically Signed   By: Rise Mu M.D.   On: 09/29/2018 15:22    Procedures Procedures (including critical care time)  Medications Ordered in ED Medications  oxyCODONE-acetaminophen (PERCOCET/ROXICET) 5-325 MG per tablet 2 tablet (2 tablets Oral Given 09/29/18 2049)  cloNIDine (CATAPRES) tablet 0.1 mg (0.1 mg Oral Given 09/29/18 2049)  dexamethasone (DECADRON) injection 10 mg (10 mg Intramuscular Given 09/30/18 0006)  oxyCODONE (Oxy IR/ROXICODONE) immediate release tablet 5 mg (5 mg Oral Given 09/30/18 0003)  pantoprazole (PROTONIX) EC tablet 40 mg (40 mg Oral Given 09/30/18 0003)  amLODipine (NORVASC) tablet 5 mg (5 mg Oral Given 09/30/18 0003)  cloNIDine  (CATAPRES) tablet 0.1 mg (0.1 mg Oral Given 09/30/18 0003)     Initial Impression / Assessment and Plan / ED Course  I have reviewed the triage vital signs and the nursing notes.  Pertinent labs & imaging results that were available during my care of the patient were reviewed by me and considered in my medical decision making (see chart for details).        63 year old male here with lower back and right hip pain after fall 2 weeks ago.  He has a history of chronic back pain.  No lower extremity weakness, numbness, or symptoms to suggest cauda equina.  Plain films of the hip obtained in triage are negative.  I suspect his pain is more so secondary to a lumbar  radiculopathy given reproduction upon palpation and movement of the spine.  Will check a CT given his known degenerative disease here.  No signs of acute cord compression.  CT scan shows no acute fx but is c/w severe skeletal hyperostosis w/ neural foraminal stenosis. This is a chronic but worsening issue. Pt has no LE weakness, numbness or signs of cauda equina.  Pt feels better with pain meds. BP improving. Will d/c with analgesics, close outpt follow-up. Continues to have no LE weakness, numbness, or signs of cord compression. Decadron given and will start on PPI as he has a h/o ulcers, advised him NOT to take any NSAIDs or ASA at home.  Regarding his BP - no HA. No abd pain, no pulsatile masses, no signs of dissection and pain is completely reproducible on exam, doubt dissection. He states his BP at [facility] and random checks is "always" >200/100 and I suspect this is chronic. It does seem to be improving w/ analgesia, antiHTn in ED. Will start on norvasc given his h/o cocaine abuse, advise him to f/u with PCP this week.  Final Clinical Impressions(s) / ED Diagnoses   Final diagnoses:  Degenerative lumbar spinal stenosis  Diffuse idiopathic skeletal hyperostosis    ED Discharge Orders         Ordered    amLODipine (NORVASC) 5 MG  tablet  Daily     09/30/18 0027    HYDROcodone-acetaminophen (NORCO/VICODIN) 5-325 MG tablet  Every 6 hours PRN     09/30/18 0027    pantoprazole (PROTONIX) 20 MG tablet  Daily     09/30/18 0027    methocarbamol (ROBAXIN) 500 MG tablet  Every 8 hours PRN     09/30/18 Purvis Kilts, MD 09/30/18 [phone]

## 2018-09-29 NOTE — ED Notes (Signed)
RN made aware of pts elevated BP 

## 2018-09-29 NOTE — ED Triage Notes (Addendum)
Per EMS, states right hip and groin pain for 4 months, worse over last 2 weeks-recent fall 2 weeks ago exacerbated chronic pain-noncompliant with BP meds-hypertensive on scene-200/120-states he has been self medicating with ETOH and tylenol-has'nt been drinking today

## 2018-09-30 MED ORDER — HYDROCODONE-ACETAMINOPHEN 5-325 MG PO TABS: 1.0000 | ORAL_TABLET | Freq: Four times a day (QID) | ORAL | 0 refills | Status: AC | PRN

## 2018-09-30 MED ORDER — PANTOPRAZOLE SODIUM 20 MG PO TBEC: 20.0000 mg | DELAYED_RELEASE_TABLET | Freq: Every day | ORAL | 0 refills | Status: DC

## 2018-09-30 MED ORDER — AMLODIPINE BESYLATE 5 MG PO TABS: 5.0000 mg | ORAL_TABLET | Freq: Every day | ORAL | 0 refills | Status: DC

## 2018-09-30 MED ORDER — METHOCARBAMOL 500 MG PO TABS: 500.0000 mg | ORAL_TABLET | Freq: Three times a day (TID) | ORAL | 0 refills | Status: DC | PRN

## 2018-09-30 NOTE — Discharge Instructions (Signed)
It is VERY important to take your blood pressure medicine  Take the pain medication as prescribed  Call A PRIMARY doctor to be seen in the next 1 week

## 2018-09-30 NOTE — ED Notes (Signed)
[facility] called for transport.  

## 2019-04-16 ENCOUNTER — Other Ambulatory Visit: Payer: Self-pay | Admitting: Internal Medicine

## 2019-04-16 DIAGNOSIS — M545 Low back pain, unspecified: Secondary | ICD-10-CM

## 2019-05-07 ENCOUNTER — Other Ambulatory Visit: Payer: Self-pay

## 2019-05-07 ENCOUNTER — Ambulatory Visit
Admission: RE | Admit: 2019-05-07 | Discharge: 2019-05-07 | Disposition: A | Discharge status: Home / Self Care | Payer: Medicare HMO | Source: Ambulatory Visit | Attending: Internal Medicine | Admitting: Internal Medicine

## 2019-05-07 DIAGNOSIS — M545 Low back pain, unspecified: Secondary | ICD-10-CM

## 2019-05-08 MED ORDER — GADOBENATE DIMEGLUMINE 529 MG/ML IV SOLN
20.0000 mL | Freq: Once | INTRAVENOUS | Status: AC | PRN
  Administered 2019-05-08: 20 mL via INTRAVENOUS

## 2019-07-21 ENCOUNTER — Ambulatory Visit: Payer: Medicare HMO | Admitting: Podiatry

## 2019-09-08 DIAGNOSIS — M6281 Muscle weakness (generalized): Secondary | ICD-10-CM | POA: Insufficient documentation

## 2019-09-08 DIAGNOSIS — Z720 Tobacco use: Secondary | ICD-10-CM | POA: Insufficient documentation

## 2019-09-08 DIAGNOSIS — K219 Gastro-esophageal reflux disease without esophagitis: Secondary | ICD-10-CM | POA: Insufficient documentation

## 2019-09-08 DIAGNOSIS — L97409 Non-pressure chronic ulcer of unspecified heel and midfoot with unspecified severity: Secondary | ICD-10-CM | POA: Insufficient documentation

## 2019-09-08 DIAGNOSIS — M1990 Unspecified osteoarthritis, unspecified site: Secondary | ICD-10-CM | POA: Insufficient documentation

## 2019-09-08 DIAGNOSIS — M4800 Spinal stenosis, site unspecified: Secondary | ICD-10-CM | POA: Insufficient documentation

## 2019-09-08 DIAGNOSIS — F1910 Other psychoactive substance abuse, uncomplicated: Secondary | ICD-10-CM | POA: Insufficient documentation

## 2019-09-08 DIAGNOSIS — G894 Chronic pain syndrome: Secondary | ICD-10-CM | POA: Insufficient documentation

## 2019-09-13 ENCOUNTER — Other Ambulatory Visit: Payer: Self-pay

## 2019-09-13 ENCOUNTER — Other Ambulatory Visit (INDEPENDENT_AMBULATORY_CARE_PROVIDER_SITE_OTHER): Payer: Medicare HMO

## 2019-09-13 ENCOUNTER — Encounter: Payer: Self-pay | Admitting: Physician Assistant

## 2019-09-13 ENCOUNTER — Ambulatory Visit (INDEPENDENT_AMBULATORY_CARE_PROVIDER_SITE_OTHER): Payer: Medicare HMO | Admitting: Physician Assistant

## 2019-09-13 VITALS — BP 124/92 | HR 92 | Temp 97.8°F | Ht 73.0 in

## 2019-09-13 DIAGNOSIS — R195 Other fecal abnormalities: Secondary | ICD-10-CM

## 2019-09-13 DIAGNOSIS — K5909 Other constipation: Secondary | ICD-10-CM | POA: Diagnosis not present

## 2019-09-13 LAB — CBC WITH DIFFERENTIAL/PLATELET
Basophils Relative: 0 % (ref 0.0–3.0)
Eosinophils Relative: 4 % (ref 0.0–5.0)
HCT: 32 % — ABNORMAL LOW (ref 39.0–52.0)
Hemoglobin: 9.7 g/dL — ABNORMAL LOW (ref 13.0–17.0)
Lymphocytes Relative: 32 % (ref 12.0–46.0)
MCHC: 30.3 g/dL (ref 30.0–36.0)
MCV: 73.9 fl — ABNORMAL LOW (ref 78.0–100.0)
Monocytes Relative: 10 % (ref 3.0–12.0)
Neutrophils Relative %: 54 % (ref 43.0–77.0)
Platelets: 270 10*3/uL (ref 150.0–400.0)
RBC: 4.33 Mil/uL (ref 4.22–5.81)
RDW: 20.8 % — ABNORMAL HIGH (ref 11.5–15.5)
WBC: 7.2 10*3/uL (ref 4.0–10.5)

## 2019-09-13 LAB — IBC + FERRITIN
Ferritin: 7.6 ng/mL — ABNORMAL LOW (ref 22.0–322.0)
Iron: 49 ug/dL (ref 42–165)
Saturation Ratios: 8.8 % — ABNORMAL LOW (ref 20.0–50.0)
Transferrin: 398 mg/dL — ABNORMAL HIGH (ref 212.0–360.0)

## 2019-09-13 NOTE — Patient Instructions (Addendum)
If you are age 66 or older, your body mass index should be between 23-30. Your Body mass index is 11.74 kg/m. If this is out of the aforementioned range listed, please consider follow up with your Primary Care Provider.  If you are age 47 or younger, your body mass index should be between 19-25. Your Body mass index is 11.74 kg/m. If this is out of the aformentioned range listed, please consider follow up with your Primary Care Provider.   Your provider has requested that you go to the basement level for lab work before leaving today. Press "B" on the elevator. The lab is located at the first door on the left as you exit the elevator.   Start Miralax 17 grams in 8 oz water every day may take up to twice daily.    Continue Protonix 40 mg daily.   It has been recommended to you by your physician that you have a(n) colonoscopy completed. At this time we are unable to schedule. We have added you to Dr. Marvell Fuller hospital list and will call you when a date becomes available.

## 2019-09-13 NOTE — Progress Notes (Signed)
Subjective:    Patient ID: Johnny [name], male    DOB: 01/27/1956, 64 y.o.   MRN: [id number]  HPI Takari is a pleasant 64 year old African-American male, referred today by Denita Day FNP/[facility] with finding of positive FIT test. Patient is known to Dr. Leone Payor from 2018 when he was seen during hospitalization with hematemesis.  He had EGD at that time in January 2018 with finding of a 25 mm duodenal ulcer with visible vessel and 110 mm cratered esophageal ulcer. He has not had prior colonoscopy. He has history of hypertension, polysubstance abuse/EtOH/crack cocaine and chronic back pain.  History of cardiomyopathy with EF of 55% in 2018.  He says he had a remote cervical fracture, and has ongoing low back pain which limits his mobility.  He has weakness in his right lower extremity as well.  He comes in in a wheelchair today but says he can ambulate at home with a walker. Patient said he had not been seen by a physician for a few years but a few months ago was able to get back on Protonix 40 mg p.o. daily which she is taking.  He is also on an iron supplement once daily but is not certain whether or not he is iron deficient.  He says he has chronic problems with constipation and it had a severe episode recently.  He wound up taking multiple over-the-counter laxatives including a bottle of mag citrate and a box of chocolate Ex-Lax.  After that he had dark stool but got his bowel evacuated.  Over the past few bowel movements stools have been looking normal.  He denies any abdominal pain.  Appetite has been fine, no nausea. He completed fit test in October 2020 which was positive.  I do not have any other labs available today. Patient admits to ongoing EtOH use, generally 240 ounce beers daily, no hard liquor add as he had been using in the past  Review of Systems Pertinent positive and negative review of systems were noted in the above HPI section.  All other review of systems was otherwise  negative.  Outpatient Encounter Medications as of 09/13/2019  Medication Sig  . allopurinol (ZYLOPRIM) 100 MG tablet Take 1 tablet by mouth daily.  . ferrous sulfate 325 (65 FE) MG EC tablet Take 325 mg by mouth daily.  Marland Kitchen gabapentin (NEURONTIN) 300 MG capsule Take 1 capsule by mouth 3 (three) times daily.  Marland Kitchen HYDROcodone-acetaminophen (NORCO/VICODIN) 5-325 MG tablet Take 1-2 tablets by mouth every 6 (six) hours as needed for moderate pain or severe pain. (Patient taking differently: Take 1 tablet by mouth every 8 (eight) hours as needed for moderate pain or severe pain. )  . pantoprazole (PROTONIX) 20 MG tablet Take 1 tablet (20 mg total) by mouth daily for 30 days.  . [DISCONTINUED] amLODipine (NORVASC) 5 MG tablet Take 1 tablet (5 mg total) by mouth daily for 30 days.  Marland Kitchen amLODipine (NORVASC) 10 MG tablet Take 1 tablet by mouth daily.  . [DISCONTINUED] methocarbamol (ROBAXIN) 500 MG tablet Take 1 tablet (500 mg total) by mouth every 8 (eight) hours as needed for muscle spasms. (Patient not taking: Reported on 09/13/2019)   No facility-administered encounter medications on file as of 09/13/2019.   Allergies  Allergen Reactions  . Ketamine Other (See Comments)    Noted "bad reaction" during intubation 08/10/2016 - MD recommended to not use on this patient ever again.    Patient Active Problem List   Diagnosis Date  Noted  . Ulcer of heel ([place]) 09/08/2019  . Tobacco user 09/08/2019  . Spinal stenosis 09/08/2019  . Muscle weakness 09/08/2019  . Polysubstance abuse ([place]) 09/08/2019  . Gastroesophageal reflux disease without esophagitis 09/08/2019  . Chronic pain syndrome 09/08/2019  . Arthritis 09/08/2019  . Acute respiratory failure with hypoxia ([place])   . Essential hypertension   . ARF (acute renal failure) ([place])   . Encephalopathy   . Acute duodenal ulcer with hemorrhage   . Ulcer of esophagus without bleeding   . Acute upper GI bleeding   . Gastrointestinal hemorrhage 08/10/2016  . Acute  blood loss anemia    Social History   Socioeconomic History  . Marital status: Divorced    Spouse name: Not on file  . Number of children: Not on file  . Years of education: Not on file  . Highest education level: Not on file  Occupational History  . Not on file  Tobacco Use  . Smoking status: Current Some Day Smoker    Packs/day: 0.25    Types: Cigarettes  . Smokeless tobacco: Never Used  Substance and Sexual Activity  . Alcohol use: Yes    Comment: 40 oz beer daily  . Drug use: Yes    Comment: "crack"  . Sexual activity: Not on file  Other Topics Concern  . Not on file  Social History Narrative  . Not on file   Social Determinants of Health   Financial Resource Strain:   . Difficulty of Paying Living Expenses: Not on file  Food Insecurity:   . Worried About Charity fundraiser in the Last Year: Not on file  . Ran Out of Food in the Last Year: Not on file  Transportation Needs:   . Lack of Transportation (Medical): Not on file  . Lack of Transportation (Non-Medical): Not on file  Physical Activity:   . Days of Exercise per Week: Not on file  . Minutes of Exercise per Session: Not on file  Stress:   . Feeling of Stress : Not on file  Social Connections:   . Frequency of Communication with Friends and Family: Not on file  . Frequency of Social Gatherings with Friends and Family: Not on file  . Attends Religious Services: Not on file  . Active Member of Clubs or Organizations: Not on file  . Attends Archivist Meetings: Not on file  . Marital Status: Not on file  Intimate Partner Violence:   . Fear of Current or Ex-Partner: Not on file  . Emotionally Abused: Not on file  . Physically Abused: Not on file  . Sexually Abused: Not on file    Mr. Hellard family history includes Colon cancer in his paternal uncle; Heart disease in his mother.      Objective:    Vitals:   09/13/19 1004  BP: (!) 124/92  Pulse: 92  Temp: 97.8 F (36.6 C)  SpO2: 100%     Physical Exam Well-developed well-nourished older African-American male in no acute distress.  Patient comes in in a wheelchair  HEENT; nontraumatic normocephalic, EOMI, PE R LA, sclera anicteric. Oropharynx; Neck; supple, no JVD Cardiovascular; regular rate and rhythm with S1-S2, no murmur rub or gallop Pulmonary; Clear bilaterally Abdomen; soft, nontender, nondistended, no palpable mass or hepatosplenomegaly, bowel sounds are active Rectal; not done, Skin; benign exam, no jaundice rash or appreciable lesions Extremities; no clubbing cyanosis or edema skin warm and dry Neuro/Psych; alert and oriented x4, grossly nonfocal mood and  affect appropriate       Assessment & Plan:   #40 64 year old African-American male with positive FIT testing October 2020, no prior colonoscopy.  Rule out occult colon neoplasm.  #2 history of duodenal ulcer with visible vessel and small esophageal ulcer with GI bleed January 2018 3 hypertension 4.  Polysubstance abuse 5.  Chronic back pain with limited mobility-uses wheelchair or walker  Plan; patient will be scheduled for colonoscopy with Dr. Leone Payor at the hospital.  Procedure was discussed in detail with the patient including indications risks and benefits and he is agreeable to proceed. Continue Protonix 40 mg p.o. every morning CBC and iron studies today. For constipation advised MiraLAX 17 g in 8 ounces of water every day, may take 2- doses daily if needed  (Dr. Leone Payor does not have any hospital availability through March-patient will be placed on list for April, will try to get worked in sooner)  Sammuel Cooper PA-C 09/13/2019   Cc: Vladimir Crofts, FNP

## 2019-09-21 ENCOUNTER — Telehealth: Payer: Self-pay | Admitting: Physician Assistant

## 2019-09-21 NOTE — Telephone Encounter (Signed)
Hospital due to mobility issues - has to be ambulatory to be done in [facility]

## 2019-09-21 NOTE — Telephone Encounter (Signed)
I do not know this patient. Is he an intubation challenge due to the history of cervical fracture?

## 2019-09-22 NOTE — Telephone Encounter (Signed)
Tried calling [employer]. No doctor line. Tried to speak with someone through the patient line. "High call volume." Hopefully she will call again and have a better number?

## 2019-10-11 ENCOUNTER — Telehealth: Payer: Self-pay

## 2019-10-11 DIAGNOSIS — R195 Other fecal abnormalities: Secondary | ICD-10-CM

## 2019-10-11 NOTE — Telephone Encounter (Signed)
-----   Message from Evalee Jefferson, LPN sent at 12/10/3223  1:22 PM EST ----- Regarding: FW: Colonoscopy - hospital I am just a default. If you need me to do anything, please tell me. Thanks ----- Message ----- From: Peterson Ao Sent: 10/07/2019  12:37 PM EST To: Iva Boop, MD, Evalee Jefferson, LPN Subject: Colonoscopy - hospital                         Baldo Ash - I saw this pt in February - needs Colonoscopy for + FITtest - mobility limited- could not get on exam table - he got put on wait list for you for April .Marland Kitchen Can we get him scheduled ?  Priority 3  I think-  thanks ----- Message ----- From: [place], Cydne Grahn E, CMA Sent: 10/06/2019   1:17 PM EST To: Sammuel Cooper, PA-C  Hi, did you talk with Dr Leone Payor about this patient? And if so did he give you a priority number? Thanks

## 2019-10-11 NOTE — Telephone Encounter (Signed)
I have left a detailed message for Mr Stemm to call me back to discuss setting up his procedure on 11/09/2019 at [facility] ENDO.

## 2019-10-11 NOTE — Telephone Encounter (Signed)
According to the notes this patient has transportation issues and will need to be instructed over the phone. He has no email listed and his Earleen Reaper is pending. Also noted he is in a wheelchair.

## 2019-10-13 NOTE — Telephone Encounter (Signed)
Left detailed message on patients mobile # to call me back, that we are aiming for April 6th and I need to know if that will work for him.

## 2019-10-20 ENCOUNTER — Other Ambulatory Visit: Payer: Self-pay | Admitting: Internal Medicine

## 2019-10-20 DIAGNOSIS — R195 Other fecal abnormalities: Secondary | ICD-10-CM

## 2019-10-20 NOTE — Telephone Encounter (Signed)
Left a detailed message that 11/09/2019 is full and Dr Marvell Fuller next hospital date will be Monday May 10th. I requested a call back to set this up.

## 2019-10-20 NOTE — Telephone Encounter (Signed)
Patient's colon is set up for 12/13/2019 at 9:30 AM at [facility] ENDO and covid testing 12/09/2019 at 10:00AM at [facility] hospital. Will route to Amy Esterwood PA-C to update her.

## 2019-10-20 NOTE — Telephone Encounter (Signed)
This procedure has been moved up to 11/01/2019 , Dr Leone Payor said to put on his hospital week.

## 2019-10-20 NOTE — Telephone Encounter (Signed)
Patient called and we will work on getting him set up for May 10th 2021 and covid test the week before. He will have had both vaccine shots by then , he is unsure which brand Covid shot he is getting from his Dr.'s office.He said he has trouble reading so I will mail him instructions and he is going to call me hopefully when another relative can be with him to go over the instructions.

## 2019-10-21 ENCOUNTER — Telehealth: Payer: Self-pay

## 2019-10-21 DIAGNOSIS — R195 Other fecal abnormalities: Secondary | ICD-10-CM

## 2019-10-21 NOTE — Telephone Encounter (Signed)
Left message on machine to call back regarding a change in MD, location and date/time.  Pt has been rescheduled to Dr Christella Hartigan on 4/15 at [facility] 1030 am.  COVID testing on 4/12at 935 am.

## 2019-10-21 NOTE — Telephone Encounter (Signed)
-----   Message from Rachael Fee, MD sent at 10/21/2019  5:23 AM EDT ----- Regarding: RE: Colonoscopy - hospital I spoke with Dr. Reece Agar and we agree to switch the patient to my schedule.  I'll put Dahlton Hinde on this as well.  Looks like my first availability is 4/15 Thursday.  Thanks  ----- Message ----- From: [place], Patti E, CMA Sent: 10/20/2019   5:32 PM EDT To: Rachael Fee, MD Subject: RE: Colonoscopy - hospital                     Hello Sir, Dr Leone Payor already had me put him on for his week of 11/01/2019. ----- Message ----- From: Rachael Fee, MD Sent: 10/20/2019   1:20 PM EDT To: Sammuel Cooper, PA-C, Patti E [place], CMA, # Subject: RE: Colonoscopy - hospital                     Baldo Ash is right.  I saw him in the office in 2015.    Please put him on for one of my upcoming Thursdays, I think my first available time is 4/15.    Thanks  ----- Message ----- From: Iva Boop, MD Sent: 10/20/2019  12:47 PM EDT To: Sammuel Cooper, PA-C, Rachael Fee, MD, # Subject: FW: Colonoscopy - hospital                     PJ  Just put him on for my hospital week' ----- Message ----- From: Iva Boop, MD Sent: 10/19/2019   3:32 PM EDT To: Sammuel Cooper, PA-C, Evalee Jefferson, LPN Subject: RE: Colonoscopy - hospital                     My April slots are also full  Is there anyone else that can do this?  Not trying to dump but he did see Dr. Christella Hartigan before I ever saw him in hospital  But let's see if someone else has an opening.  If all else fails I can put him on for my hospital week Week of Good Friday but I already have people on  Sorry ----- Message ----- From: Peterson Ao Sent: 10/07/2019  12:37 PM EDT To: Iva Boop, MD, Evalee Jefferson, LPN Subject: Colonoscopy - hospital                         Baldo Ash - I saw this pt in February - needs Colonoscopy for + FITtest - mobility limited- could not get on exam table - he got put on wait list for you  for April .Marland Kitchen Can we get him scheduled ?  Priority 3  I think-  thanks ----- Message ----- From: [place], Patti E, CMA Sent: 10/06/2019   1:17 PM EST To: Sammuel Cooper, PA-C  Hi, did you talk with Dr Leone Payor about this patient? And if so did he give you a priority number? Thanks

## 2019-10-21 NOTE — Telephone Encounter (Signed)
Patient's procedure is going to be changed to Dr Rob Bunting for mid April.

## 2019-10-22 NOTE — Telephone Encounter (Signed)
Left message on machine to call back  Instructions mailed to the pt home  

## 2019-10-28 ENCOUNTER — Other Ambulatory Visit (HOSPITAL_COMMUNITY): Payer: Medicare HMO

## 2019-11-15 ENCOUNTER — Other Ambulatory Visit (HOSPITAL_COMMUNITY)
Admission: RE | Admit: 2019-11-15 | Discharge: 2019-11-15 | Disposition: A | Discharge status: Home / Self Care | Payer: Medicare HMO | Source: Ambulatory Visit | Attending: Gastroenterology | Admitting: Gastroenterology

## 2019-11-15 DIAGNOSIS — Z01812 Encounter for preprocedural laboratory examination: Secondary | ICD-10-CM | POA: Diagnosis present

## 2019-11-15 DIAGNOSIS — Z20822 Contact with and (suspected) exposure to covid-19: Secondary | ICD-10-CM | POA: Insufficient documentation

## 2019-11-15 LAB — SARS CORONAVIRUS 2 (TAT 6-24 HRS): SARS Coronavirus 2: NEGATIVE

## 2019-11-18 ENCOUNTER — Ambulatory Visit (HOSPITAL_COMMUNITY): Payer: Medicare HMO | Admitting: Anesthesiology

## 2019-11-18 ENCOUNTER — Encounter (HOSPITAL_COMMUNITY): Payer: Self-pay | Admitting: Gastroenterology

## 2019-11-18 ENCOUNTER — Ambulatory Visit (HOSPITAL_COMMUNITY)
Admission: RE | Admit: 2019-11-18 | Discharge: 2019-11-18 | Disposition: A | Discharge status: Home / Self Care | Payer: Medicare HMO | Attending: Gastroenterology | Admitting: Gastroenterology

## 2019-11-18 ENCOUNTER — Other Ambulatory Visit: Payer: Self-pay

## 2019-11-18 ENCOUNTER — Encounter (HOSPITAL_COMMUNITY)
Admission: RE | Disposition: A | Discharge status: Home / Self Care | Payer: Self-pay | Source: Home / Self Care | Attending: Gastroenterology

## 2019-11-18 DIAGNOSIS — K449 Diaphragmatic hernia without obstruction or gangrene: Secondary | ICD-10-CM | POA: Diagnosis not present

## 2019-11-18 DIAGNOSIS — X58XXXA Exposure to other specified factors, initial encounter: Secondary | ICD-10-CM | POA: Insufficient documentation

## 2019-11-18 DIAGNOSIS — K219 Gastro-esophageal reflux disease without esophagitis: Secondary | ICD-10-CM | POA: Diagnosis not present

## 2019-11-18 DIAGNOSIS — M1990 Unspecified osteoarthritis, unspecified site: Secondary | ICD-10-CM | POA: Diagnosis not present

## 2019-11-18 DIAGNOSIS — I10 Essential (primary) hypertension: Secondary | ICD-10-CM | POA: Diagnosis not present

## 2019-11-18 DIAGNOSIS — F17210 Nicotine dependence, cigarettes, uncomplicated: Secondary | ICD-10-CM | POA: Insufficient documentation

## 2019-11-18 DIAGNOSIS — K0889 Other specified disorders of teeth and supporting structures: Secondary | ICD-10-CM | POA: Diagnosis not present

## 2019-11-18 DIAGNOSIS — Z888 Allergy status to other drugs, medicaments and biological substances status: Secondary | ICD-10-CM | POA: Insufficient documentation

## 2019-11-18 DIAGNOSIS — D509 Iron deficiency anemia, unspecified: Secondary | ICD-10-CM | POA: Insufficient documentation

## 2019-11-18 DIAGNOSIS — R195 Other fecal abnormalities: Secondary | ICD-10-CM

## 2019-11-18 DIAGNOSIS — K5730 Diverticulosis of large intestine without perforation or abscess without bleeding: Secondary | ICD-10-CM | POA: Diagnosis not present

## 2019-11-18 DIAGNOSIS — T183XXA Foreign body in small intestine, initial encounter: Secondary | ICD-10-CM | POA: Insufficient documentation

## 2019-11-18 HISTORY — PX: COLONOSCOPY WITH PROPOFOL: SHX5780

## 2019-11-18 HISTORY — PX: ESOPHAGOGASTRODUODENOSCOPY (EGD) WITH PROPOFOL: SHX5813

## 2019-11-18 SURGERY — COLONOSCOPY WITH PROPOFOL
Anesthesia: Monitor Anesthesia Care

## 2019-11-18 MED ORDER — LACTATED RINGERS IV SOLN
INTRAVENOUS | Status: AC | PRN
  Administered 2019-11-18: 1000 mL via INTRAVENOUS

## 2019-11-18 MED ORDER — SODIUM CHLORIDE 0.9 % IV SOLN: INTRAVENOUS | Status: DC

## 2019-11-18 MED ORDER — PROPOFOL 10 MG/ML IV BOLUS
INTRAVENOUS | Status: AC
  Filled 2019-11-18: qty 20

## 2019-11-18 MED ORDER — LIDOCAINE 2% (20 MG/ML) 5 ML SYRINGE
INTRAMUSCULAR | Status: DC | PRN
  Administered 2019-11-18: 75 mg via INTRAVENOUS

## 2019-11-18 MED ORDER — PROPOFOL 500 MG/50ML IV EMUL
INTRAVENOUS | Status: DC | PRN
  Administered 2019-11-18: 125 ug/kg/min via INTRAVENOUS

## 2019-11-18 MED ORDER — PROPOFOL 10 MG/ML IV BOLUS
INTRAVENOUS | Status: DC | PRN
  Administered 2019-11-18 (×2): 30 mg via INTRAVENOUS
  Administered 2019-11-18: 20 mg via INTRAVENOUS

## 2019-11-18 MED ORDER — PROPOFOL 500 MG/50ML IV EMUL
INTRAVENOUS | Status: AC
  Filled 2019-11-18: qty 50

## 2019-11-18 SURGICAL SUPPLY — 22 items

## 2019-11-18 NOTE — Transfer of Care (Signed)
Immediate Anesthesia Transfer of Care Note  Patient: Johnny [name]  Procedure(s) Performed: COLONOSCOPY WITH PROPOFOL (N/A ) ESOPHAGOGASTRODUODENOSCOPY (EGD) WITH PROPOFOL (N/A )  Patient Location: PACU and Endoscopy Unit  Anesthesia Type:MAC  Level of Consciousness: awake and alert   Airway & Oxygen Therapy: Patient Spontanous Breathing and Patient connected to nasal cannula oxygen  Post-op Assessment: Report given to RN and Post -op Vital signs reviewed and stable  Post vital signs: Reviewed and stable  Last Vitals:  Vitals Value Taken Time  BP    Temp    Pulse    Resp    SpO2      Last Pain:  Vitals:   11/18/19 1001  TempSrc: Oral  PainSc: 0-No pain         Complications: No apparent anesthesia complications

## 2019-11-18 NOTE — Anesthesia Preprocedure Evaluation (Signed)
Anesthesia Evaluation  Patient identified by MRN, date of birth, ID band Patient awake    Reviewed: Allergy & Precautions, H&P , NPO status , Patient's Chart, lab work & pertinent test results  History of Anesthesia Complications Negative for: history of anesthetic complications  Airway Mallampati: II  TM Distance: >3 FB Neck ROM: Full    Dental  (+) Poor Dentition, Chipped, Missing,    Pulmonary Current Smoker and Patient abstained from smoking.,    Pulmonary exam normal breath sounds clear to auscultation       Cardiovascular hypertension, Pt. on medications Normal cardiovascular exam Rhythm:Regular Rate:Normal     Neuro/Psych PSYCHIATRIC DISORDERS negative neurological ROS     GI/Hepatic Neg liver ROS, PUD, GERD  Medicated and Controlled,(+)     substance abuse  alcohol use and cocaine use, He reports that the last use of crack cocaine was 2 weeks prior to today. He denies any signs or symptoms of withdrawal. He reports daily ETOH use of a 40oz and 6-8 can beers. He last drank beer yesterday afternoon   Endo/Other  negative endocrine ROS  Renal/GU Renal disease     Musculoskeletal  (+) Arthritis ,   Abdominal   Peds  Hematology  (+) Blood dyscrasia, anemia ,   Anesthesia Other Findings   Reproductive/Obstetrics                             Anesthesia Physical  Anesthesia Plan  ASA: III  Anesthesia Plan: MAC   Post-op Pain Management:    Induction: Intravenous  PONV Risk Score and Plan: 0 and Propofol infusion, TIVA and Treatment may vary due to age or medical condition  Airway Management Planned: Natural Airway  Additional Equipment:   Intra-op Plan:   Post-operative Plan:   Informed Consent: I have reviewed the patients History and Physical, chart, labs and discussed the procedure including the risks, benefits and alternatives for the proposed anesthesia with the  patient or authorized representative who has indicated his/her understanding and acceptance.     Dental advisory given  Plan Discussed with: CRNA  Anesthesia Plan Comments:         Anesthesia Quick Evaluation

## 2019-11-18 NOTE — Discharge Instructions (Signed)
YOU HAD AN ENDOSCOPIC PROCEDURE TODAY: Refer to the procedure report and other information in the discharge instructions given to you for any specific questions about what was found during the examination. If this information does not answer your questions, please call [place] office at [phone] to clarify.  ° °YOU SHOULD EXPECT: Some feelings of bloating in the abdomen. Passage of more gas than usual. Walking can help get rid of the air that was put into your GI tract during the procedure and reduce the bloating. If you had a lower endoscopy (such as a colonoscopy or flexible sigmoidoscopy) you may notice spotting of blood in your stool or on the toilet paper. Some abdominal soreness may be present for a day or two, also. ° °DIET: Your first meal following the procedure should be a light meal and then it is ok to progress to your normal diet. A half-sandwich or bowl of soup is an example of a good first meal. Heavy or fried foods are harder to digest and may make you feel nauseous or bloated. Drink plenty of fluids but you should avoid alcoholic beverages for 24 hours.  ° °ACTIVITY: Your care partner should take you home directly after the procedure. You should plan to take it easy, moving slowly for the rest of the day. You can resume normal activity the day after the procedure however YOU SHOULD NOT DRIVE, use power tools, machinery or perform tasks that involve climbing or major physical exertion for 24 hours (because of the sedation medicines used during the test).  ° °SYMPTOMS TO REPORT IMMEDIATELY: °A gastroenterologist can be reached at any hour. Please call [phone]  for any of the following symptoms:  °Following lower endoscopy (colonoscopy, flexible sigmoidoscopy) °Excessive amounts of blood in the stool  °Significant tenderness, worsening of abdominal pains  °Swelling of the abdomen that is new, acute  °Fever of 100° or higher  °Following upper endoscopy (EGD, EUS, ERCP, esophageal  dilation) °Vomiting of blood or coffee ground material  °New, significant abdominal pain  °New, significant chest pain or pain under the shoulder blades  °Painful or persistently difficult swallowing  °New shortness of breath  °Black, tarry-looking or red, bloody stools ° °FOLLOW UP:  °If any biopsies were taken you will be contacted by phone or by letter within the next 1-3 weeks. Call [phone]  if you have not heard about the biopsies in 3 weeks.  °Please also call with any specific questions about appointments or follow up tests.  °

## 2019-11-18 NOTE — Anesthesia Procedure Notes (Signed)
Procedure Name: MAC Date/Time: 11/18/2019 10:47 AM Performed by: Lollie Sails, CRNA Pre-anesthesia Checklist: Patient identified, Emergency Drugs available, Suction available, Patient being monitored and Timeout performed Oxygen Delivery Method: Nasal cannula

## 2019-11-18 NOTE — Op Note (Signed)
[facility] Patient Name: Johnny [name] Procedure Date: 11/18/2019 MRN: [id number] Attending MD: Rachael Fee , MD Date of Birth: Jul 01, 1956 CSN: [id number] Age: 64 Admit Type: Outpatient Procedure:                Colonoscopy Indications:              Heme positive stool, IDA (mild) Providers:                Rachael Fee, MD, Weston Settle, RN, Wanita Chamberlain, Technician Referring MD:              Medicines:                Monitored Anesthesia Care Complications:            No immediate complications. Estimated blood loss:                            None. Estimated Blood Loss:     Estimated blood loss: none. Procedure:                Pre-Anesthesia Assessment:                           - Prior to the procedure, a History and Physical                            was performed, and patient medications and                            allergies were reviewed. The patient's tolerance of                            previous anesthesia was also reviewed. The risks                            and benefits of the procedure and the sedation                            options and risks were discussed with the patient.                            All questions were answered, and informed consent                            was obtained. Prior Anticoagulants: The patient has                            taken no previous anticoagulant or antiplatelet                            agents. ASA Grade Assessment: III - A patient with                            severe  systemic disease. After reviewing the risks                            and benefits, the patient was deemed in                            satisfactory condition to undergo the procedure.                           After obtaining informed consent, the colonoscope                            was passed under direct vision. Throughout the                            procedure, the patient's blood pressure,  pulse, and                            oxygen saturations were monitored continuously. The                            CF-HQ190L ([id number]) Olympus colonoscope was                            introduced through the anus and advanced to the the                            cecum, identified by appendiceal orifice and                            ileocecal valve. The colonoscopy was performed                            without difficulty. The patient tolerated the                            procedure well. The quality of the bowel                            preparation was good. The ileocecal valve,                            appendiceal orifice, and rectum were photographed. Scope In: 10:51:59 AM Scope Out: 10:58:58 AM Scope Withdrawal Time: 0 hours 2 minutes 26 seconds  Total Procedure Duration: 0 hours 6 minutes 59 seconds  Findings:      Multiple small-mouthed diverticula were found in the left colon.      The exam was otherwise without abnormality on direct and retroflexion       views. Impression:               - Diverticulosis in the left colon.                           - The examination was otherwise normal on direct  and retroflexion views.                           - No polyps or cancers. Moderate Sedation:      Not Applicable - Patient had care per Anesthesia. Recommendation:           - EGD now.                           - Repeat colonoscopy in 10 years for screening. Procedure Code(s):        --- Professional ---                           [phone], Colonoscopy, flexible; diagnostic, including                            collection of specimen(s) by brushing or washing,                            when performed (separate procedure) Diagnosis Code(s):        --- Professional ---                           R19.5, Other fecal abnormalities                           K57.30, Diverticulosis of large intestine without                            perforation or abscess  without bleeding CPT copyright 2019 American Medical Association. All rights reserved. The codes documented in this report are preliminary and upon coder review may  be revised to meet current compliance requirements. Milus Banister, MD 11/18/2019 11:01:17 AM This report has been signed electronically. Number of Addenda: 0

## 2019-11-18 NOTE — H&P (Signed)
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  Constitutional:  No unintentional weight loss   Past Medical History:  Diagnosis Date  . Alcoholism (HCC)   . Arthritis   . Broken neck (HCC)   . Drug abuse (HCC)    crack  . GERD (gastroesophageal reflux disease)   . H/O tinnitus   . Hydrocele   . Hypertension    no meds  . Loose, teeth     Past Surgical History:  Procedure Laterality Date  . ESOPHAGOGASTRODUODENOSCOPY N/A 08/11/2016   Procedure: ESOPHAGOGASTRODUODENOSCOPY (EGD);  Surgeon: Carl E Gessner, MD;  Location: [facility];  Service: Endoscopy;  Laterality: N/A;  In ICU  . HEMORRHOID SURGERY     as a teenager  . HYDROCELE EXCISION Right 04/15/2014   Procedure: RIGHT HYDROCELECTOMY ADULT;  Surgeon: Benjamin W Herrick, MD;  Location: [facility];  Service: Urology;  Laterality: Right;  . IR GENERIC HISTORICAL  08/14/2016   IR ANGIOGRAM SELECTIVE EACH ADDITIONAL VESSEL 08/14/2016 Michael Shick, MD [facility]  . IR GENERIC HISTORICAL  08/14/2016   IR US GUIDE VASC ACCESS RIGHT 08/14/2016 Michael Shick, MD [facility]  . IR GENERIC HISTORICAL  08/14/2016   IR ANGIOGRAM SELECTIVE EACH ADDITIONAL VESSEL 08/14/2016 Michael Shick, MD [facility]  . IR GENERIC HISTORICAL  08/14/2016   IR ANGIOGRAM FOLLOW UP STUDY 08/14/2016 Michael Shick, MD [facility]  . IR GENERIC HISTORICAL  08/14/2016   IR ANGIOGRAM VISCERAL SELECTIVE 08/14/2016 Michael Shick, MD [facility]  . IR GENERIC HISTORICAL  08/14/2016   IR EMBO ART  VEN HEMORR LYMPH EXTRAV  INC GUIDE ROADMAPPING 08/14/2016 Michael Shick, MD [facility]  . NECK SURGERY  1996  . TESTICLE SURGERY      Current Facility-Administered Medications  Medication Dose Route Frequency Provider Last Rate Last Admin  . 0.9 %  sodium chloride infusion   Intravenous Continuous Gessner, Carl E, MD      . lactated ringers infusion    Continuous PRN Yvett Rossel P, MD  20 mL/hr at 11/18/19 1014 1,000 mL at 11/18/19 1014    Allergies as of 10/20/2019 - Review Complete 09/13/2019  Allergen Reaction Noted  . Ketamine Other (See Comments) 08/10/2016    Family History  Problem Relation Age of Onset  . Colon cancer Paternal Uncle   . Heart disease Mother        father    Social History   Socioeconomic History  . Marital status: Divorced    Spouse name: Not on file  . Number of children: Not on file  . Years of education: Not on file  . Highest education level: Not on file  Occupational History  . Not on file  Tobacco Use  . Smoking status: Current Some Day Smoker    Packs/day: 0.25    Types: Cigarettes  . Smokeless tobacco: Never Used  Substance and Sexual Activity  . Alcohol use: Yes    Comment: 40 oz beer daily  . Drug use: Yes    Comment: "crack"  . Sexual activity: Not on file  Other Topics Concern  . Not on file  Social History Narrative  . Not on file   Social Determinants of Health   Financial Resource Strain:   . Difficulty of Paying Living Expenses:   Food Insecurity:   . Worried About Running Out of Food in the Last Year:   . Ran Out   of Food in the Last Year:   Transportation Needs:   . Freight forwarder (Medical):   Marland Kitchen Lack of Transportation (Non-Medical):   Physical Activity:   . Days of Exercise per Week:   . Minutes of Exercise per Session:   Stress:   . Feeling of Stress :   Social Connections:   . Frequency of Communication with Friends and Family:   . Frequency of Social Gatherings with Friends and Family:   . Attends Religious Services:   . Active Member of Clubs or Organizations:   . Attends Banker Meetings:   Marland Kitchen Marital Status:   Intimate Partner Violence:   . Fear of Current or Ex-Partner:   . Emotionally Abused:   Marland Kitchen Physically Abused:   . Sexually Abused:      Physical Exam: BP (!) 170/108   Pulse 92   Temp 98 F (36.7 C) (Oral)   Resp 17   Wt 105.7 kg   SpO2 100%   BMI  30.74 kg/m  Constitutional: generally well-appearing Psychiatric: alert and oriented x3 Abdomen: soft, nontender, nondistended, no obvious ascites, no peritoneal signs, normal bowel sounds No peripheral edema noted in lower extremities  Assessment and plan: 64 y.o. 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  Rob Bunting, MD [facility] 11/18/2019, 10:31 AM

## 2019-11-18 NOTE — Anesthesia Postprocedure Evaluation (Signed)
Anesthesia Post Note  Patient: Johnny [name]  Procedure(s) Performed: COLONOSCOPY WITH PROPOFOL (N/A ) ESOPHAGOGASTRODUODENOSCOPY (EGD) WITH PROPOFOL (N/A )     Patient location during evaluation: PACU Anesthesia Type: MAC Level of consciousness: awake and alert Pain management: pain level controlled Vital Signs Assessment: post-procedure vital signs reviewed and stable Respiratory status: spontaneous breathing Cardiovascular status: stable Anesthetic complications: no    Last Vitals:  Vitals:   11/18/19 1130 11/18/19 1140  BP: (!) 143/87 (!) 158/97  Pulse: 89 81  Resp: (!) 24 15  Temp:    SpO2: 95% 98%    Last Pain:  Vitals:   11/18/19 1140  TempSrc:   PainSc: 0-No pain                 Nolon Nations

## 2019-11-18 NOTE — Op Note (Addendum)
[facility] Patient Name: Johnny [name] Procedure Date: 11/18/2019 MRN: [id number] Attending MD: Rachael Feeaniel P Reuven Braver , MD Date of Birth: 03/08/1956 CSN: [id number] Age: 64 Admit Type: Outpatient Procedure:                Upper GI endoscopy Indications:              Iron deficiency anemia, Heme positive stool; 08/11/16                            EGD Dr. Leone PayorGessner: ?Solitary, non-bleeding DU with                            VV, bicapped and clipped. ?Non-bleediing esophageal                            ulcer. Esophageal nodule, ? NGT trauma.                           08/14/16 recurrent bleeding. IR angiography. s/p                            coil embolization of GDA pseudoaneurysm. H pylori +                            was treated with prevpac type Abx Providers:                Rachael Feeaniel P. Edom Schmuhl, MD, Weston SettleBethany Thacker, RN, Wanita ChamberlainJalen                            Miles, Technician Referring MD:              Medicines:                Monitored Anesthesia Care Complications:            No immediate complications. Estimated Blood Loss:      Procedure:                Pre-Anesthesia Assessment:                           - Prior to the procedure, a History and Physical                            was performed, and patient medications and                            allergies were reviewed. The patient's tolerance of                            previous anesthesia was also reviewed. The risks                            and benefits of the procedure and the sedation                            options and risks  were discussed with the patient.                            All questions were answered, and informed consent                            was obtained. Prior Anticoagulants: The patient has                            taken no previous anticoagulant or antiplatelet                            agents. ASA Grade Assessment: III - A patient with                            severe systemic disease. After  reviewing the risks                            and benefits, the patient was deemed in                            satisfactory condition to undergo the procedure.                           - Prior to the procedure, a History and Physical                            was performed, and patient medications and                            allergies were reviewed. The patient's tolerance of                            previous anesthesia was also reviewed. The risks                            and benefits of the procedure and the sedation                            options and risks were discussed with the patient.                            All questions were answered, and informed consent                            was obtained. Prior Anticoagulants: The patient has                            taken no previous anticoagulant or antiplatelet                            agents. ASA Grade Assessment: IV - A patient with  severe systemic disease that is a constant threat                            to life. After reviewing the risks and benefits,                            the patient was deemed in satisfactory condition to                            undergo the procedure.                           After obtaining informed consent, the endoscope was                            passed under direct vision. Throughout the                            procedure, the patient's blood pressure, pulse, and                            oxygen saturations were monitored continuously. The                            GIF-H190 ([id number]) Olympus gastroscope was                            introduced through the mouth, and advanced to the                            duodenal bulb. The upper GI endoscopy was                            accomplished without difficulty. The patient                            tolerated the procedure well. Scope In: Scope Out: Findings:      The esophagus was normal.       Small to medium sized hiatal hernia      There was a foreign body that appeared ingrown into the duodenal bulb;       this looked like a leather piece of jewelry with metal clips, perhaps a       bracelet? There was ulceration and friability of the duodenum mucosa at       the site, clearly foreign body reaction.      The examination was otherwise normal. Impression:               - Hiatal hernia.                           - There was a foreign body that appeared ingrown                            into the duodenal bulb; this looked like a leather  piece of jewelry with metal clips, perhaps a                            bracelet? There was ulceration and friability of                            the duodenum mucosa at the site, clearly foreign                            body reaction. This certainly could be causing his                            heme + IDA. Moderate Sedation:      Not Applicable - Patient had care per Anesthesia. Recommendation:           - Patient has a contact number available for                            emergencies. The signs and symptoms of potential                            delayed complications were discussed with the                            patient. Return to normal activities tomorrow.                            Written discharge instructions were provided to the                            patient.                           - Resume previous diet.                           - Continue present medications.                           - Dr. Ardis Hughs' will consider repeat EGD to attempt                            foreign body removal. Will prefer General                            anesthesia in that case. Procedure Code(s):        --- Professional ---                           [phone], Esophagogastroduodenoscopy, flexible,                            transoral; diagnostic, including collection of                            specimen(s) by  brushing or washing, when performed                            (separate procedure) Diagnosis Code(s):        --- Professional ---                           K44.9, Diaphragmatic hernia without obstruction or                            gangrene                           D50.9, Iron deficiency anemia, unspecified                           R19.5, Other fecal abnormalities CPT copyright 2019 American Medical Association. All rights reserved. The codes documented in this report are preliminary and upon coder review may  be revised to meet current compliance requirements. Rachael Fee, MD 11/18/2019 11:32:26 AM This report has been signed electronically. Number of Addenda: 0

## 2019-11-18 NOTE — H&P (View-Only) (Signed)
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  Constitutional:  No unintentional weight loss   Past Medical History:  Diagnosis Date  . Alcoholism ([place])   . Arthritis   . Broken neck ([place])   . Drug abuse ([place])    crack  . GERD (gastroesophageal reflux disease)   . H/O tinnitus   . Hydrocele   . Hypertension    no meds  . Loose, teeth     Past Surgical History:  Procedure Laterality Date  . ESOPHAGOGASTRODUODENOSCOPY N/A 08/11/2016   Procedure: ESOPHAGOGASTRODUODENOSCOPY (EGD);  Surgeon: Gatha Mayer, MD;  Location: [facility] ENDOSCOPY;  Service: Endoscopy;  Laterality: N/A;  In ICU  . HEMORRHOID SURGERY     as a teenager  . HYDROCELE EXCISION Right 04/15/2014   Procedure: RIGHT HYDROCELECTOMY ADULT;  Surgeon: Ardis Hughs, MD;  Location: [facility];  Service: Urology;  Laterality: Right;  . IR GENERIC HISTORICAL  08/14/2016   IR ANGIOGRAM SELECTIVE EACH ADDITIONAL VESSEL 08/14/2016 Greggory Keen, MD [facility]  . IR GENERIC HISTORICAL  08/14/2016   IR US GUIDE VASC ACCESS RIGHT 08/14/2016 Greggory Keen, MD [facility]  . IR GENERIC HISTORICAL  08/14/2016   IR ANGIOGRAM SELECTIVE EACH ADDITIONAL VESSEL 08/14/2016 Greggory Keen, MD [facility]  . IR GENERIC HISTORICAL  08/14/2016   IR ANGIOGRAM FOLLOW UP STUDY 08/14/2016 Greggory Keen, MD [facility]  . IR GENERIC HISTORICAL  08/14/2016   IR ANGIOGRAM VISCERAL SELECTIVE 08/14/2016 Greggory Keen, MD [facility]  . IR GENERIC HISTORICAL  08/14/2016   IR EMBO ART  VEN HEMORR LYMPH EXTRAV  INC GUIDE ROADMAPPING 08/14/2016 Greggory Keen, MD [facility]  . NECK SURGERY  1996  . TESTICLE SURGERY      Current Facility-Administered Medications  Medication Dose Route Frequency Provider Last Rate Last Admin  . 0.9 %  sodium chloride infusion   Intravenous Continuous Gatha Mayer, MD      . lactated ringers infusion    Continuous PRN Milus Banister, MD  20 mL/hr at 11/18/19 1014 1,000 mL at 11/18/19 1014    Allergies as of 10/20/2019 - Review Complete 09/13/2019  Allergen Reaction Noted  . Ketamine Other (See Comments) 08/10/2016    Family History  Problem Relation Age of Onset  . Colon cancer Paternal Uncle   . Heart disease Mother        father    Social History   Socioeconomic History  . Marital status: Divorced    Spouse name: Not on file  . Number of children: Not on file  . Years of education: Not on file  . Highest education level: Not on file  Occupational History  . Not on file  Tobacco Use  . Smoking status: Current Some Day Smoker    Packs/day: 0.25    Types: Cigarettes  . Smokeless tobacco: Never Used  Substance and Sexual Activity  . Alcohol use: Yes    Comment: 40 oz beer daily  . Drug use: Yes    Comment: "crack"  . Sexual activity: Not on file  Other Topics Concern  . Not on file  Social History Narrative  . Not on file   Social Determinants of Health   Financial Resource Strain:   . Difficulty of Paying Living Expenses:   Food Insecurity:   . Worried About Charity fundraiser in the Last Year:   . [employer]  of Food in the Last Year:   Transportation Needs:   . Freight forwarder (Medical):   Marland Kitchen Lack of Transportation (Non-Medical):   Physical Activity:   . Days of Exercise per Week:   . Minutes of Exercise per Session:   Stress:   . Feeling of Stress :   Social Connections:   . Frequency of Communication with Friends and Family:   . Frequency of Social Gatherings with Friends and Family:   . Attends Religious Services:   . Active Member of Clubs or Organizations:   . Attends Banker Meetings:   Marland Kitchen Marital Status:   Intimate Partner Violence:   . Fear of Current or Ex-Partner:   . Emotionally Abused:   Marland Kitchen Physically Abused:   . Sexually Abused:      Physical Exam: BP (!) 170/108   Pulse 92   Temp 98 F (36.7 C) (Oral)   Resp 17   Wt 105.7 kg   SpO2 100%   BMI  30.74 kg/m  Constitutional: generally well-appearing Psychiatric: alert and oriented x3 Abdomen: soft, nontender, nondistended, no obvious ascites, no peritoneal signs, normal bowel sounds No peripheral edema noted in lower extremities  Assessment and plan: 64 y.o. 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  Rob Bunting, MD [facility] 11/18/2019, 10:31 AM

## 2019-11-19 ENCOUNTER — Encounter: Payer: Self-pay | Admitting: *Deleted

## 2019-11-22 ENCOUNTER — Other Ambulatory Visit: Payer: Self-pay

## 2019-11-22 ENCOUNTER — Telehealth: Payer: Self-pay | Admitting: Gastroenterology

## 2019-11-22 DIAGNOSIS — T189XXS Foreign body of alimentary tract, part unspecified, sequela: Secondary | ICD-10-CM

## 2019-11-22 NOTE — Telephone Encounter (Signed)
I spoke with interventional radiology about the foreign bodies in his duodenum.  The metal portion which appears embedded in the wall of his duodenum is almost certainly related to gastroduodenal artery coil that was placed 3 years ago.  It would be potentially dangerous to try to remove this metal coil.  It could be up to 20 cm long and on the other side is his gastroduodenal artery.  The other part of the foreign body appears to be a piece of jewelry, perhaps made of leather that is caught on the coil.  I think I should be able to remove that.  We will possibly have to cut with endoscopic scissors.  I discussed this with him and the fact that he is having intermittently dark stools.  His intermittent dark stools could be from the iron that he takes only periodically.  I recommended that he take iron every single day for his chronic iron deficiency anemia.  Patty, he needs CBC in the next day or 2.  He also needs repeat EGD at [facility] long, my first available appointment, for foreign body removal from duodenum, possibly using endoscopic scissors device.  This will need to be with general anesthesia.  He was difficult to sedate even with deep sedation, propofol, likely from his polysubstance, cocaine and alcohol use

## 2019-11-22 NOTE — Telephone Encounter (Signed)
The pt has been advised that he needs CBC in the next few days and EGD on 5/13 at 730 am [facility]. He needs COVID on 5/10 at 955.  The pt has been advised of the information and verbalized understanding.   I will also mail the information to the pt home.

## 2019-11-22 NOTE — Telephone Encounter (Signed)
Left message on machine to call back  

## 2019-11-22 NOTE — Telephone Encounter (Signed)
Dr Christella Hartigan the pt is calling about the "foreign body" in the stomach.  He wants to know what he is suppose to do?  Please advise

## 2019-11-26 ENCOUNTER — Other Ambulatory Visit (INDEPENDENT_AMBULATORY_CARE_PROVIDER_SITE_OTHER): Payer: Medicare HMO

## 2019-11-26 DIAGNOSIS — T189XXS Foreign body of alimentary tract, part unspecified, sequela: Secondary | ICD-10-CM | POA: Diagnosis not present

## 2019-11-26 DIAGNOSIS — Z1812 Retained nonmagnetic metal fragments: Secondary | ICD-10-CM

## 2019-11-26 DIAGNOSIS — Z1839 Other retained organic fragments: Secondary | ICD-10-CM | POA: Diagnosis not present

## 2019-11-26 LAB — CBC WITH DIFFERENTIAL/PLATELET
Basophils Absolute: 0.1 10*3/uL (ref 0.0–0.1)
Basophils Relative: 0.9 % (ref 0.0–3.0)
Eosinophils Absolute: 0.1 10*3/uL (ref 0.0–0.7)
Eosinophils Relative: 1.2 % (ref 0.0–5.0)
HCT: 36.7 % — ABNORMAL LOW (ref 39.0–52.0)
Hemoglobin: 11.3 g/dL — ABNORMAL LOW (ref 13.0–17.0)
Lymphocytes Relative: 36.6 % (ref 12.0–46.0)
Lymphs Abs: 2.4 10*3/uL (ref 0.7–4.0)
MCHC: 30.9 g/dL (ref 30.0–36.0)
MCV: 83.8 fl (ref 78.0–100.0)
Monocytes Absolute: 0.4 10*3/uL (ref 0.1–1.0)
Monocytes Relative: 6.6 % (ref 3.0–12.0)
Neutro Abs: 3.6 10*3/uL (ref 1.4–7.7)
Neutrophils Relative %: 54.7 % (ref 43.0–77.0)
Platelets: 280 10*3/uL (ref 150.0–400.0)
RBC: 4.37 Mil/uL (ref 4.22–5.81)
RDW: 20.9 % — ABNORMAL HIGH (ref 11.5–15.5)
WBC: 6.6 10*3/uL (ref 4.0–10.5)

## 2019-11-29 ENCOUNTER — Telehealth: Payer: Self-pay | Admitting: Gastroenterology

## 2019-11-29 NOTE — Telephone Encounter (Signed)
Rachael Fee, MD  Loretha Stapler, RN    Please call the patient. His cbc is improving. He needs to continue iron once daily. Already scheduled for repeat EGD at [facility] in 2-3 weeks to remove the foreign body in his duodenum.   Thanks

## 2019-11-29 NOTE — Telephone Encounter (Signed)
The pt has been advised and will continue oral iron and proceed with plans for EGD.

## 2019-12-09 ENCOUNTER — Other Ambulatory Visit (HOSPITAL_COMMUNITY): Payer: Medicare HMO

## 2019-12-13 ENCOUNTER — Other Ambulatory Visit (HOSPITAL_COMMUNITY)
Admission: RE | Admit: 2019-12-13 | Discharge: 2019-12-13 | Disposition: A | Discharge status: Home / Self Care | Payer: Medicare HMO | Source: Ambulatory Visit | Attending: Gastroenterology | Admitting: Gastroenterology

## 2019-12-13 DIAGNOSIS — Z20822 Contact with and (suspected) exposure to covid-19: Secondary | ICD-10-CM | POA: Insufficient documentation

## 2019-12-13 DIAGNOSIS — Z01812 Encounter for preprocedural laboratory examination: Secondary | ICD-10-CM | POA: Insufficient documentation

## 2019-12-13 LAB — SARS CORONAVIRUS 2 (TAT 6-24 HRS): SARS Coronavirus 2: NEGATIVE

## 2019-12-16 ENCOUNTER — Ambulatory Visit (HOSPITAL_COMMUNITY): Payer: Medicare HMO | Admitting: Anesthesiology

## 2019-12-16 ENCOUNTER — Telehealth: Payer: Self-pay

## 2019-12-16 ENCOUNTER — Ambulatory Visit (HOSPITAL_COMMUNITY)
Admission: RE | Admit: 2019-12-16 | Discharge: 2019-12-16 | Disposition: A | Discharge status: Home / Self Care | Payer: Medicare HMO | Attending: Gastroenterology | Admitting: Gastroenterology

## 2019-12-16 ENCOUNTER — Encounter (HOSPITAL_COMMUNITY)
Admission: RE | Disposition: A | Discharge status: Home / Self Care | Payer: Self-pay | Source: Home / Self Care | Attending: Gastroenterology

## 2019-12-16 ENCOUNTER — Encounter (HOSPITAL_COMMUNITY): Payer: Self-pay | Admitting: Gastroenterology

## 2019-12-16 DIAGNOSIS — I10 Essential (primary) hypertension: Secondary | ICD-10-CM | POA: Insufficient documentation

## 2019-12-16 DIAGNOSIS — K269 Duodenal ulcer, unspecified as acute or chronic, without hemorrhage or perforation: Secondary | ICD-10-CM | POA: Diagnosis not present

## 2019-12-16 DIAGNOSIS — K219 Gastro-esophageal reflux disease without esophagitis: Secondary | ICD-10-CM | POA: Insufficient documentation

## 2019-12-16 DIAGNOSIS — T189XXA Foreign body of alimentary tract, part unspecified, initial encounter: Secondary | ICD-10-CM

## 2019-12-16 DIAGNOSIS — Z8719 Personal history of other diseases of the digestive system: Secondary | ICD-10-CM | POA: Insufficient documentation

## 2019-12-16 DIAGNOSIS — F17210 Nicotine dependence, cigarettes, uncomplicated: Secondary | ICD-10-CM | POA: Diagnosis not present

## 2019-12-16 DIAGNOSIS — X58XXXA Exposure to other specified factors, initial encounter: Secondary | ICD-10-CM | POA: Diagnosis not present

## 2019-12-16 DIAGNOSIS — Z8249 Family history of ischemic heart disease and other diseases of the circulatory system: Secondary | ICD-10-CM | POA: Insufficient documentation

## 2019-12-16 DIAGNOSIS — Z1812 Retained nonmagnetic metal fragments: Secondary | ICD-10-CM

## 2019-12-16 DIAGNOSIS — R195 Other fecal abnormalities: Secondary | ICD-10-CM

## 2019-12-16 DIAGNOSIS — K449 Diaphragmatic hernia without obstruction or gangrene: Secondary | ICD-10-CM | POA: Diagnosis not present

## 2019-12-16 DIAGNOSIS — H9319 Tinnitus, unspecified ear: Secondary | ICD-10-CM | POA: Diagnosis not present

## 2019-12-16 DIAGNOSIS — M1990 Unspecified osteoarthritis, unspecified site: Secondary | ICD-10-CM | POA: Diagnosis not present

## 2019-12-16 DIAGNOSIS — T183XXA Foreign body in small intestine, initial encounter: Secondary | ICD-10-CM | POA: Diagnosis not present

## 2019-12-16 DIAGNOSIS — D509 Iron deficiency anemia, unspecified: Secondary | ICD-10-CM | POA: Insufficient documentation

## 2019-12-16 DIAGNOSIS — Z888 Allergy status to other drugs, medicaments and biological substances status: Secondary | ICD-10-CM | POA: Insufficient documentation

## 2019-12-16 DIAGNOSIS — T189XXS Foreign body of alimentary tract, part unspecified, sequela: Secondary | ICD-10-CM

## 2019-12-16 HISTORY — PX: ESOPHAGOGASTRODUODENOSCOPY (EGD) WITH PROPOFOL: SHX5813

## 2019-12-16 SURGERY — ESOPHAGOGASTRODUODENOSCOPY (EGD) WITH PROPOFOL
Anesthesia: Monitor Anesthesia Care

## 2019-12-16 MED ORDER — PROPOFOL 10 MG/ML IV BOLUS
INTRAVENOUS | Status: AC
  Filled 2019-12-16: qty 20

## 2019-12-16 MED ORDER — PROPOFOL 10 MG/ML IV BOLUS
INTRAVENOUS | Status: DC | PRN
  Administered 2019-12-16: 20 mg via INTRAVENOUS
  Administered 2019-12-16: 30 mg via INTRAVENOUS

## 2019-12-16 MED ORDER — PROPOFOL 500 MG/50ML IV EMUL
INTRAVENOUS | Status: DC | PRN
  Administered 2019-12-16: 150 ug/kg/min via INTRAVENOUS

## 2019-12-16 MED ORDER — LACTATED RINGERS IV SOLN
INTRAVENOUS | Status: DC
  Administered 2019-12-16: 1000 mL via INTRAVENOUS

## 2019-12-16 MED ORDER — LIDOCAINE 2% (20 MG/ML) 5 ML SYRINGE
INTRAMUSCULAR | Status: DC | PRN
  Administered 2019-12-16: 100 mg via INTRAVENOUS

## 2019-12-16 MED ORDER — SODIUM CHLORIDE 0.9 % IV SOLN: INTRAVENOUS | Status: DC

## 2019-12-16 MED ORDER — PROPOFOL 500 MG/50ML IV EMUL
INTRAVENOUS | Status: AC
  Filled 2019-12-16: qty 100

## 2019-12-16 SURGICAL SUPPLY — 15 items

## 2019-12-16 NOTE — Transfer of Care (Signed)
Immediate Anesthesia Transfer of Care Note  Patient: Johnny [name]  Procedure(s) Performed: ESOPHAGOGASTRODUODENOSCOPY (EGD) WITH PROPOFOL (N/A )  Patient Location: PACU and Endoscopy Unit  Anesthesia Type:MAC  Level of Consciousness: awake, alert  and patient cooperative  Airway & Oxygen Therapy: Patient Spontanous Breathing and Patient connected to nasal cannula oxygen  Post-op Assessment: Report given to RN and Post -op Vital signs reviewed and stable  Post vital signs: Reviewed and stable  Last Vitals:  Vitals Value Taken Time  BP    Temp    Pulse    Resp    SpO2      Last Pain:  Vitals:   12/16/19 0713  TempSrc: Oral  PainSc: 0-No pain         Complications: No apparent anesthesia complications

## 2019-12-16 NOTE — Anesthesia Postprocedure Evaluation (Signed)
Anesthesia Post Note  Patient: Johnny [name]  Procedure(s) Performed: ESOPHAGOGASTRODUODENOSCOPY (EGD) WITH PROPOFOL (N/A )     Patient location during evaluation: PACU Anesthesia Type: MAC Level of consciousness: awake and alert Pain management: pain level controlled Vital Signs Assessment: post-procedure vital signs reviewed and stable Respiratory status: spontaneous breathing, nonlabored ventilation, respiratory function stable and patient connected to nasal cannula oxygen Cardiovascular status: stable and blood pressure returned to baseline Postop Assessment: no apparent nausea or vomiting Anesthetic complications: no    Last Vitals:  Vitals:   12/16/19 0840 12/16/19 0856  BP: (!) 140/99 (!) 164/98  Pulse: 83 77  Resp: 20 17  Temp:    SpO2: 98% 96%    Last Pain:  Vitals:   12/16/19 0856  TempSrc:   PainSc: 0-No pain                 Raynee Mccasland DAVID

## 2019-12-16 NOTE — Interval H&P Note (Signed)
History and Physical Interval Note:  12/16/2019 7:34 AM  Johnny [name]  has presented today for surgery, with the diagnosis of foreign body removal.  The various methods of treatment have been discussed with the patient and family. After consideration of risks, benefits and other options for treatment, the patient has consented to  Procedure(s): ESOPHAGOGASTRODUODENOSCOPY (EGD) WITH PROPOFOL (N/A) as a surgical intervention.  The patient's history has been reviewed, patient examined, no change in status, stable for surgery.  I have reviewed the patient's chart and labs.  Questions were answered to the patient's satisfaction.     Rachael Fee

## 2019-12-16 NOTE — Telephone Encounter (Signed)
-----   Message from Rachael Fee, MD sent at 12/16/2019  8:27 AM EDT ----- He needs CBC, ferritin in 2 months.  Thanks Please remind him that he needs to be taking OTC iron once daily, indefinitely.  Thanks

## 2019-12-16 NOTE — Telephone Encounter (Signed)
Left message on machine to call back lab order entered  

## 2019-12-16 NOTE — Discharge Instructions (Signed)
YOU HAD AN ENDOSCOPIC PROCEDURE TODAY: Refer to the procedure report and other information in the discharge instructions given to you for any specific questions about what was found during the examination. If this information does not answer your questions, please call [place] office at [phone] to clarify.  ° °YOU SHOULD EXPECT: Some feelings of bloating in the abdomen. Passage of more gas than usual. Walking can help get rid of the air that was put into your GI tract during the procedure and reduce the bloating. If you had a lower endoscopy (such as a colonoscopy or flexible sigmoidoscopy) you may notice spotting of blood in your stool or on the toilet paper. Some abdominal soreness may be present for a day or two, also. ° °DIET: Your first meal following the procedure should be a light meal and then it is ok to progress to your normal diet. A half-sandwich or bowl of soup is an example of a good first meal. Heavy or fried foods are harder to digest and may make you feel nauseous or bloated. Drink plenty of fluids but you should avoid alcoholic beverages for 24 hours. If you had a esophageal dilation, please see attached instructions for diet.   ° °ACTIVITY: Your care partner should take you home directly after the procedure. You should plan to take it easy, moving slowly for the rest of the day. You can resume normal activity the day after the procedure however YOU SHOULD NOT DRIVE, use power tools, machinery or perform tasks that involve climbing or major physical exertion for 24 hours (because of the sedation medicines used during the test).  ° °SYMPTOMS TO REPORT IMMEDIATELY: °A gastroenterologist can be reached at any hour. Please call [phone]  for any of the following symptoms:  °Following lower endoscopy (colonoscopy, flexible sigmoidoscopy) °Excessive amounts of blood in the stool  °Significant tenderness, worsening of abdominal pains  °Swelling of the abdomen that is new, acute  °Fever of 100° or  higher  °Following upper endoscopy (EGD, EUS, ERCP, esophageal dilation) °Vomiting of blood or coffee ground material  °New, significant abdominal pain  °New, significant chest pain or pain under the shoulder blades  °Painful or persistently difficult swallowing  °New shortness of breath  °Black, tarry-looking or red, bloody stools ° °FOLLOW UP:  °If any biopsies were taken you will be contacted by phone or by letter within the next 1-3 weeks. Call [phone]  if you have not heard about the biopsies in 3 weeks.  °Please also call with any specific questions about appointments or follow up tests. ° °

## 2019-12-16 NOTE — Telephone Encounter (Signed)
The patient has been notified of this information and all questions answered.

## 2019-12-16 NOTE — Anesthesia Preprocedure Evaluation (Signed)
Anesthesia Evaluation  Patient identified by MRN, date of birth, ID band Patient awake    Reviewed: Allergy & Precautions, NPO status , Patient's Chart, lab work & pertinent test results  Airway Mallampati: I  TM Distance: >3 FB Neck ROM: Full    Dental   Pulmonary Current Smoker and Patient abstained from smoking.,    Pulmonary exam normal        Cardiovascular hypertension, Pt. on medications Normal cardiovascular exam     Neuro/Psych    GI/Hepatic GERD  Medicated and Controlled,  Endo/Other    Renal/GU      Musculoskeletal   Abdominal   Peds  Hematology   Anesthesia Other Findings   Reproductive/Obstetrics                             Anesthesia Physical Anesthesia Plan  ASA: III  Anesthesia Plan: MAC   Post-op Pain Management:    Induction: Intravenous  PONV Risk Score and Plan:   Airway Management Planned: Nasal Cannula  Additional Equipment:   Intra-op Plan:   Post-operative Plan:   Informed Consent: I have reviewed the patients History and Physical, chart, labs and discussed the procedure including the risks, benefits and alternatives for the proposed anesthesia with the patient or authorized representative who has indicated his/her understanding and acceptance.       Plan Discussed with: CRNA and Surgeon  Anesthesia Plan Comments:         Anesthesia Quick Evaluation

## 2019-12-16 NOTE — Anesthesia Procedure Notes (Signed)
Procedure Name: MAC Date/Time: 12/16/2019 7:43 AM Performed by: Lollie Sails, CRNA Pre-anesthesia Checklist: Patient identified, Emergency Drugs available, Suction available, Patient being monitored and Timeout performed Oxygen Delivery Method: Nasal cannula

## 2019-12-16 NOTE — Op Note (Addendum)
[facility] Patient Name: Johnny [name] Procedure Date: 12/16/2019 MRN: [id number] Attending MD: Rachael Fee , MD Date of Birth: 12-19-1955 CSN: [id number] Age: 64 Admit Type: Outpatient Procedure:                Upper GI endoscopy Indications:              Iron deficiency anemia; foreign body in duodenal                            bulb, see EGD report from 11/2019 Providers:                Rachael Fee, MD, Wanita Chamberlain, Technician,                            Tillie Fantasia, RN, Rogue Jury, RN Referring MD:              Medicines:                Monitored Anesthesia Care Complications:            No immediate complications. Estimated blood loss:                            None. Estimated Blood Loss:     Estimated blood loss: none. Procedure:                Pre-Anesthesia Assessment:                           - Prior to the procedure, a History and Physical                            was performed, and patient medications and                            allergies were reviewed. The patient's tolerance of                            previous anesthesia was also reviewed. The risks                            and benefits of the procedure and the sedation                            options and risks were discussed with the patient.                            All questions were answered, and informed consent                            was obtained. Prior Anticoagulants: The patient has                            taken no previous anticoagulant or antiplatelet  agents. ASA Grade Assessment: III - A patient with                            severe systemic disease. After reviewing the risks                            and benefits, the patient was deemed in                            satisfactory condition to undergo the procedure.                           After obtaining informed consent, the endoscope was                            passed  under direct vision. Throughout the                            procedure, the patient's blood pressure, pulse, and                            oxygen saturations were monitored continuously. The                            GIF-H190 ([id number] Olympus gastroscope was                            introduced through the mouth, and advanced to the                            second part of duodenum. The upper GI endoscopy was                            accomplished without difficulty. The patient                            tolerated the procedure well. Scope In: Scope Out: Findings:      A foreign body was found in the duodenal bulb. I previously felt this       was an interventional radiology coil device (placed several years ago       when he was acutely bleeding from a duodenal ulcer, GDA bleed) tangled       with a (swallowed?) piece of jewelry or fabric. Today it is much more       obvious that the entire foreign body is metal coil. It is causing two       very small, clean based foreign body type ulcers. I had IR Dr. Denny LevySchick in       the room with me this morning and we agreed to the above and also agreed       it is potentially quite dangerous to try to pull the device out. Our       endoscopic scissor device will not cut throug metal and I would also be       nervous attempting to cut the coil would result in sharp  edges, tips       that would be more dangerous than simply leaving the coil alone. Dr.       Reesa Chew agreed.      Small hiatal hernia.      The exam was otherwise without abnormality. Impression:               - A foreign body was found in the duodenal bulb. I                            felt this was an interventional radiology coil                            device (placed several years ago when he was                            acutely bleeding from a duodenal ulcer, GDA bleed)                            tangled with a (swallowed?) piece of jewelry or                             fabric. Today it is much more obvious that the                            entire foreign body is metal coil. It is causing                            two very small, clean based foreign body type                            ulcers. I had IR Dr. Reesa Chew in the room with me                            this morning and we agreed to the above and also                            agreed it is potentially quite dangerous to try to                            pull the device out. Our endoscopic scissor device                            will not cut throug metal and I would also be                            nervous attempting to cut the coil would result in                            sharp edges, tips that would be more dangerous than  simply leaving the coil alone. Dr. Denny Levy agreed.                           - I offered the patient referral for a second                            opinion (in [place] or at nearby tertiary facility)                            since this is not a very common situation. He                            declined, but knows if he changes his mind he can                            let me know any time.                           - Small hiatal hernia. Moderate Sedation:      Not Applicable - Patient had care per Anesthesia. Recommendation:           - Patient has a contact number available for                            emergencies. The signs and symptoms of potential                            delayed complications were discussed with the                            patient. Return to normal activities tomorrow.                            Written discharge instructions were provided to the                            patient.                           - Resume previous diet.                           - Continue present medications.                           - He will continue once daily OTC oral iron,                            indefinitely and we will  follow his blood counts                            serially (starting with CBC in 2 months, my office  will arrange). Procedure Code(s):        --- Professional ---                           [phone], Esophagogastroduodenoscopy, flexible,                            transoral; diagnostic, including collection of                            specimen(s) by brushing or washing, when performed                            (separate procedure) Diagnosis Code(s):        --- Professional ---                           T18.3XXS, Foreign body in small intestine, sequela                           D50.9, Iron deficiency anemia, unspecified CPT copyright 2019 American Medical Association. All rights reserved. The codes documented in this report are preliminary and upon coder review may  be revised to meet current compliance requirements. Rachael Fee, MD 12/16/2019 8:25:49 AM This report has been signed electronically. Number of Addenda: 0

## 2019-12-21 ENCOUNTER — Encounter: Payer: Self-pay | Admitting: *Deleted

## 2020-02-18 ENCOUNTER — Telehealth: Payer: Self-pay | Admitting: Physician Assistant

## 2020-02-18 NOTE — Telephone Encounter (Signed)
Dr Milagros Evener with this very kind patient. He is not having any pain in his stomach presently. He is concerned about the "thing in my gut" that was seen during the endoscopy 12/16/19. He and his family are worried and ready to go see someone about "helping get this taken care of."  He understands that you cannot remove this for him. He would like the referral to whomever you recommend. He will be seeing a new PCP on Monday. He will come to [facility] lab if the new PCP does not have any labs done. He is due the follow up labs for his anemia. Denies any overt blood in stools. He has been off the oral iron because "I ran out of money and couldn't buy it. But I just started it back."  Please advise on the referral.

## 2020-02-20 NOTE — Telephone Encounter (Signed)
Please apologize to him, I thought it was very clear that it was potentially dangerous to remove the foreign body (IR coil device) and that it was probably safest to simply leave it alone.  I am happy to discuss this with him again, in person.  Please offer my next available OV.  Thanks

## 2020-02-21 NOTE — Telephone Encounter (Signed)
Can he bring a family member in with him?

## 2020-02-21 NOTE — Telephone Encounter (Signed)
Patient scheduled.  03/01/20 at 3:40 pm. Arrive at 3:30 pm

## 2020-03-01 ENCOUNTER — Encounter: Payer: Self-pay | Admitting: Gastroenterology

## 2020-03-01 ENCOUNTER — Ambulatory Visit (INDEPENDENT_AMBULATORY_CARE_PROVIDER_SITE_OTHER): Payer: Medicare HMO | Admitting: Gastroenterology

## 2020-03-01 VITALS — BP 126/74 | HR 85 | Ht 74.0 in | Wt 222.0 lb

## 2020-03-01 DIAGNOSIS — D509 Iron deficiency anemia, unspecified: Secondary | ICD-10-CM | POA: Diagnosis not present

## 2020-03-01 MED ORDER — FERROUS SULFATE 325 (65 FE) MG PO TBEC: 325.0000 mg | DELAYED_RELEASE_TABLET | Freq: Every day | ORAL | Status: AC

## 2020-03-01 NOTE — Patient Instructions (Addendum)
If you are age 67 or older, your body mass index should be between 23-30. Your Body mass index is 28.5 kg/m. If this is out of the aforementioned range listed, please consider follow up with your Primary Care Provider.  If you are age 23 or younger, your body mass index should be between 19-25. Your Body mass index is 28.5 kg/m. If this is out of the aformentioned range listed, please consider follow up with your Primary Care Provider.   Please take Iron (ferrous sulfate) one tablet daily.  Your provider has requested that you go to the basement level for lab work in 2 months (Sept 2021). Press "B" on the elevator. The lab is located at the first door on the left as you exit the elevator.   Due to recent changes in healthcare laws, you may see the results of your imaging and laboratory studies on MyChart before your provider has had a chance to review them.  We understand that in some cases there may be results that are confusing or concerning to you. Not all laboratory results come back in the same time frame and the provider may be waiting for multiple results in order to interpret others.  Please give [place] 48 hours in order for your provider to thoroughly review all the results before contacting the office for clarification of your results.   Thank you for entrusting me with your care and choosing [facility].  Dr Christella Hartigan

## 2020-03-01 NOTE — Addendum Note (Signed)
Addended by: Lamona Curl on: 03/01/2020 03:56 PM   Modules accepted: Orders

## 2020-03-01 NOTE — Progress Notes (Signed)
Review of pertinent gastrointestinal problems: 1.  Routine risk for colon cancer, colonoscopy April 2021 found no polyps or precancerous lesions. 2.  Severe acute GI bleed from duodenal ulcer January 2018.  EGD showed a large actively bleeding duodenal ulcer.  Bleeding recurred and required interventional radiology gastroduodenal artery coiling ("microcoil embolization") January 2018.  EGD 2021 for iron deficiency anemia showed that some of the coil had looked its way into the lumen of the duodenum.  See images from April and May 2021 EGD.  In room consultation with interventional radiology agreed with this assessment.  We both agree that it would be potentially quite dangerous to remove the coil and advised the patient to leave it in place.  There was some small foreign body type ulceration where the coil was passing through the wall of the duodenum felt to be likely contributing to his iron deficiency anemia and he was told to stay on iron once daily.   HPI: This is a very pleasant 64 year old man who is here with a family member today.  He is sitting in a wheelchair.  He does not get around very well, generally walks with a walker since his spinal injury 25 years ago.  He had some concerns about the foreign body in his duodenum.  He has not been taking iron regularly since I saw him last however 2 weeks ago he did start taking it once every day.  Since he started back on iron his stools are a bit dark.  Prior to that they were not dark at all.  He has intermittent abdominal pains, no overt GI bleeding.  His weight is overall stable   ROS: complete GI ROS as described in HPI, all other review negative.  Constitutional:  No unintentional weight loss   Past Medical History:  Diagnosis Date   Alcoholism (HCC)    Arthritis    Broken neck (HCC)    Drug abuse (HCC)    crack   GERD (gastroesophageal reflux disease)    H/O tinnitus    Hydrocele    Hypertension    no meds   Loose,  teeth     Past Surgical History:  Procedure Laterality Date   COLONOSCOPY WITH PROPOFOL N/A 11/18/2019   Procedure: COLONOSCOPY WITH PROPOFOL;  Surgeon: Rachael Fee, MD;  Location: [facility] END[facility];  Service: Endoscopy;  Laterality: N/A;  patient in a wheelchair   ESOPHAGOGASTRODUODENOSCOPY N/A 08/11/2016   Procedure: ESOPHAGOGASTRODUODENOSCOPY (EGD);  Surgeon: Iva Boop, MD;  Location: [facility] ENDOSCOPY;  Service: Endoscopy;  Laterality: N/A;  In ICU   ESOPHAGOGASTRODUODENOSCOPY (EGD) WITH PROPOFOL N/A 11/18/2019   Procedure: ESOPHAGOGASTRODUODENOSCOPY (EGD) WITH PROPOFOL;  Surgeon: Rachael Fee, MD;  Location: [facility] ENDOSCOPY;  Service: Endoscopy;  Laterality: N/A;   ESOPHAGOGASTRODUODENOSCOPY (EGD) WITH PROPOFOL N/A 12/16/2019   Procedure: ESOPHAGOGASTRODUODENOSCOPY (EGD) WITH PROPOFOL;  Surgeon: Rachael Fee, MD;  Location: [facility] ENDOSCOPY;  Service: Endoscopy;  Laterality: N/A;   HEMORRHOID SURGERY     as a teenager   HYDROCELE EXCISION Right 04/15/2014   Procedure: RIGHT HYDROCELECTOMY ADULT;  Surgeon: Crist Fat, MD;  Location: [facility];  Service: Urology;  Laterality: Right;   IR GENERIC HISTORICAL  08/14/2016   IR ANGIOGRAM SELECTIVE EACH ADDITIONAL VESSEL 08/14/2016 Berdine Dance, MD [facility]   IR GENERIC HISTORICAL  08/14/2016   IR US GUIDE VASC ACCESS RIGHT 08/14/2016 Berdine Dance, MD [facility]   IR GENERIC HISTORICAL  08/14/2016   IR ANGIOGRAM SELECTIVE EACH ADDITIONAL VESSEL 08/14/2016  Berdine Dance, MD [facility]   IR GENERIC HISTORICAL  08/14/2016   IR ANGIOGRAM FOLLOW UP STUDY 08/14/2016 Berdine Dance, MD [facility]   IR GENERIC HISTORICAL  08/14/2016   IR ANGIOGRAM VISCERAL SELECTIVE 08/14/2016 Berdine Dance, MD [facility]   IR GENERIC HISTORICAL  08/14/2016   IR EMBO ART  VEN HEMORR LYMPH EXTRAV  INC GUIDE ROADMAPPING 08/14/2016 Berdine Dance, MD [facility]   NECK SURGERY  1996   TESTICLE SURGERY      Current  Outpatient Medications  Medication Sig Dispense Refill   allopurinol (ZYLOPRIM) 100 MG tablet Take 100 mg by mouth daily.      amLODipine (NORVASC) 10 MG tablet Take 10 mg by mouth daily.      ferrous sulfate 325 (65 FE) MG EC tablet Take 325 mg by mouth 2 (two) times daily.      gabapentin (NEURONTIN) 300 MG capsule Take 300 mg by mouth 3 (three) times daily.      HYDROcodone-acetaminophen (NORCO/VICODIN) 5-325 MG tablet Take 1-2 tablets by mouth every 6 (six) hours as needed for moderate pain or severe pain. (Patient taking differently: Take 1 tablet by mouth every 8 (eight) hours as needed for moderate pain or severe pain. ) 15 tablet 0   pantoprazole (PROTONIX) 40 MG tablet      No current facility-administered medications for this visit.    Allergies as of 03/01/2020 - Review Complete 03/01/2020  Allergen Reaction Noted   Ketamine Other (See Comments) 08/10/2016    Family History  Problem Relation Age of Onset   Colon cancer Paternal Uncle    Heart disease Mother        father    Social History   Socioeconomic History   Marital status: Divorced    Spouse name: Not on file   Number of children: Not on file   Years of education: Not on file   Highest education level: Not on file  Occupational History   Not on file  Tobacco Use   Smoking status: Current Some Day Smoker    Packs/day: 0.25    Types: Cigarettes   Smokeless tobacco: Never Used  Vaping Use   Vaping Use: Never used  Substance and Sexual Activity   Alcohol use: Yes    Comment: 40 oz beer daily   Drug use: Yes    Types: Cocaine    Comment: "crack"   Sexual activity: Not on file  Other Topics Concern   Not on file  Social History Narrative   Not on file   Social Determinants of Health   Financial Resource Strain:    Difficulty of Paying Living Expenses:   Food Insecurity:    Worried About Programme researcher, broadcasting/film/video in the Last Year:    Barista in the Last Year:    Transportation Needs:    Freight forwarder (Medical):    Lack of Transportation (Non-Medical):   Physical Activity:    Days of Exercise per Week:    Minutes of Exercise per Session:   Stress:    Feeling of Stress :   Social Connections:    Frequency of Communication with Friends and Family:    Frequency of Social Gatherings with Friends and Family:    Attends Religious Services:    Active Member of Clubs or Organizations:    Attends Banker Meetings:    Marital Status:   Intimate Partner Violence:    Fear of Current or Ex-Partner:  Emotionally Abused:    Physically Abused:    Sexually Abused:      Physical Exam: BP 126/74    Pulse 85    Ht 6\' 2"  (1.88 m)    Wt (!) 222 lb (100.7 kg)    BMI 28.50 kg/m  Constitutional: generally well-appearing Psychiatric: alert and oriented x3 Abdomen: soft, nontender, nondistended, no obvious ascites, no peritoneal signs, normal bowel sounds No peripheral edema noted in lower extremities  Assessment and plan: 64 y.o. male with foreign body in duodenum, mild iron deficiency anemia  First we had another good discussion about the interventional radiology coil device that is extending from his GDA into his duodenal lumen.  I explained to him again very clearly that it is safest to let this device be.  I have had discussions with interventional radiology about it and we agree that it would be potentially very dangerous to try to remove it.  At worst it is currently causing her contributing to his mild iron deficiency anemia.  I explained to him that the best solution is that he take iron 1 pill once daily indefinitely for now.  He seems to respond pretty scattered about his medicines does not take them very regularly but I impressed upon him that that would be the best solution here.  He is going to have a CBC rechecked in 2 months and he will try to continue taking iron 1 pill once daily.  Please see the "Patient  Instructions" section for addition details about the plan.  69, MD [facility] 03/01/2020, 3:41 PM   Total time on date of encounter was 25 minutes (this included time spent preparing to see the patient reviewing records; obtaining and/or reviewing separately obtained history; performing a medically appropriate exam and/or evaluation; counseling and educating the patient and family if present; ordering medications, tests or procedures if applicable; and documenting clinical information in the health record).

## 2020-06-14 ENCOUNTER — Ambulatory Visit: Payer: Medicare HMO | Admitting: Podiatry

## 2021-03-29 ENCOUNTER — Telehealth: Payer: Self-pay | Admitting: *Deleted

## 2021-03-29 NOTE — Telephone Encounter (Signed)
Patient has painful  toenail that is lifting from nail bed, almost off,scheduled for Friday 04/06/21,please schedule for a sooner appointment asap

## 2021-04-03 ENCOUNTER — Telehealth: Payer: Self-pay | Admitting: *Deleted

## 2021-04-03 NOTE — Telephone Encounter (Signed)
Note completed 

## 2021-04-06 ENCOUNTER — Other Ambulatory Visit: Payer: Self-pay

## 2021-04-06 ENCOUNTER — Ambulatory Visit (INDEPENDENT_AMBULATORY_CARE_PROVIDER_SITE_OTHER): Payer: Medicare HMO | Admitting: Podiatry

## 2021-04-06 DIAGNOSIS — B351 Tinea unguium: Secondary | ICD-10-CM | POA: Diagnosis not present

## 2021-04-06 DIAGNOSIS — M79674 Pain in right toe(s): Secondary | ICD-10-CM

## 2021-04-06 DIAGNOSIS — M79675 Pain in left toe(s): Secondary | ICD-10-CM

## 2021-04-09 NOTE — Progress Notes (Signed)
Subjective:   Patient ID: Johnny [name], male   DOB: 65 y.o.   MRN: [id number]   HPI Patient presents with very thickened dystrophic nailbeds of both feet that he cannot cut they become bothersome with shoe gear and they have grown quite long and irritated.  He also has with caregiver who cannot provide this service for   Review of Systems  All other systems reviewed and are negative.      Objective:  Physical Exam Vitals and nursing note reviewed.  Constitutional:      Appearance: He is well-developed.  Pulmonary:     Effort: Pulmonary effort is normal.  Musculoskeletal:        General: Normal range of motion.  Skin:    General: Skin is warm.  Neurological:     Mental Status: He is alert.    Neurovascular status found to be intact with patient found to have good digital perfusion well oriented with adequate muscle strength range of motion of the subtalar midtarsal joint.  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  I went ahead today and accomplished deep debridement of nailbeds 1-5 both feet no iatrogenic bleeding and patient will be seen back to recheck for routine care and is instructed on daily inspections of beds

## 2022-03-11 ENCOUNTER — Other Ambulatory Visit: Payer: Self-pay | Admitting: Student

## 2022-03-11 DIAGNOSIS — F17210 Nicotine dependence, cigarettes, uncomplicated: Secondary | ICD-10-CM

## 2022-03-22 ENCOUNTER — Inpatient Hospital Stay: Admission: RE | Admit: 2022-03-22 | Payer: Medicare HMO | Source: Ambulatory Visit
